# Patient Record
Sex: Male | Born: 1935 | Race: White | Hispanic: No | Smoking: Never smoker
Health system: Southern US, Community
[De-identification: ages and names within clinical notes are randomized; demographics above are authoritative.]

## PROBLEM LIST (undated history)

## (undated) DIAGNOSIS — N200 Calculus of kidney: Secondary | ICD-10-CM

## (undated) DIAGNOSIS — H9113 Presbycusis, bilateral: Secondary | ICD-10-CM

## (undated) DIAGNOSIS — I4891 Unspecified atrial fibrillation: Secondary | ICD-10-CM

## (undated) DIAGNOSIS — I509 Heart failure, unspecified: Secondary | ICD-10-CM

## (undated) DIAGNOSIS — I10 Essential (primary) hypertension: Secondary | ICD-10-CM

## (undated) DIAGNOSIS — E785 Hyperlipidemia, unspecified: Secondary | ICD-10-CM

## (undated) DIAGNOSIS — E079 Disorder of thyroid, unspecified: Secondary | ICD-10-CM

## (undated) HISTORY — DX: Disorder of thyroid, unspecified: E07.9

## (undated) HISTORY — PX: HAND ARTHROPLASTY: SHX968

## (undated) HISTORY — PX: HIP ARTHROPLASTY: SHX981

## (undated) HISTORY — DX: Unspecified atrial fibrillation: I48.91

## (undated) HISTORY — DX: Presbycusis, bilateral: H91.13

## (undated) HISTORY — DX: Hyperlipidemia, unspecified: E78.5

## (undated) HISTORY — DX: Heart failure, unspecified: I50.9

## (undated) HISTORY — PX: STENT PLACEMENT RT URETER (ARMC HX): HXRAD1255

## (undated) HISTORY — DX: Essential (primary) hypertension: I10

## (undated) HISTORY — DX: Calculus of kidney: N20.0

---

## 2005-09-14 ENCOUNTER — Other Ambulatory Visit: Payer: Self-pay

## 2005-09-14 ENCOUNTER — Emergency Department: Payer: Self-pay | Admitting: Emergency Medicine

## 2008-02-14 ENCOUNTER — Other Ambulatory Visit: Payer: Self-pay

## 2008-02-14 ENCOUNTER — Ambulatory Visit: Payer: Self-pay | Admitting: Specialist

## 2008-02-20 ENCOUNTER — Ambulatory Visit: Payer: Self-pay | Admitting: Specialist

## 2008-06-25 ENCOUNTER — Ambulatory Visit: Payer: Self-pay | Admitting: Specialist

## 2009-05-20 ENCOUNTER — Ambulatory Visit: Payer: Self-pay | Admitting: Family Medicine

## 2009-08-05 ENCOUNTER — Ambulatory Visit: Payer: Self-pay | Admitting: Specialist

## 2009-08-13 ENCOUNTER — Inpatient Hospital Stay: Payer: Self-pay | Admitting: Specialist

## 2009-09-02 ENCOUNTER — Ambulatory Visit: Payer: Self-pay | Admitting: Oncology

## 2009-09-22 ENCOUNTER — Ambulatory Visit: Payer: Self-pay | Admitting: Internal Medicine

## 2009-09-23 ENCOUNTER — Inpatient Hospital Stay: Payer: Self-pay | Admitting: Internal Medicine

## 2009-09-30 ENCOUNTER — Ambulatory Visit: Payer: Self-pay | Admitting: Oncology

## 2011-06-06 ENCOUNTER — Emergency Department: Payer: Self-pay | Admitting: Internal Medicine

## 2011-10-07 DIAGNOSIS — I10 Essential (primary) hypertension: Secondary | ICD-10-CM | POA: Diagnosis not present

## 2011-10-07 DIAGNOSIS — M542 Cervicalgia: Secondary | ICD-10-CM | POA: Diagnosis not present

## 2011-10-07 DIAGNOSIS — E785 Hyperlipidemia, unspecified: Secondary | ICD-10-CM | POA: Diagnosis not present

## 2011-10-07 DIAGNOSIS — E039 Hypothyroidism, unspecified: Secondary | ICD-10-CM | POA: Diagnosis not present

## 2011-10-26 DIAGNOSIS — I2510 Atherosclerotic heart disease of native coronary artery without angina pectoris: Secondary | ICD-10-CM | POA: Diagnosis not present

## 2011-10-26 DIAGNOSIS — R011 Cardiac murmur, unspecified: Secondary | ICD-10-CM | POA: Diagnosis not present

## 2011-10-26 DIAGNOSIS — R0609 Other forms of dyspnea: Secondary | ICD-10-CM | POA: Diagnosis not present

## 2011-10-26 DIAGNOSIS — R0989 Other specified symptoms and signs involving the circulatory and respiratory systems: Secondary | ICD-10-CM | POA: Diagnosis not present

## 2011-11-10 DIAGNOSIS — H353190 Nonexudative age-related macular degeneration, unspecified eye, stage unspecified: Secondary | ICD-10-CM | POA: Diagnosis not present

## 2012-02-11 DIAGNOSIS — I10 Essential (primary) hypertension: Secondary | ICD-10-CM | POA: Diagnosis not present

## 2012-02-11 DIAGNOSIS — E039 Hypothyroidism, unspecified: Secondary | ICD-10-CM | POA: Diagnosis not present

## 2012-02-11 DIAGNOSIS — B356 Tinea cruris: Secondary | ICD-10-CM | POA: Diagnosis not present

## 2012-02-11 DIAGNOSIS — E7800 Pure hypercholesterolemia, unspecified: Secondary | ICD-10-CM | POA: Diagnosis not present

## 2012-04-11 DIAGNOSIS — H353190 Nonexudative age-related macular degeneration, unspecified eye, stage unspecified: Secondary | ICD-10-CM | POA: Diagnosis not present

## 2012-04-27 DIAGNOSIS — R011 Cardiac murmur, unspecified: Secondary | ICD-10-CM | POA: Diagnosis not present

## 2012-04-27 DIAGNOSIS — I4891 Unspecified atrial fibrillation: Secondary | ICD-10-CM | POA: Diagnosis not present

## 2012-04-27 DIAGNOSIS — I2510 Atherosclerotic heart disease of native coronary artery without angina pectoris: Secondary | ICD-10-CM | POA: Diagnosis not present

## 2012-06-13 DIAGNOSIS — E039 Hypothyroidism, unspecified: Secondary | ICD-10-CM | POA: Diagnosis not present

## 2012-06-13 DIAGNOSIS — Z23 Encounter for immunization: Secondary | ICD-10-CM | POA: Diagnosis not present

## 2012-06-14 DIAGNOSIS — E7800 Pure hypercholesterolemia, unspecified: Secondary | ICD-10-CM | POA: Diagnosis not present

## 2012-06-14 DIAGNOSIS — E039 Hypothyroidism, unspecified: Secondary | ICD-10-CM | POA: Diagnosis not present

## 2012-09-01 ENCOUNTER — Emergency Department: Payer: Self-pay | Admitting: Emergency Medicine

## 2012-09-01 DIAGNOSIS — Z79899 Other long term (current) drug therapy: Secondary | ICD-10-CM | POA: Diagnosis not present

## 2012-09-01 DIAGNOSIS — Z8249 Family history of ischemic heart disease and other diseases of the circulatory system: Secondary | ICD-10-CM | POA: Diagnosis not present

## 2012-09-01 DIAGNOSIS — I4891 Unspecified atrial fibrillation: Secondary | ICD-10-CM | POA: Diagnosis not present

## 2012-09-01 DIAGNOSIS — G43909 Migraine, unspecified, not intractable, without status migrainosus: Secondary | ICD-10-CM | POA: Diagnosis not present

## 2012-09-01 DIAGNOSIS — R51 Headache: Secondary | ICD-10-CM | POA: Diagnosis not present

## 2012-09-01 DIAGNOSIS — Z9889 Other specified postprocedural states: Secondary | ICD-10-CM | POA: Diagnosis not present

## 2012-09-01 DIAGNOSIS — I10 Essential (primary) hypertension: Secondary | ICD-10-CM | POA: Diagnosis not present

## 2012-09-13 DIAGNOSIS — R51 Headache: Secondary | ICD-10-CM | POA: Diagnosis not present

## 2012-09-13 DIAGNOSIS — I10 Essential (primary) hypertension: Secondary | ICD-10-CM | POA: Diagnosis not present

## 2012-09-13 DIAGNOSIS — M5412 Radiculopathy, cervical region: Secondary | ICD-10-CM | POA: Diagnosis not present

## 2012-09-20 ENCOUNTER — Emergency Department: Payer: Self-pay | Admitting: Internal Medicine

## 2012-09-20 DIAGNOSIS — I4891 Unspecified atrial fibrillation: Secondary | ICD-10-CM | POA: Diagnosis not present

## 2012-09-20 DIAGNOSIS — I2510 Atherosclerotic heart disease of native coronary artery without angina pectoris: Secondary | ICD-10-CM | POA: Diagnosis not present

## 2012-09-20 DIAGNOSIS — Z95818 Presence of other cardiac implants and grafts: Secondary | ICD-10-CM | POA: Diagnosis not present

## 2012-09-20 DIAGNOSIS — IMO0002 Reserved for concepts with insufficient information to code with codable children: Secondary | ICD-10-CM | POA: Diagnosis not present

## 2012-09-20 DIAGNOSIS — Z7982 Long term (current) use of aspirin: Secondary | ICD-10-CM | POA: Diagnosis not present

## 2012-09-20 DIAGNOSIS — M25559 Pain in unspecified hip: Secondary | ICD-10-CM | POA: Diagnosis not present

## 2012-09-20 DIAGNOSIS — I10 Essential (primary) hypertension: Secondary | ICD-10-CM | POA: Diagnosis not present

## 2012-09-20 DIAGNOSIS — E7800 Pure hypercholesterolemia, unspecified: Secondary | ICD-10-CM | POA: Diagnosis not present

## 2012-09-20 DIAGNOSIS — Z79899 Other long term (current) drug therapy: Secondary | ICD-10-CM | POA: Diagnosis not present

## 2012-09-20 DIAGNOSIS — Z9889 Other specified postprocedural states: Secondary | ICD-10-CM | POA: Diagnosis not present

## 2012-09-22 DIAGNOSIS — M461 Sacroiliitis, not elsewhere classified: Secondary | ICD-10-CM | POA: Diagnosis not present

## 2012-11-21 DIAGNOSIS — I4891 Unspecified atrial fibrillation: Secondary | ICD-10-CM | POA: Diagnosis not present

## 2012-11-21 DIAGNOSIS — I2510 Atherosclerotic heart disease of native coronary artery without angina pectoris: Secondary | ICD-10-CM | POA: Diagnosis not present

## 2012-11-21 DIAGNOSIS — R0609 Other forms of dyspnea: Secondary | ICD-10-CM | POA: Diagnosis not present

## 2013-04-19 DIAGNOSIS — I10 Essential (primary) hypertension: Secondary | ICD-10-CM | POA: Diagnosis not present

## 2013-04-19 DIAGNOSIS — E7800 Pure hypercholesterolemia, unspecified: Secondary | ICD-10-CM | POA: Diagnosis not present

## 2013-04-19 DIAGNOSIS — Z23 Encounter for immunization: Secondary | ICD-10-CM | POA: Diagnosis not present

## 2013-04-19 DIAGNOSIS — E039 Hypothyroidism, unspecified: Secondary | ICD-10-CM | POA: Diagnosis not present

## 2013-05-08 DIAGNOSIS — E7800 Pure hypercholesterolemia, unspecified: Secondary | ICD-10-CM | POA: Diagnosis not present

## 2013-05-08 DIAGNOSIS — E039 Hypothyroidism, unspecified: Secondary | ICD-10-CM | POA: Diagnosis not present

## 2013-05-31 DIAGNOSIS — I4891 Unspecified atrial fibrillation: Secondary | ICD-10-CM | POA: Diagnosis not present

## 2013-05-31 DIAGNOSIS — I10 Essential (primary) hypertension: Secondary | ICD-10-CM | POA: Diagnosis not present

## 2013-06-19 DIAGNOSIS — L989 Disorder of the skin and subcutaneous tissue, unspecified: Secondary | ICD-10-CM | POA: Diagnosis not present

## 2013-06-19 DIAGNOSIS — Z0000 Encounter for general adult medical examination without abnormal findings: Secondary | ICD-10-CM | POA: Diagnosis not present

## 2013-06-19 DIAGNOSIS — Z1211 Encounter for screening for malignant neoplasm of colon: Secondary | ICD-10-CM | POA: Diagnosis not present

## 2013-06-19 DIAGNOSIS — Z125 Encounter for screening for malignant neoplasm of prostate: Secondary | ICD-10-CM | POA: Diagnosis not present

## 2013-06-19 DIAGNOSIS — Z1331 Encounter for screening for depression: Secondary | ICD-10-CM | POA: Diagnosis not present

## 2013-08-20 DIAGNOSIS — I10 Essential (primary) hypertension: Secondary | ICD-10-CM | POA: Diagnosis not present

## 2013-08-20 DIAGNOSIS — E039 Hypothyroidism, unspecified: Secondary | ICD-10-CM | POA: Diagnosis not present

## 2013-08-20 DIAGNOSIS — I4891 Unspecified atrial fibrillation: Secondary | ICD-10-CM | POA: Diagnosis not present

## 2013-12-03 DIAGNOSIS — Z961 Presence of intraocular lens: Secondary | ICD-10-CM | POA: Diagnosis not present

## 2013-12-04 DIAGNOSIS — R011 Cardiac murmur, unspecified: Secondary | ICD-10-CM | POA: Diagnosis not present

## 2013-12-04 DIAGNOSIS — I2510 Atherosclerotic heart disease of native coronary artery without angina pectoris: Secondary | ICD-10-CM | POA: Diagnosis not present

## 2013-12-04 DIAGNOSIS — I209 Angina pectoris, unspecified: Secondary | ICD-10-CM | POA: Diagnosis not present

## 2013-12-04 DIAGNOSIS — R0609 Other forms of dyspnea: Secondary | ICD-10-CM | POA: Diagnosis not present

## 2013-12-04 DIAGNOSIS — R0989 Other specified symptoms and signs involving the circulatory and respiratory systems: Secondary | ICD-10-CM | POA: Diagnosis not present

## 2013-12-19 DIAGNOSIS — R0609 Other forms of dyspnea: Secondary | ICD-10-CM | POA: Diagnosis not present

## 2013-12-19 DIAGNOSIS — R0989 Other specified symptoms and signs involving the circulatory and respiratory systems: Secondary | ICD-10-CM | POA: Diagnosis not present

## 2013-12-19 DIAGNOSIS — R011 Cardiac murmur, unspecified: Secondary | ICD-10-CM | POA: Diagnosis not present

## 2014-01-07 DIAGNOSIS — M1990 Unspecified osteoarthritis, unspecified site: Secondary | ICD-10-CM | POA: Diagnosis not present

## 2014-01-07 DIAGNOSIS — Z9181 History of falling: Secondary | ICD-10-CM | POA: Diagnosis not present

## 2014-01-07 DIAGNOSIS — E039 Hypothyroidism, unspecified: Secondary | ICD-10-CM | POA: Diagnosis not present

## 2014-01-07 DIAGNOSIS — I10 Essential (primary) hypertension: Secondary | ICD-10-CM | POA: Diagnosis not present

## 2014-01-07 DIAGNOSIS — E7800 Pure hypercholesterolemia, unspecified: Secondary | ICD-10-CM | POA: Diagnosis not present

## 2014-04-15 DIAGNOSIS — Z9181 History of falling: Secondary | ICD-10-CM | POA: Diagnosis not present

## 2014-04-22 DIAGNOSIS — M7530 Calcific tendinitis of unspecified shoulder: Secondary | ICD-10-CM | POA: Diagnosis not present

## 2014-04-22 DIAGNOSIS — Z96649 Presence of unspecified artificial hip joint: Secondary | ICD-10-CM | POA: Diagnosis not present

## 2014-05-15 DIAGNOSIS — I10 Essential (primary) hypertension: Secondary | ICD-10-CM | POA: Diagnosis not present

## 2014-05-15 DIAGNOSIS — E039 Hypothyroidism, unspecified: Secondary | ICD-10-CM | POA: Diagnosis not present

## 2014-05-15 DIAGNOSIS — Z23 Encounter for immunization: Secondary | ICD-10-CM | POA: Diagnosis not present

## 2014-05-15 DIAGNOSIS — E780 Pure hypercholesterolemia: Secondary | ICD-10-CM | POA: Diagnosis not present

## 2014-06-04 DIAGNOSIS — H3530 Unspecified macular degeneration: Secondary | ICD-10-CM | POA: Diagnosis not present

## 2014-06-04 DIAGNOSIS — H1851 Endothelial corneal dystrophy: Secondary | ICD-10-CM | POA: Diagnosis not present

## 2014-07-24 ENCOUNTER — Emergency Department: Payer: Self-pay | Admitting: Emergency Medicine

## 2014-07-24 DIAGNOSIS — R0602 Shortness of breath: Secondary | ICD-10-CM | POA: Diagnosis not present

## 2014-07-24 DIAGNOSIS — I517 Cardiomegaly: Secondary | ICD-10-CM | POA: Diagnosis not present

## 2014-07-24 DIAGNOSIS — I10 Essential (primary) hypertension: Secondary | ICD-10-CM | POA: Diagnosis not present

## 2014-07-24 DIAGNOSIS — I509 Heart failure, unspecified: Secondary | ICD-10-CM | POA: Diagnosis not present

## 2014-07-24 DIAGNOSIS — R609 Edema, unspecified: Secondary | ICD-10-CM | POA: Diagnosis not present

## 2014-07-24 LAB — CBC
HCT: 39.5 % — ABNORMAL LOW (ref 40.0–52.0)
HGB: 12.5 g/dL — ABNORMAL LOW (ref 13.0–18.0)
MCH: 29 pg (ref 26.0–34.0)
MCHC: 31.7 g/dL — AB (ref 32.0–36.0)
MCV: 91 fL (ref 80–100)
Platelet: 216 10*3/uL (ref 150–440)
RBC: 4.33 10*6/uL — AB (ref 4.40–5.90)
RDW: 15.5 % — ABNORMAL HIGH (ref 11.5–14.5)
WBC: 9 10*3/uL (ref 3.8–10.6)

## 2014-07-24 LAB — BASIC METABOLIC PANEL
Anion Gap: 9 (ref 7–16)
BUN: 16 mg/dL (ref 7–18)
CALCIUM: 9 mg/dL (ref 8.5–10.1)
CO2: 26 mmol/L (ref 21–32)
Chloride: 98 mmol/L (ref 98–107)
Creatinine: 1.09 mg/dL (ref 0.60–1.30)
EGFR (Non-African Amer.): >60
Glucose: 137 mg/dL — ABNORMAL HIGH (ref 65–99)
Osmolality: 270 (ref 275–301)
POTASSIUM: 4.6 mmol/L (ref 3.5–5.1)
SODIUM: 133 mmol/L — AB (ref 136–145)

## 2014-07-24 LAB — TROPONIN I: Troponin-I: 0.04 ng/mL

## 2014-07-24 LAB — MAGNESIUM: MAGNESIUM: 1.7 mg/dL — AB

## 2014-07-30 DIAGNOSIS — Z8679 Personal history of other diseases of the circulatory system: Secondary | ICD-10-CM | POA: Diagnosis not present

## 2014-07-30 DIAGNOSIS — E669 Obesity, unspecified: Secondary | ICD-10-CM | POA: Diagnosis not present

## 2014-07-30 DIAGNOSIS — I509 Heart failure, unspecified: Secondary | ICD-10-CM | POA: Diagnosis not present

## 2014-07-30 DIAGNOSIS — I2510 Atherosclerotic heart disease of native coronary artery without angina pectoris: Secondary | ICD-10-CM | POA: Diagnosis not present

## 2014-08-06 DIAGNOSIS — I509 Heart failure, unspecified: Secondary | ICD-10-CM | POA: Diagnosis not present

## 2014-08-27 DIAGNOSIS — E784 Other hyperlipidemia: Secondary | ICD-10-CM | POA: Diagnosis not present

## 2014-08-27 DIAGNOSIS — I2510 Atherosclerotic heart disease of native coronary artery without angina pectoris: Secondary | ICD-10-CM | POA: Diagnosis not present

## 2014-08-27 DIAGNOSIS — R0602 Shortness of breath: Secondary | ICD-10-CM | POA: Diagnosis not present

## 2014-08-27 DIAGNOSIS — I5032 Chronic diastolic (congestive) heart failure: Secondary | ICD-10-CM | POA: Diagnosis not present

## 2014-09-26 DIAGNOSIS — E780 Pure hypercholesterolemia: Secondary | ICD-10-CM | POA: Diagnosis not present

## 2014-09-26 DIAGNOSIS — Z23 Encounter for immunization: Secondary | ICD-10-CM | POA: Diagnosis not present

## 2014-09-26 DIAGNOSIS — I4891 Unspecified atrial fibrillation: Secondary | ICD-10-CM | POA: Diagnosis not present

## 2014-09-26 DIAGNOSIS — E039 Hypothyroidism, unspecified: Secondary | ICD-10-CM | POA: Diagnosis not present

## 2014-09-26 DIAGNOSIS — I10 Essential (primary) hypertension: Secondary | ICD-10-CM | POA: Diagnosis not present

## 2014-12-05 DIAGNOSIS — H3531 Nonexudative age-related macular degeneration: Secondary | ICD-10-CM | POA: Diagnosis not present

## 2015-01-28 ENCOUNTER — Encounter: Payer: Self-pay | Admitting: Family Medicine

## 2015-01-28 ENCOUNTER — Ambulatory Visit (INDEPENDENT_AMBULATORY_CARE_PROVIDER_SITE_OTHER): Payer: Medicare Other | Admitting: Family Medicine

## 2015-01-28 VITALS — BP 130/80 | HR 72 | Temp 97.9°F | Resp 16 | Ht 67.0 in | Wt 198.5 lb

## 2015-01-28 DIAGNOSIS — I10 Essential (primary) hypertension: Secondary | ICD-10-CM | POA: Insufficient documentation

## 2015-01-28 DIAGNOSIS — R011 Cardiac murmur, unspecified: Secondary | ICD-10-CM | POA: Insufficient documentation

## 2015-01-28 DIAGNOSIS — I2510 Atherosclerotic heart disease of native coronary artery without angina pectoris: Secondary | ICD-10-CM | POA: Diagnosis not present

## 2015-01-28 DIAGNOSIS — E039 Hypothyroidism, unspecified: Secondary | ICD-10-CM | POA: Insufficient documentation

## 2015-01-28 DIAGNOSIS — I4819 Other persistent atrial fibrillation: Secondary | ICD-10-CM

## 2015-01-28 DIAGNOSIS — E785 Hyperlipidemia, unspecified: Secondary | ICD-10-CM | POA: Insufficient documentation

## 2015-01-28 DIAGNOSIS — I481 Persistent atrial fibrillation: Secondary | ICD-10-CM

## 2015-01-28 DIAGNOSIS — E079 Disorder of thyroid, unspecified: Secondary | ICD-10-CM | POA: Diagnosis not present

## 2015-01-28 NOTE — Patient Instructions (Signed)
F/U 4 MO 

## 2015-01-28 NOTE — Progress Notes (Signed)
Name: Kirk Sanchez   MRN: [id number]    DOB: [birth year]   Date:01/28/2015       Progress Note  Subjective  Chief Complaint  Chief Complaint  Patient presents with  . Hypertension  . Hyperlipidemia  . Hypothyroidism    Hypertension This is a chronic problem. The current episode started more than 1 year ago. The problem is unchanged. The problem is controlled. Pertinent negatives include no blurred vision, chest pain, headaches, neck pain, orthopnea, palpitations or shortness of breath. Risk factors for coronary artery disease include diabetes mellitus, dyslipidemia, male gender and sedentary lifestyle. Past treatments include beta blockers and calcium channel blockers. The current treatment provides moderate improvement. There are no compliance problems.  Hypertensive end-organ damage includes a thyroid problem.  Hyperlipidemia This is a chronic problem. The current episode started more than 1 year ago. The problem is controlled. Recent lipid tests were reviewed and are normal. Factors aggravating his hyperlipidemia include fatty foods. Pertinent negatives include no chest pain, focal weakness, myalgias or shortness of breath. Current antihyperlipidemic treatment includes statins. The current treatment provides moderate improvement of lipids. There are no compliance problems.  Risk factors for coronary artery disease include diabetes mellitus, dyslipidemia, hypertension, male sex, a sedentary lifestyle and obesity.  Thyroid Problem Presents for follow-up visit. Patient reports no anxiety, constipation, diarrhea, palpitations, tremors or weight loss. The symptoms have been stable. His past medical history is significant for hyperlipidemia.  Heart Problem This is a chronic (Has chronic atrial fibrillation with a controlled ventricular rate) problem. The current episode started more than 1 year ago. The problem occurs constantly. The problem has been unchanged. Associated symptoms include weakness.  Pertinent negatives include no chest pain, chills, congestion, coughing, fever, headaches, myalgias, nausea, neck pain, sore throat or vomiting. Nothing aggravates the symptoms. He has tried walking for the symptoms.      Past Medical History  Diagnosis Date  . Hypertension   . Hyperlipidemia   . Thyroid disease   . Atrial fibrillation   . Kidney stone     History  Substance Use Topics  . Smoking status: Never Smoker   . Smokeless tobacco: Not on file  . Alcohol Use: No     Current outpatient prescriptions:  .  atenolol (TENORMIN) 50 MG tablet, , Disp: , Rfl:  .  furosemide (LASIX) 20 MG tablet, Take by mouth., Disp: , Rfl:  .  levothyroxine (SYNTHROID, LEVOTHROID) 25 MCG tablet, Take by mouth., Disp: , Rfl:  .  simvastatin (ZOCOR) 40 MG tablet, Take by mouth., Disp: , Rfl:   No Known Allergies  Review of Systems  Constitutional: Negative for fever, chills and weight loss.  HENT: Negative for congestion, hearing loss, sore throat and tinnitus.   Eyes: Negative for blurred vision, double vision and redness.  Respiratory: Negative for cough, hemoptysis and shortness of breath.   Cardiovascular: Negative for chest pain, palpitations, orthopnea, claudication and leg swelling.       Irregular heart rate pain or shortness of breath no syncope or dizziness  Gastrointestinal: Negative for heartburn, nausea, vomiting, diarrhea, constipation and blood in stool.  Genitourinary: Negative for dysuria, urgency, frequency and hematuria.  Musculoskeletal: Positive for joint pain. Negative for myalgias, back pain, falls and neck pain.  Skin: Negative for itching.  Neurological: Positive for weakness. Negative for dizziness, tingling, tremors, focal weakness, seizures, loss of consciousness and headaches.  Endo/Heme/Allergies: Does not bruise/bleed easily.  Psychiatric/Behavioral: Negative for depression and substance abuse. The patient is not nervous/anxious  and does not have insomnia.       Objective  Filed Vitals:   01/28/15 1110  BP: 130/80  Pulse: 72  Temp: 97.9 F (36.6 C)  TempSrc: Oral  Resp: 16  Height:  (1.702 m)  Weight: 198 lb 8 oz (90.039 kg)  SpO2: 94%     Physical Exam  Constitutional: He is oriented to person, place, and time and well-developed, well-nourished, and in no distress.  HENT:  Head: Normocephalic.  Eyes: EOM are normal. Pupils are equal, round, and reactive to light.  Neck: Normal range of motion. Neck supple. No thyromegaly present.  Cardiovascular:  No murmur heard. Irregularly irregular rate and rhythm no murmur  Pulmonary/Chest: Effort normal and breath sounds normal. No respiratory distress. He has no wheezes. He exhibits no tenderness.  Abdominal: Soft. Bowel sounds are normal.  Musculoskeletal: Normal range of motion. He exhibits no edema.  Lymphadenopathy:    He has no cervical adenopathy.  Neurological: He is alert and oriented to person, place, and time. No cranial nerve deficit. Gait normal. Coordination normal.  Skin: Skin is warm and dry. No rash noted.  Psychiatric: Mood, affect and judgment normal.      Assessment & Plan  1. Essential hypertension Controlled  2. Arteriosclerosis of coronary artery Stable  3. Disease of thyroid gland Stable  4. Cardiac murmur Stable stable  5. HLD (hyperlipidemia) Stable  6. Persistent atrial fibrillation Asymptomatic

## 2015-02-24 DIAGNOSIS — R011 Cardiac murmur, unspecified: Secondary | ICD-10-CM | POA: Diagnosis not present

## 2015-02-24 DIAGNOSIS — E784 Other hyperlipidemia: Secondary | ICD-10-CM | POA: Diagnosis not present

## 2015-02-24 DIAGNOSIS — I10 Essential (primary) hypertension: Secondary | ICD-10-CM | POA: Diagnosis not present

## 2015-02-24 DIAGNOSIS — I2510 Atherosclerotic heart disease of native coronary artery without angina pectoris: Secondary | ICD-10-CM | POA: Diagnosis not present

## 2015-06-09 ENCOUNTER — Ambulatory Visit (INDEPENDENT_AMBULATORY_CARE_PROVIDER_SITE_OTHER): Payer: Medicare Other | Admitting: Family Medicine

## 2015-06-09 ENCOUNTER — Telehealth: Payer: Self-pay | Admitting: Family Medicine

## 2015-06-09 ENCOUNTER — Encounter: Payer: Self-pay | Admitting: Family Medicine

## 2015-06-09 VITALS — BP 110/72 | HR 71 | Temp 98.0°F | Resp 16 | Ht 67.0 in | Wt 192.4 lb

## 2015-06-09 DIAGNOSIS — R238 Other skin changes: Secondary | ICD-10-CM

## 2015-06-09 DIAGNOSIS — E039 Hypothyroidism, unspecified: Secondary | ICD-10-CM | POA: Diagnosis not present

## 2015-06-09 DIAGNOSIS — E785 Hyperlipidemia, unspecified: Secondary | ICD-10-CM

## 2015-06-09 DIAGNOSIS — Z23 Encounter for immunization: Secondary | ICD-10-CM

## 2015-06-09 DIAGNOSIS — I2510 Atherosclerotic heart disease of native coronary artery without angina pectoris: Secondary | ICD-10-CM | POA: Diagnosis not present

## 2015-06-09 DIAGNOSIS — I10 Essential (primary) hypertension: Secondary | ICD-10-CM | POA: Diagnosis not present

## 2015-06-09 DIAGNOSIS — R233 Spontaneous ecchymoses: Secondary | ICD-10-CM

## 2015-06-09 DIAGNOSIS — H6092 Unspecified otitis externa, left ear: Secondary | ICD-10-CM

## 2015-06-09 MED ORDER — CIPROFLOXACIN-HYDROCORTISONE 0.2-1 % OT SUSP: 3.0000 [drp] | Freq: Two times a day (BID) | OTIC | Status: AC

## 2015-06-09 MED ORDER — ATENOLOL 50 MG PO TABS: 50.0000 mg | ORAL_TABLET | Freq: Every day | ORAL | Status: DC

## 2015-06-09 MED ORDER — LEVOTHYROXINE SODIUM 25 MCG PO TABS: 25.0000 ug | ORAL_TABLET | Freq: Every day | ORAL | Status: DC

## 2015-06-09 MED ORDER — SIMVASTATIN 40 MG PO TABS: 40.0000 mg | ORAL_TABLET | Freq: Every day | ORAL | Status: DC

## 2015-06-09 MED ORDER — FUROSEMIDE 20 MG PO TABS
20.0000 mg | ORAL_TABLET | Freq: Every day | ORAL | Status: DC
Start: 2015-06-09 — End: 2016-03-03

## 2015-06-09 NOTE — Telephone Encounter (Signed)
PER CAROL AT THE PHARM SAYS THAT THE EAR MEDICATION WILL BE 200.00 THAT WAS PRESCRIBED. THEY HAVE CORTISTORIN HAS THE SOLUTION AND SUSPENSION. WHICH ONE CAN SHE GIVE HIM.

## 2015-06-10 LAB — COMPREHENSIVE METABOLIC PANEL
ALBUMIN: 4.1 g/dL (ref 3.5–4.7)
ALT: 9 IU/L (ref 0–44)
AST: 19 IU/L (ref 0–40)
Albumin/Globulin Ratio: 1.6 (ref 1.1–2.5)
Alkaline Phosphatase: 68 IU/L (ref 39–117)
BUN / CREAT RATIO: 18 (ref 10–22)
BUN: 14 mg/dL (ref 8–27)
Bilirubin Total: 0.4 mg/dL (ref 0.0–1.2)
CHLORIDE: 100 mmol/L (ref 97–106)
CO2: 27 mmol/L (ref 18–29)
Calcium: 9.6 mg/dL (ref 8.6–10.2)
Creatinine, Ser: 0.77 mg/dL (ref 0.76–1.27)
GFR calc non Af Amer: 82 mL/min/{1.73_m2} (ref >59)
GFR, EST AFRICAN AMERICAN: 94 mL/min/{1.73_m2} (ref >59)
GLUCOSE: 97 mg/dL (ref 65–99)
Globulin, Total: 2.6 g/dL (ref 1.5–4.5)
Potassium: 5.3 mmol/L — ABNORMAL HIGH (ref 3.5–5.2)
SODIUM: 140 mmol/L (ref 136–144)
TOTAL PROTEIN: 6.7 g/dL (ref 6.0–8.5)

## 2015-06-10 LAB — LIPID PANEL
Chol/HDL Ratio: 2.7 ratio units (ref 0.0–5.0)
Cholesterol, Total: 192 mg/dL (ref 100–199)
HDL: 70 mg/dL (ref >39)
LDL CALC: 96 mg/dL (ref 0–99)
Triglycerides: 129 mg/dL (ref 0–149)
VLDL CHOLESTEROL CAL: 26 mg/dL (ref 5–40)

## 2015-06-10 LAB — TSH: TSH: 3.19 u[IU]/mL (ref 0.450–4.500)

## 2015-06-10 NOTE — Telephone Encounter (Signed)
Suspension

## 2015-06-11 NOTE — Telephone Encounter (Signed)
Spoke with Nehemiah SettleBrooke at the pharmacy and she suggested neomyacin mix,stated that it would be less expensive for pt.

## 2015-06-12 DIAGNOSIS — H6090 Unspecified otitis externa, unspecified ear: Secondary | ICD-10-CM | POA: Insufficient documentation

## 2015-06-12 NOTE — Telephone Encounter (Signed)
Okay with same directions

## 2015-06-12 NOTE — Progress Notes (Signed)
Name: Kirk Sanchez   MRN: [id number]    DOB: 1924-10-12   Date:06/12/2015       Progress Note  Subjective  Chief Complaint  Chief Complaint  Patient presents with  . Hypertension    4 month follow up  . Hypothyroidism  . Hyperlipidemia    HPI  Hypertension   Patient presents for follow-up of hypertension. It has been present for over over 5 years.  Patient states that there is compliance with medical regimen which consists of atenolol 50 mg daily and furosemide 20 mg daily . There is no end organ disease. Cardiac risk factors include hypertension hyperlipidemia and diabetes.  Exercise regimen consist of some walking.  Diet consist of sodium restriction .  Patient presents for follow-up of hypothyroidism. It has been present for over 5 years.  Current symptoms consist of none . Current medication regimen consist of levothyroxin 25 micrograms daily .   There is good compliance with regimen.    Hyperlipidemia  Patient has a history of hyperlipidemia for over 5 years.  Current medical regimen consist of simvastatin 40 mg daily at bedtime .  Compliance is good.  Diet and exercise are currently followed well .  Risk factors for cardiovascular disease include hyperlipidemia and hypertension advanced age   There have been no side effects from the medication.    Otitis externa  Complaint of pain and discomfort in the left ear the last several weeks. No drainage. No fever or chills.  Past Medical History  Diagnosis Date  . Hypertension   . Hyperlipidemia   . Thyroid disease   . Atrial fibrillation (HCC)   . Kidney stone     Social History  Substance Use Topics  . Smoking status: Never Smoker   . Smokeless tobacco: Not on file  . Alcohol Use: No     Current outpatient prescriptions:  .  atenolol (TENORMIN) 50 MG tablet, Take 1 tablet (50 mg total) by mouth daily., Disp: 30 tablet, Rfl: 5 .  ciprofloxacin-hydrocortisone (CIPRO HC) otic suspension, Place 3 drops into both ears  2 (two) times daily., Disp: 10 mL, Rfl: 0 .  furosemide (LASIX) 20 MG tablet, Take 1 tablet (20 mg total) by mouth daily., Disp: 30 tablet, Rfl: 5 .  levothyroxine (SYNTHROID, LEVOTHROID) 25 MCG tablet, Take 1 tablet (25 mcg total) by mouth daily before breakfast., Disp: 30 tablet, Rfl: 5 .  simvastatin (ZOCOR) 40 MG tablet, Take 1 tablet (40 mg total) by mouth daily at 6 PM., Disp: 30 tablet, Rfl: 5  No Known Allergies  Review of Systems  Constitutional: Negative for fever, chills and weight loss.  HENT: Positive for ear pain. Negative for congestion, hearing loss, sore throat and tinnitus.   Eyes: Negative for blurred vision, double vision and redness.  Respiratory: Negative for cough, hemoptysis and shortness of breath.   Cardiovascular: Positive for leg swelling. Negative for chest pain, palpitations, orthopnea and claudication.  Gastrointestinal: Negative for heartburn, nausea, vomiting, diarrhea, constipation and blood in stool.  Genitourinary: Negative for dysuria, urgency, frequency and hematuria.  Musculoskeletal: Positive for joint pain. Negative for myalgias, back pain, falls and neck pain.  Skin: Negative for itching.  Neurological: Negative for dizziness, tingling, tremors, focal weakness, seizures, loss of consciousness, weakness and headaches.  Endo/Heme/Allergies: Does not bruise/bleed easily.  Psychiatric/Behavioral: Negative for depression and substance abuse. The patient is not nervous/anxious and does not have insomnia.      Objective  Filed Vitals:   06/09/15 1057  BP:  110/72  Pulse: 71  Temp: 98 F (36.7 C)  TempSrc: Oral  Resp: 16  Height: 5\' 7"  (1.702 m)  Weight: 192 lb 6.4 oz (87.272 kg)  SpO2: 94%     Physical Exam  Constitutional: He is oriented to person, place, and time and well-developed, well-nourished, and in no distress.  HENT:  Head: Normocephalic.  Eyes: EOM are normal. Pupils are equal, round, and reactive to light.  Neck: Normal range  of motion. Neck supple. No thyromegaly present.  Cardiovascular: Normal rate, regular rhythm and normal heart sounds.   No murmur heard. Pulmonary/Chest: Effort normal and breath sounds normal. No respiratory distress. He has no wheezes.  Abdominal: Soft. Bowel sounds are normal.  Musculoskeletal: Normal range of motion. He exhibits edema (1+ in the lower extremities).  Lymphadenopathy:    He has no cervical adenopathy.  Neurological: He is alert and oriented to person, place, and time. No cranial nerve deficit. Gait normal. Coordination normal.  Skin: Skin is warm and dry. No rash noted.  Several areas of senile purpura on the distal extremities  Psychiatric: Affect and judgment normal.      Assessment & Plan  1. Need for influenza vaccination Given - Flu vaccine HIGH DOSE PF (Fluzone High dose)  2. Essential hypertension Well-controlled - Comprehensive Metabolic Panel (CMET)  3. Hypothyroidism, unspecified hypothyroidism type Check TSH - TSH  4. Hyperlipidemia Lipid panel and continue to monitor - Lipid panel  5. Easy bruisability Continue to monitor  6.  Otitis externa  Cortisporin Otic

## 2015-06-12 NOTE — Telephone Encounter (Signed)
Done

## 2015-08-20 DIAGNOSIS — E079 Disorder of thyroid, unspecified: Secondary | ICD-10-CM | POA: Diagnosis not present

## 2015-08-20 DIAGNOSIS — R600 Localized edema: Secondary | ICD-10-CM | POA: Diagnosis not present

## 2015-08-20 DIAGNOSIS — E784 Other hyperlipidemia: Secondary | ICD-10-CM | POA: Diagnosis not present

## 2015-08-20 DIAGNOSIS — I5032 Chronic diastolic (congestive) heart failure: Secondary | ICD-10-CM | POA: Diagnosis not present

## 2015-08-20 DIAGNOSIS — R011 Cardiac murmur, unspecified: Secondary | ICD-10-CM | POA: Diagnosis not present

## 2015-08-20 DIAGNOSIS — I2510 Atherosclerotic heart disease of native coronary artery without angina pectoris: Secondary | ICD-10-CM | POA: Diagnosis not present

## 2015-08-20 DIAGNOSIS — I10 Essential (primary) hypertension: Secondary | ICD-10-CM | POA: Diagnosis not present

## 2015-08-20 DIAGNOSIS — E669 Obesity, unspecified: Secondary | ICD-10-CM | POA: Diagnosis not present

## 2015-08-20 DIAGNOSIS — I480 Paroxysmal atrial fibrillation: Secondary | ICD-10-CM | POA: Diagnosis not present

## 2015-10-08 ENCOUNTER — Ambulatory Visit: Payer: Medicare Other | Admitting: Family Medicine

## 2015-12-04 ENCOUNTER — Other Ambulatory Visit: Payer: Self-pay | Admitting: Family Medicine

## 2016-01-07 ENCOUNTER — Other Ambulatory Visit: Payer: Self-pay | Admitting: Family Medicine

## 2016-02-07 ENCOUNTER — Other Ambulatory Visit: Payer: Self-pay | Admitting: Family Medicine

## 2016-02-17 ENCOUNTER — Other Ambulatory Visit: Payer: Self-pay | Admitting: Family Medicine

## 2016-02-17 DIAGNOSIS — J069 Acute upper respiratory infection, unspecified: Secondary | ICD-10-CM | POA: Diagnosis not present

## 2016-02-17 DIAGNOSIS — R011 Cardiac murmur, unspecified: Secondary | ICD-10-CM | POA: Diagnosis not present

## 2016-02-17 DIAGNOSIS — E669 Obesity, unspecified: Secondary | ICD-10-CM | POA: Diagnosis not present

## 2016-02-17 DIAGNOSIS — I5032 Chronic diastolic (congestive) heart failure: Secondary | ICD-10-CM | POA: Diagnosis not present

## 2016-02-17 DIAGNOSIS — E784 Other hyperlipidemia: Secondary | ICD-10-CM | POA: Diagnosis not present

## 2016-02-17 DIAGNOSIS — I10 Essential (primary) hypertension: Secondary | ICD-10-CM | POA: Diagnosis not present

## 2016-02-17 DIAGNOSIS — I480 Paroxysmal atrial fibrillation: Secondary | ICD-10-CM | POA: Diagnosis not present

## 2016-02-17 DIAGNOSIS — R600 Localized edema: Secondary | ICD-10-CM | POA: Diagnosis not present

## 2016-02-17 DIAGNOSIS — I2510 Atherosclerotic heart disease of native coronary artery without angina pectoris: Secondary | ICD-10-CM | POA: Diagnosis not present

## 2016-02-17 DIAGNOSIS — E079 Disorder of thyroid, unspecified: Secondary | ICD-10-CM | POA: Diagnosis not present

## 2016-03-03 ENCOUNTER — Encounter: Payer: Self-pay | Admitting: Family Medicine

## 2016-03-03 ENCOUNTER — Ambulatory Visit (INDEPENDENT_AMBULATORY_CARE_PROVIDER_SITE_OTHER): Payer: Medicare Other | Admitting: Family Medicine

## 2016-03-03 VITALS — BP 126/82 | HR 74 | Temp 97.8°F | Resp 16 | Wt 196.0 lb

## 2016-03-03 DIAGNOSIS — I480 Paroxysmal atrial fibrillation: Secondary | ICD-10-CM

## 2016-03-03 DIAGNOSIS — D649 Anemia, unspecified: Secondary | ICD-10-CM

## 2016-03-03 DIAGNOSIS — Z5181 Encounter for therapeutic drug level monitoring: Secondary | ICD-10-CM | POA: Insufficient documentation

## 2016-03-03 DIAGNOSIS — E079 Disorder of thyroid, unspecified: Secondary | ICD-10-CM | POA: Diagnosis not present

## 2016-03-03 DIAGNOSIS — E785 Hyperlipidemia, unspecified: Secondary | ICD-10-CM | POA: Diagnosis not present

## 2016-03-03 DIAGNOSIS — I10 Essential (primary) hypertension: Secondary | ICD-10-CM

## 2016-03-03 DIAGNOSIS — Z7189 Other specified counseling: Secondary | ICD-10-CM | POA: Insufficient documentation

## 2016-03-03 DIAGNOSIS — H9113 Presbycusis, bilateral: Secondary | ICD-10-CM | POA: Diagnosis not present

## 2016-03-03 DIAGNOSIS — I2510 Atherosclerotic heart disease of native coronary artery without angina pectoris: Secondary | ICD-10-CM

## 2016-03-03 LAB — CBC WITH DIFFERENTIAL/PLATELET
BASOS ABS: 54 {cells}/uL (ref 0–200)
Basophils Relative: 1 %
EOS ABS: 324 {cells}/uL (ref 15–500)
EOS PCT: 6 %
HEMATOCRIT: 37.5 % — AB (ref 38.5–50.0)
HEMOGLOBIN: 11.9 g/dL — AB (ref 13.2–17.1)
Lymphocytes Relative: 21 %
Lymphs Abs: 1134 cells/uL (ref 850–3900)
MCH: 29.1 pg (ref 27.0–33.0)
MCHC: 31.7 g/dL — AB (ref 32.0–36.0)
MCV: 91.7 fL (ref 80.0–100.0)
MPV: 10.8 fL (ref 7.5–12.5)
Monocytes Absolute: 648 cells/uL (ref 200–950)
Monocytes Relative: 12 %
NEUTROS PCT: 60 %
Neutro Abs: 3240 cells/uL (ref 1500–7800)
Platelets: 207 10*3/uL (ref 140–400)
RBC: 4.09 MIL/uL — ABNORMAL LOW (ref 4.20–5.80)
RDW: 14.6 % (ref 11.0–15.0)
WBC: 5.4 10*3/uL (ref 3.8–10.8)

## 2016-03-03 LAB — LIPID PANEL
Cholesterol: 159 mg/dL (ref 125–200)
HDL: 70 mg/dL (ref >=40)
LDL CALC: 69 mg/dL (ref <130)
Total CHOL/HDL Ratio: 2.3 Ratio (ref <=5.0)
Triglycerides: 101 mg/dL (ref <150)
VLDL: 20 mg/dL (ref <30)

## 2016-03-03 LAB — TSH: TSH: 2.48 m[IU]/L (ref 0.40–4.50)

## 2016-03-03 LAB — COMPREHENSIVE METABOLIC PANEL
ALBUMIN: 3.8 g/dL (ref 3.6–5.1)
ALK PHOS: 74 U/L (ref 40–115)
ALT: 10 U/L (ref 9–46)
AST: 16 U/L (ref 10–35)
BILIRUBIN TOTAL: 0.6 mg/dL (ref 0.2–1.2)
BUN: 14 mg/dL (ref 7–25)
CHLORIDE: 100 mmol/L (ref 98–110)
CO2: 30 mmol/L (ref 20–31)
CREATININE: 0.88 mg/dL (ref 0.70–1.11)
Calcium: 9.1 mg/dL (ref 8.6–10.3)
Glucose, Bld: 96 mg/dL (ref 65–99)
Potassium: 5.5 mmol/L — ABNORMAL HIGH (ref 3.5–5.3)
SODIUM: 138 mmol/L (ref 135–146)
TOTAL PROTEIN: 6.6 g/dL (ref 6.1–8.1)

## 2016-03-03 NOTE — Progress Notes (Signed)
BP 126/82   Pulse 74   Temp 97.8 F (36.6 C) (Oral)   Resp 16   Wt 196 lb (88.9 kg)   SpO2 94%   BMI 30.70 kg/m    Subjective:    Patient ID: Kirk Sanchez, male    DOB: Dec 10, 1924, 82 y.o.   MRN: [id number]  HPI: Kirk Sanchez is a 82 y.o. male  Chief Complaint  Patient presents with  . Medication Refill   Patient is new to me today; his usual provider is out of hte office for an extended time  Hypertension; well-controlled today; he does not check his BP at home; he has a heart doctor  High cholesterol; always been high  Bursitis in the right shoulder; bothers him some for years off and on; his ortho saw him and took xrays and there is somet calcium, no plans for surgery; both hips replaced  Just saw cardiologist two weeks ago; Dr. Juliann Pares; no chest pain; no leg edema; no SHOB; he has atrial fibrillation  Used the chain saw for 3 hours yesterday and every muscle in his back hurts; he is on a blood thinner (I cautioned him immediately about chain saw use on a blood thinner, risks)  Hearing loss, 30% in the right, 70% on the left; has hearing aides; inheritied  Depression screen [facility] 2/9 03/03/2016 06/09/2015  Decreased Interest 0 0  Down, Depressed, Hopeless 0 0  PHQ - 2 Score 0 0    Relevant past medical, surgical, family and social history reviewed Past Medical History:  Diagnosis Date  . Atrial fibrillation (HCC)   . Hyperlipidemia   . Hypertension   . Kidney stone   . Presbycusis of both ears 03/06/2016  . Thyroid disease    Past Surgical History:  Procedure Laterality Date  . HAND ARTHROPLASTY    . HIP ARTHROPLASTY    . STENT PLACEMENT RT URETER ([facility] HX)     No family history on file. Social History  Substance Use Topics  . Smoking status: Never Smoker  . Smokeless tobacco: Not on file  . Alcohol use No    Interim medical history since last visit reviewed. Allergies and medications reviewed  Review of Systems Per HPI unless specifically  indicated above     Objective:    BP 126/82   Pulse 74   Temp 97.8 F (36.6 C) (Oral)   Resp 16   Wt 196 lb (88.9 kg)   SpO2 94%   BMI 30.70 kg/m   Wt Readings from Last 3 Encounters:  03/03/16 196 lb (88.9 kg)  06/09/15 192 lb 6.4 oz (87.3 kg)  01/28/15 198 lb 8 oz (90 kg)    Physical Exam  Constitutional: He appears well-developed and well-nourished. No distress.  HENT:  Head: Normocephalic and atraumatic.  Right Ear: Decreased hearing is noted.  Left Ear: Decreased hearing is noted.  Mouth/Throat: Mucous membranes are normal.  Very hard of hearing  Eyes: EOM are normal. No scleral icterus.  Neck: No thyromegaly present.  Cardiovascular: Normal rate.  An irregularly irregular rhythm present.  Pulmonary/Chest: Effort normal and breath sounds normal.  Abdominal: Soft. Bowel sounds are normal. He exhibits no distension.  Musculoskeletal: He exhibits no edema.  Neurological: He is alert. He displays no tremor. Coordination normal.  Skin: Skin is warm and dry. No pallor.  Psychiatric: He has a normal mood and affect. His behavior is normal. Judgment and thought content normal. His mood appears not anxious. Cognition and memory  are not impaired. He does not exhibit a depressed mood.  Very pleasant and cooperative      Assessment & Plan:   Problem List Items Addressed This Visit      Cardiovascular and Mediastinum   BP (high blood pressure) - Primary    Continue medicines, try to limit salt      Relevant Medications   aspirin 81 MG tablet   Atrial fibrillation (HCC)    Managed by cardiologist      Relevant Medications   aspirin 81 MG tablet   Arteriosclerosis of coronary artery    Sees cardiologist; check lipids      Relevant Medications   aspirin 81 MG tablet   Other Relevant Orders   Lipid panel (Completed)     Endocrine   Disease of thyroid gland    Last TSH reviewed; check again since almost a year      Relevant Orders   TSH (Completed)      Nervous and Auditory   Presbycusis of both ears    Examiner had to speak very loudly during visit, but I believe there was good communication and understanding between patient and doctor, despite his hearing loss; extra opportunity for restatements and clarification given        Other   Medication monitoring encounter    Check sgpt on statin      Relevant Orders   Comprehensive Metabolic Panel (CMET) (Completed)   HLD (hyperlipidemia)    Check today; limit cheese      Relevant Medications   aspirin 81 MG tablet   Other Relevant Orders   Lipid panel (Completed)   Anemia    Noted on old labs from 2015; will check again      Relevant Orders   CBC with Differential/Platelet (Completed)    Other Visit Diagnoses   None.     Follow up plan: Return in about 6 months (around 09/03/2016) for fasting labs and visith with me or Dr. Thana Ates.  An after-visit summary was printed and given to the patient at check-out.  Please see the patient instructions which may contain other information and recommendations beyond what is mentioned above in the assessment and plan.  Meds ordered this encounter  Medications  . aspirin 81 MG tablet    Sig: Take 81 mg by mouth daily.    Orders Placed This Encounter  Procedures  . CBC with Differential/Platelet  . Lipid panel  . Comprehensive Metabolic Panel (CMET)  . TSH

## 2016-03-03 NOTE — Patient Instructions (Addendum)
Really try to cut down on salt Limit the cheese in your diet  Try the DASH guidelines (below)  Call with any problems We'll contact you about your labs  DASH Eating Plan DASH stands for "Dietary Approaches to Stop Hypertension." The DASH eating plan is a healthy eating plan that has been shown to reduce high blood pressure (hypertension). Additional health benefits may include reducing the risk of type 2 diabetes mellitus, heart disease, and stroke. The DASH eating plan may also help with weight loss. WHAT DO I NEED TO KNOW ABOUT THE DASH EATING PLAN? For the DASH eating plan, you will follow these general guidelines:  Choose foods with a percent daily value for sodium of less than 5% (as listed on the food label).  Use salt-free seasonings or herbs instead of table salt or sea salt.  Check with your health care provider or pharmacist before using salt substitutes.  Eat lower-sodium products, often labeled as "lower sodium" or "no salt added."  Eat fresh foods.  Eat more vegetables, fruits, and low-fat dairy products.  Choose whole grains. Look for the word "whole" as the first word in the ingredient list.  Choose fish and skinless chicken or [place] more often than red meat. Limit fish, poultry, and meat to 6 oz (170 g) each day.  Limit sweets, desserts, sugars, and sugary drinks.  Choose heart-healthy fats.  Limit cheese to 1 oz (28 g) per day.  Eat more home-cooked food and less restaurant, buffet, and fast food.  Limit fried foods.  Cook foods using methods other than frying.  Limit canned vegetables. If you do use them, rinse them well to decrease the sodium.  When eating at a restaurant, ask that your food be prepared with less salt, or no salt if possible. WHAT FOODS CAN I EAT? Seek help from a dietitian for individual calorie needs. Grains Whole grain or whole wheat bread. Brown rice. Whole grain or whole wheat pasta. Quinoa, bulgur, and whole grain cereals.  Low-sodium cereals. Corn or whole wheat flour tortillas. Whole grain cornbread. Whole grain crackers. Low-sodium crackers. Vegetables Fresh or frozen vegetables (raw, steamed, roasted, or grilled). Low-sodium or reduced-sodium tomato and vegetable juices. Low-sodium or reduced-sodium tomato sauce and paste. Low-sodium or reduced-sodium canned vegetables.  Fruits All fresh, canned (in natural juice), or frozen fruits. Meat and Other Protein Products Ground beef (85% or leaner), grass-fed beef, or beef trimmed of fat. Skinless chicken or [place]. Ground chicken or [place]. Pork trimmed of fat. All fish and seafood. Eggs. Dried beans, peas, or lentils. Unsalted nuts and seeds. Unsalted canned beans. Dairy Low-fat dairy products, such as skim or 1% milk, 2% or reduced-fat cheeses, low-fat ricotta or cottage cheese, or plain low-fat yogurt. Low-sodium or reduced-sodium cheeses. Fats and Oils Tub margarines without trans fats. Light or reduced-fat mayonnaise and salad dressings (reduced sodium). Avocado. Safflower, olive, or canola oils. Natural peanut or almond butter. Other Unsalted popcorn and pretzels. The items listed above may not be a complete list of recommended foods or beverages. Contact your dietitian for more options. WHAT FOODS ARE NOT RECOMMENDED? Grains White bread. White pasta. White rice. Refined cornbread. Bagels and croissants. Crackers that contain trans fat. Vegetables Creamed or fried vegetables. Vegetables in a cheese sauce. Regular canned vegetables. Regular canned tomato sauce and paste. Regular tomato and vegetable juices. Fruits Dried fruits. Canned fruit in light or heavy syrup. Fruit juice. Meat and Other Protein Products Fatty cuts of meat. Ribs, chicken wings, bacon, sausage, bologna, salami, chitterlings,  fatback, hot dogs, bratwurst, and packaged luncheon meats. Salted nuts and seeds. Canned beans with salt. Dairy Whole or 2% milk, cream, half-and-half, and cream  cheese. Whole-fat or sweetened yogurt. Full-fat cheeses or blue cheese. Nondairy creamers and whipped toppings. Processed cheese, cheese spreads, or cheese curds. Condiments Onion and garlic salt, seasoned salt, table salt, and sea salt. Canned and packaged gravies. Worcestershire sauce. Tartar sauce. Barbecue sauce. Teriyaki sauce. Soy sauce, including reduced sodium. Steak sauce. Fish sauce. Oyster sauce. Cocktail sauce. Horseradish. Ketchup and mustard. Meat flavorings and tenderizers. Bouillon cubes. Hot sauce. Tabasco sauce. Marinades. Taco seasonings. Relishes. Fats and Oils Butter, stick margarine, lard, shortening, ghee, and bacon fat. Coconut, palm kernel, or palm oils. Regular salad dressings. Other Pickles and olives. Salted popcorn and pretzels. The items listed above may not be a complete list of foods and beverages to avoid. Contact your dietitian for more information. WHERE CAN I FIND MORE INFORMATION? National Heart, Lung, and Blood Institute: [employer]   This information is not intended to replace advice given to you by your health care provider. Make sure you discuss any questions you have with your health care provider.   Document Released: 07/08/2011 Document Revised: 08/09/2014 Document Reviewed: 05/23/2013 Elsevier Interactive Patient Education 2016 [employer]  Cholesterol Cholesterol is a fat. Your body needs a small amount of cholesterol. Cholesterol may build up in your blood vessels. This increases your chance of having a heart attack or stroke. You cannot feel your cholesterol levels. The only way to know your cholesterol level is high is with a blood test. Keep your test results. Work with your doctor to keep your cholesterol at a good level. WHAT DO THE TEST RESULTS MEAN?  Total cholesterol is how much cholesterol is in your blood.  LDL is bad cholesterol. This is the type that can build up. You want LDL to be  low.  HDL is good cholesterol. It cleans your blood vessels and carries LDL away. You want HDL to be high.  Triglycerides are fat that the body can burn for energy or store. WHAT ARE GOOD LEVELS OF CHOLESTEROL?  Total cholesterol below 200.  LDL below 100 for people at risk. Below 70 for those at very high risk.  HDL above 50 is good. Above 60 is best.  Triglycerides below 150. HOW CAN I LOWER MY CHOLESTEROL?  Diet. Follow your diet programs as told by your doctor.  Choose fish, white meat chicken, roasted [place], or baked [place]. Try not to eat red meat, fried foods, or processed meats such as sausage and lunch meats.  Eat lots of fresh fruits and vegetables.  Choose whole grains, beans, pasta, potatoes, and cereals.  Use only small amounts of olive, corn, or canola oils.  Try not to eat butter, mayonnaise, shortening, or palm kernel oils.  Try not to eat foods with trans fats.  Drink skim or nonfat milk. Eat low-fat or nonfat yogurt and cheeses. Try not to drink whole milk or cream. Try not to eat ice cream, egg yolks, and full-fat cheeses.  Healthy desserts include angel food cake, ginger snaps, animal crackers, hard candy, popsicles, and low-fat or nonfat frozen yogurt. Try not to eat pastries, cakes, pies, and cookies.  Exercise. Follow your exercise programs as told by your doctor.  Be more active. You can try gardening, walking, or taking the stairs. Ask your doctor about how you can be more active.  Medicine. Take medicine as told by your doctor.   This information is  not intended to replace advice given to you by your health care provider. Make sure you discuss any questions you have with your health care provider.   Document Released: 10/15/2008 Document Revised: 08/09/2014 Document Reviewed: 05/02/2013 Elsevier Interactive Patient Education [employer]

## 2016-03-03 NOTE — Assessment & Plan Note (Signed)
Check today; limit cheese

## 2016-03-03 NOTE — Assessment & Plan Note (Signed)
Check sgpt on statin 

## 2016-03-03 NOTE — Assessment & Plan Note (Signed)
Continue medicines, try to limit salt

## 2016-03-03 NOTE — Assessment & Plan Note (Signed)
Sees cardiologist; check lipids

## 2016-03-03 NOTE — Assessment & Plan Note (Addendum)
Last TSH reviewed; check again since almost a year

## 2016-03-03 NOTE — Assessment & Plan Note (Signed)
Noted on old labs from 2015; will check again

## 2016-03-04 ENCOUNTER — Other Ambulatory Visit: Payer: Self-pay

## 2016-03-04 ENCOUNTER — Other Ambulatory Visit
Admission: RE | Admit: 2016-03-04 | Discharge: 2016-03-04 | Disposition: A | Discharge status: Home / Self Care | Payer: Medicare Other | Source: Ambulatory Visit | Attending: Family Medicine | Admitting: Family Medicine

## 2016-03-04 DIAGNOSIS — E875 Hyperkalemia: Secondary | ICD-10-CM | POA: Diagnosis not present

## 2016-03-04 LAB — POTASSIUM: Potassium: 4.8 mmol/L (ref 3.5–5.1)

## 2016-03-06 ENCOUNTER — Encounter: Payer: Self-pay | Admitting: Family Medicine

## 2016-03-06 DIAGNOSIS — I4891 Unspecified atrial fibrillation: Secondary | ICD-10-CM | POA: Insufficient documentation

## 2016-03-06 DIAGNOSIS — H9113 Presbycusis, bilateral: Secondary | ICD-10-CM | POA: Insufficient documentation

## 2016-03-06 HISTORY — DX: Presbycusis, bilateral: H91.13

## 2016-03-06 NOTE — Assessment & Plan Note (Signed)
Examiner had to speak very loudly during visit, but I believe there was good communication and understanding between patient and doctor, despite his hearing loss; extra opportunity for restatements and clarification given

## 2016-03-06 NOTE — Assessment & Plan Note (Signed)
Managed by cardiologist 

## 2016-04-08 ENCOUNTER — Emergency Department: Payer: Medicare Other

## 2016-04-08 ENCOUNTER — Emergency Department
Admission: EM | Admit: 2016-04-08 | Discharge: 2016-04-08 | Disposition: A | Discharge status: Other Acute Inpatient Hospital | Payer: Medicare Other | Attending: Student in an Organized Health Care Education/Training Program | Admitting: Student in an Organized Health Care Education/Training Program

## 2016-04-08 DIAGNOSIS — T18120A Food in esophagus causing compression of trachea, initial encounter: Secondary | ICD-10-CM | POA: Insufficient documentation

## 2016-04-08 DIAGNOSIS — R1314 Dysphagia, pharyngoesophageal phase: Secondary | ICD-10-CM | POA: Diagnosis not present

## 2016-04-08 DIAGNOSIS — R938 Abnormal findings on diagnostic imaging of other specified body structures: Secondary | ICD-10-CM | POA: Diagnosis not present

## 2016-04-08 DIAGNOSIS — R1310 Dysphagia, unspecified: Secondary | ICD-10-CM | POA: Diagnosis not present

## 2016-04-08 DIAGNOSIS — R9389 Abnormal findings on diagnostic imaging of other specified body structures: Secondary | ICD-10-CM

## 2016-04-08 DIAGNOSIS — Z7982 Long term (current) use of aspirin: Secondary | ICD-10-CM | POA: Diagnosis not present

## 2016-04-08 DIAGNOSIS — X58XXXA Exposure to other specified factors, initial encounter: Secondary | ICD-10-CM | POA: Insufficient documentation

## 2016-04-08 DIAGNOSIS — I10 Essential (primary) hypertension: Secondary | ICD-10-CM | POA: Insufficient documentation

## 2016-04-08 DIAGNOSIS — T18128A Food in esophagus causing other injury, initial encounter: Secondary | ICD-10-CM

## 2016-04-08 DIAGNOSIS — Z79899 Other long term (current) drug therapy: Secondary | ICD-10-CM | POA: Insufficient documentation

## 2016-04-08 DIAGNOSIS — R0989 Other specified symptoms and signs involving the circulatory and respiratory systems: Secondary | ICD-10-CM | POA: Diagnosis not present

## 2016-04-08 DIAGNOSIS — I482 Chronic atrial fibrillation: Secondary | ICD-10-CM | POA: Diagnosis not present

## 2016-04-08 DIAGNOSIS — Y939 Activity, unspecified: Secondary | ICD-10-CM | POA: Insufficient documentation

## 2016-04-08 DIAGNOSIS — M47819 Spondylosis without myelopathy or radiculopathy, site unspecified: Secondary | ICD-10-CM | POA: Diagnosis not present

## 2016-04-08 DIAGNOSIS — Z6828 Body mass index (BMI) 28.0-28.9, adult: Secondary | ICD-10-CM | POA: Diagnosis not present

## 2016-04-08 DIAGNOSIS — T18108A Unspecified foreign body in esophagus causing other injury, initial encounter: Secondary | ICD-10-CM | POA: Diagnosis not present

## 2016-04-08 DIAGNOSIS — Z955 Presence of coronary angioplasty implant and graft: Secondary | ICD-10-CM | POA: Diagnosis not present

## 2016-04-08 DIAGNOSIS — K222 Esophageal obstruction: Secondary | ICD-10-CM | POA: Diagnosis not present

## 2016-04-08 DIAGNOSIS — I2510 Atherosclerotic heart disease of native coronary artery without angina pectoris: Secondary | ICD-10-CM | POA: Diagnosis not present

## 2016-04-08 DIAGNOSIS — T189XXA Foreign body of alimentary tract, part unspecified, initial encounter: Secondary | ICD-10-CM | POA: Diagnosis not present

## 2016-04-08 DIAGNOSIS — Y999 Unspecified external cause status: Secondary | ICD-10-CM | POA: Insufficient documentation

## 2016-04-08 DIAGNOSIS — S2095XA Superficial foreign body of unspecified parts of thorax, initial encounter: Secondary | ICD-10-CM | POA: Diagnosis not present

## 2016-04-08 DIAGNOSIS — R918 Other nonspecific abnormal finding of lung field: Secondary | ICD-10-CM | POA: Diagnosis not present

## 2016-04-08 DIAGNOSIS — Y929 Unspecified place or not applicable: Secondary | ICD-10-CM | POA: Diagnosis not present

## 2016-04-08 DIAGNOSIS — Z96643 Presence of artificial hip joint, bilateral: Secondary | ICD-10-CM | POA: Diagnosis not present

## 2016-04-08 DIAGNOSIS — E039 Hypothyroidism, unspecified: Secondary | ICD-10-CM | POA: Diagnosis not present

## 2016-04-08 LAB — COMPREHENSIVE METABOLIC PANEL
ALK PHOS: 61 U/L (ref 38–126)
ALT: 17 U/L (ref 17–63)
ANION GAP: 8 (ref 5–15)
AST: 23 U/L (ref 15–41)
Albumin: 4.4 g/dL (ref 3.5–5.0)
BUN: 15 mg/dL (ref 6–20)
CALCIUM: 9.5 mg/dL (ref 8.9–10.3)
CO2: 30 mmol/L (ref 22–32)
Chloride: 99 mmol/L — ABNORMAL LOW (ref 101–111)
Creatinine, Ser: 0.71 mg/dL (ref 0.61–1.24)
GFR calc non Af Amer: >60 mL/min (ref >60)
Glucose, Bld: 102 mg/dL — ABNORMAL HIGH (ref 65–99)
POTASSIUM: 4.5 mmol/L (ref 3.5–5.1)
SODIUM: 137 mmol/L (ref 135–145)
Total Bilirubin: 1.1 mg/dL (ref 0.3–1.2)
Total Protein: 7.5 g/dL (ref 6.5–8.1)

## 2016-04-08 LAB — CBC
HCT: 39.1 % — ABNORMAL LOW (ref 40.0–52.0)
HEMOGLOBIN: 13 g/dL (ref 13.0–18.0)
MCH: 30 pg (ref 26.0–34.0)
MCHC: 33.3 g/dL (ref 32.0–36.0)
MCV: 90.1 fL (ref 80.0–100.0)
PLATELETS: 188 10*3/uL (ref 150–440)
RBC: 4.34 MIL/uL — ABNORMAL LOW (ref 4.40–5.90)
RDW: 14.8 % — AB (ref 11.5–14.5)
WBC: 7.2 10*3/uL (ref 3.8–10.6)

## 2016-04-08 LAB — TROPONIN I: TROPONIN I: 0.04 ng/mL — AB (ref <0.03)

## 2016-04-08 MED ORDER — GLUCAGON HCL (RDNA) 1 MG IJ SOLR
1.0000 mg | Freq: Once | INTRAMUSCULAR | Status: AC
  Administered 2016-04-08: 1 mg via INTRAVENOUS
  Filled 2016-04-08 (×2): qty 1

## 2016-04-08 MED ORDER — ASPIRIN 300 MG RE SUPP
300.0000 mg | Freq: Once | RECTAL | Status: AC
  Administered 2016-04-08: 300 mg via RECTAL
  Filled 2016-04-08: qty 1

## 2016-04-08 MED ORDER — LABETALOL HCL 5 MG/ML IV SOLN
5.0000 mg | INTRAVENOUS | Status: DC | PRN
  Filled 2016-04-08: qty 4

## 2016-04-08 MED ORDER — ONDANSETRON HCL 4 MG/2ML IJ SOLN
4.0000 mg | Freq: Once | INTRAMUSCULAR | Status: AC
  Administered 2016-04-08: 4 mg via INTRAVENOUS
  Filled 2016-04-08: qty 2

## 2016-04-08 NOTE — ED Provider Notes (Signed)
[facility] Emergency Department Provider Note    First MD Initiated Contact with Patient 04/08/16 1601     (approximate)  I have reviewed the triage vital signs and the nursing notes.   HISTORY  Chief Complaint Dysphagia and Foreign Body    HPI Kirk Sanchez is a 82 y.o. male  with history of dysphagia and Schatzki ring presents with roughly 24 hours of dysphagia and inability to tolerate solid foods after patient ate pork rinds last night. Patient saw the food and felt immediate chest pressure and pain with swallowing. Since then he's been able to slowly passed small amounts of liquid but has had discomfort every time he swallows. No emesis. No shortness of breath. No chest pressure. No abdominal pain. He is a nonsmoker.   Past Medical History:  Diagnosis Date  . Atrial fibrillation (HCC)   . Hyperlipidemia   . Hypertension   . Kidney stone   . Presbycusis of both ears 03/06/2016  . Thyroid disease     Patient Active Problem List   Diagnosis Date Noted  . Atrial fibrillation (HCC) 03/06/2016  . Presbycusis of both ears 03/06/2016  . Medication monitoring encounter 03/03/2016  . Anemia 03/03/2016  . Otitis externa 06/12/2015  . Arteriosclerosis of coronary artery 01/28/2015  . Cardiac murmur 01/28/2015  . HLD (hyperlipidemia) 01/28/2015  . BP (high blood pressure) 01/28/2015  . Disease of thyroid gland 01/28/2015    Past Surgical History:  Procedure Laterality Date  . HAND ARTHROPLASTY    . HIP ARTHROPLASTY    . STENT PLACEMENT RT URETER ([facility] HX)      Prior to Admission medications   Medication Sig Start Date End Date Taking? Authorizing Provider  aspirin 81 MG tablet Take 81 mg by mouth daily.    Historical Provider, MD  atenolol (TENORMIN) 50 MG tablet TAKE 1 TABLET BY MOUTH ONCE DAILY. 01/07/16   Dennison MascotLemont Morrisey, MD  levothyroxine (SYNTHROID, LEVOTHROID) 25 MCG tablet TAKE 1 TABLET BY MOUTH ONCE DAILY ON AN EMPTY STOMACH. WAIT 30  MINUTES BEFORE TAKING OTHER MEDS. 12/04/15   Dennison MascotLemont Morrisey, MD  simvastatin (ZOCOR) 40 MG tablet TAKE 1 TABLET BY MOUTH ONCE DAILY AT [facility]6PM 12/04/15   Dennison MascotLemont Morrisey, MD    Allergies Review of patient's allergies indicates no known allergies.  FMH: no h/o cancer  Social History Social History  Substance Use Topics  . Smoking status: Never Smoker  . Smokeless tobacco: Not on file  . Alcohol use No    Review of Systems Patient denies headaches, rhinorrhea, blurry vision, numbness, shortness of breath, chest pain, edema, cough, abdominal pain, nausea, vomiting, diarrhea, dysuria, fevers, rashes or hallucinations unless otherwise stated above in HPI. ____________________________________________   PHYSICAL EXAM:  VITAL SIGNS: Vitals:   04/08/16 1236  BP: 104/85  Pulse: 81  Resp: 17  Temp: 97.4 F (36.3 C)    Constitutional: Alert and oriented. Well appearing and in no acute distress. Eyes: Conjunctivae are normal. PERRL. EOMI. Head: Atraumatic. Nose: No congestion/rhinnorhea. Mouth/Throat: Mucous membranes are moist.  Oropharynx non-erythematous. Neck: No stridor. Painless ROM. No cervical spine tenderness to palpation Hematological/Lymphatic/Immunilogical: No cervical lymphadenopathy. Cardiovascular: Normal rate, regular rhythm. Grossly normal heart sounds.  Good peripheral circulation. Respiratory: Normal respiratory effort.  No retractions. Lungs CTAB. Gastrointestinal: Soft and nontender. No distention. No abdominal bruits. No CVA tenderness. Genitourinary:  Musculoskeletal: No lower extremity tenderness nor edema.  No joint effusions. Neurologic:  Normal speech and language. No gross focal neurologic deficits are  appreciated. No gait instability. Skin:  Skin is warm, dry and intact. No rash noted. Psychiatric: Mood and affect are normal. Speech and behavior are normal.  ____________________________________________   LABS (all labs ordered are listed, but only  abnormal results are displayed)  Results for orders placed or performed during the hospital encounter of 04/08/16 (from the past 24 hour(s))  CBC     Status: Abnormal   Collection Time: 04/08/16 12:44 PM  Result Value Ref Range   WBC 7.2 3.8 - 10.6 K/uL   RBC 4.34 (L) 4.40 - 5.90 MIL/uL   Hemoglobin 13.0 13.0 - 18.0 g/dL   HCT 16.1 (L) 09.6 - 04.5 %   MCV 90.1 80.0 - 100.0 fL   MCH 30.0 26.0 - 34.0 pg   MCHC 33.3 32.0 - 36.0 g/dL   RDW 40.9 (H) 81.1 - 91.4 %   Platelets 188 150 - 440 K/uL  Comprehensive metabolic panel     Status: Abnormal   Collection Time: 04/08/16 12:44 PM  Result Value Ref Range   Sodium 137 135 - 145 mmol/L   Potassium 4.5 3.5 - 5.1 mmol/L   Chloride 99 (L) 101 - 111 mmol/L   CO2 30 22 - 32 mmol/L   Glucose, Bld 102 (H) 65 - 99 mg/dL   BUN 15 6 - 20 mg/dL   Creatinine, Ser 7.82 0.61 - 1.24 mg/dL   Calcium 9.5 8.9 - 95.6 mg/dL   Total Protein 7.5 6.5 - 8.1 g/dL   Albumin 4.4 3.5 - 5.0 g/dL   AST 23 15 - 41 U/L   ALT 17 17 - 63 U/L   Alkaline Phosphatase 61 38 - 126 U/L   Total Bilirubin 1.1 0.3 - 1.2 mg/dL   GFR calc non Af Amer >60 >60 mL/min   GFR calc Af Amer >60 >60 mL/min   Anion gap 8 5 - 15  Troponin I     Status: Abnormal   Collection Time: 04/08/16 12:44 PM  Result Value Ref Range   Troponin I 0.04 (HH) <0.03 ng/mL   ____________________________________________  EKG My review and personal interpretation at Time: 17:25   Indication: chest pain  Rate: 80  Rhythm: a fib Axis: left Other: intraventricular conduction delay ____________________________________________  RADIOLOGY  CT imaging: IMPRESSION: Mildly dilated contrast filled esophagus, with small rounded filling defect measuring fat attenuation at the gastroesophageal junction. This is suspicious for impacted piece of food, with mass considered less likely given patient's history. Upper endoscopy recommended for further  evaluation. ____________________________________________   PROCEDURES  Procedure(s) performed: none    Critical Care performed: no ____________________________________________   INITIAL IMPRESSION / ASSESSMENT AND PLAN / ED COURSE  Pertinent labs & imaging results that were available during my care of the patient were reviewed by me and considered in my medical decision making (see chart for details).  DDX: Fb< esophagitis, mass, acs  ALBY SCHWABE is a 82 y.o. who presents to the ED with 24 hours of dysphasia. Patient without any acute respiratory distress but does have history which will put him at increased risk for esophageal foreign body. He is otherwise hemodynamic stable. We'll try initial conservative management with glucagon and Zofran. Currently we do not have GI coverage.  Does not appear to be suffering from a complete obstruction as he is tolerating his secretions and in no distress. We will proceed with CT imaging to evaluate for any mass.  Clinical Course  Comment By Time  CT imaging is concerning for food  bolus impaction. I have consult to come and transfer center for urgent endoscopy. Willy Eddy, MD 09/07 1741  Mild elevation of troponin noted. Patient does have EKG changes from previous dated 2015 but multiple cardiology visits with known CAD and congestive heart failure. Not consistent with a new left bundle branch block.  Denies any chest pain or pressure. Will give rectal aspirin and control blood pressure. Currently awaiting word from transfer center. Willy Eddy, MD 09/07 1755  Spoke with the ER physician Dr. Debarah Crape at [facility] who agrees to accept patient for GI consultation. She is not aware of the troponin elevation and the patient's presentation.  Have discussed with the patient and available family all diagnostics and treatments performed thus far and all questions were answered to the best of my ability. The patient demonstrates understanding and  agreement with plan.  Willy Eddy, MD 09/07 1810     ____________________________________________   FINAL CLINICAL IMPRESSION(S) / ED DIAGNOSES  Final diagnoses:  Food impaction of esophagus, initial encounter  Dysphagia  Abnormal CT of the chest      NEW MEDICATIONS STARTED DURING THIS VISIT:  New Prescriptions   No medications on file     Note:  This document was prepared using Dragon voice recognition software and may include unintentional dictation errors.    Willy Eddy, MD 04/08/16 2030

## 2016-04-08 NOTE — ED Triage Notes (Signed)
Pt arrives with reports of eating breakfast on Tuesday am which included eggs with cheese a piece of pork  Pt believes that the pork is stuck and has not gone down to his stomach due to he has not been able to eat or drink ever since   Pt reports emesis with all po intake even his daily medication

## 2016-04-08 NOTE — ED Notes (Signed)
Vomited up po contrast.  Patient remains AAOx3.  Skin warm and dry. No SOB/ DOE.  Continue to monitor.

## 2016-04-08 NOTE — ED Notes (Signed)
Called [facility] transfer center  Spoke to Liz Claiborneshley  [phone]

## 2016-04-08 NOTE — ED Notes (Signed)
AAOx3.  Skin warm and dry.  No sob/DOE.  Report called to Shanda BumpsJessica, RN-- Change Nurse [facility].Marland Kitchen..Marland Kitchen

## 2016-04-08 NOTE — ED Notes (Signed)
Called [facility] for consult and transfer spoke to [place]groveRhonda at [id number]

## 2016-04-29 ENCOUNTER — Other Ambulatory Visit: Payer: Self-pay | Admitting: Family Medicine

## 2016-05-03 DIAGNOSIS — R1314 Dysphagia, pharyngoesophageal phase: Secondary | ICD-10-CM | POA: Diagnosis not present

## 2016-05-03 DIAGNOSIS — E7800 Pure hypercholesterolemia, unspecified: Secondary | ICD-10-CM | POA: Diagnosis not present

## 2016-05-03 DIAGNOSIS — K222 Esophageal obstruction: Secondary | ICD-10-CM | POA: Diagnosis not present

## 2016-05-03 DIAGNOSIS — R1310 Dysphagia, unspecified: Secondary | ICD-10-CM | POA: Diagnosis not present

## 2016-05-03 DIAGNOSIS — E079 Disorder of thyroid, unspecified: Secondary | ICD-10-CM | POA: Diagnosis not present

## 2016-05-03 DIAGNOSIS — Z96643 Presence of artificial hip joint, bilateral: Secondary | ICD-10-CM | POA: Diagnosis not present

## 2016-05-03 DIAGNOSIS — K219 Gastro-esophageal reflux disease without esophagitis: Secondary | ICD-10-CM | POA: Diagnosis not present

## 2016-05-03 DIAGNOSIS — Z79899 Other long term (current) drug therapy: Secondary | ICD-10-CM | POA: Diagnosis not present

## 2016-05-03 DIAGNOSIS — I10 Essential (primary) hypertension: Secondary | ICD-10-CM | POA: Diagnosis not present

## 2016-05-03 DIAGNOSIS — I2510 Atherosclerotic heart disease of native coronary artery without angina pectoris: Secondary | ICD-10-CM | POA: Diagnosis not present

## 2016-05-03 DIAGNOSIS — Z955 Presence of coronary angioplasty implant and graft: Secondary | ICD-10-CM | POA: Diagnosis not present

## 2016-05-04 NOTE — Telephone Encounter (Signed)
Patient has been completely out for about a week. Please send simvastatin, levothyroxine and atenolol to [facility].

## 2016-05-05 ENCOUNTER — Telehealth: Payer: Self-pay

## 2016-05-05 NOTE — Telephone Encounter (Signed)
[facility] called states atenolol is on back order is there anything else you can prescribe him?

## 2016-05-05 NOTE — Telephone Encounter (Signed)
I'd like to defer this change to his cardiologist; please ask pharmacy to contact his cardiologist; thank you

## 2016-05-05 NOTE — Telephone Encounter (Signed)
Pharmacy notified.

## 2016-08-24 DIAGNOSIS — E784 Other hyperlipidemia: Secondary | ICD-10-CM | POA: Diagnosis not present

## 2016-08-24 DIAGNOSIS — E669 Obesity, unspecified: Secondary | ICD-10-CM | POA: Diagnosis not present

## 2016-08-24 DIAGNOSIS — I2510 Atherosclerotic heart disease of native coronary artery without angina pectoris: Secondary | ICD-10-CM | POA: Diagnosis not present

## 2016-08-24 DIAGNOSIS — E079 Disorder of thyroid, unspecified: Secondary | ICD-10-CM | POA: Diagnosis not present

## 2016-08-24 DIAGNOSIS — R011 Cardiac murmur, unspecified: Secondary | ICD-10-CM | POA: Diagnosis not present

## 2016-08-24 DIAGNOSIS — I10 Essential (primary) hypertension: Secondary | ICD-10-CM | POA: Diagnosis not present

## 2016-08-24 DIAGNOSIS — Z955 Presence of coronary angioplasty implant and graft: Secondary | ICD-10-CM | POA: Diagnosis not present

## 2016-08-24 DIAGNOSIS — I5032 Chronic diastolic (congestive) heart failure: Secondary | ICD-10-CM | POA: Diagnosis not present

## 2016-08-24 DIAGNOSIS — J069 Acute upper respiratory infection, unspecified: Secondary | ICD-10-CM | POA: Diagnosis not present

## 2016-08-24 DIAGNOSIS — R600 Localized edema: Secondary | ICD-10-CM | POA: Diagnosis not present

## 2016-08-24 DIAGNOSIS — I480 Paroxysmal atrial fibrillation: Secondary | ICD-10-CM | POA: Diagnosis not present

## 2016-09-03 ENCOUNTER — Ambulatory Visit: Payer: Medicare Other | Admitting: Family Medicine

## 2016-09-08 ENCOUNTER — Encounter: Payer: Self-pay | Admitting: Family Medicine

## 2016-09-08 ENCOUNTER — Ambulatory Visit (INDEPENDENT_AMBULATORY_CARE_PROVIDER_SITE_OTHER): Payer: Medicare Other | Admitting: Family Medicine

## 2016-09-08 VITALS — BP 130/74 | HR 76 | Temp 98.2°F | Resp 14 | Wt 195.4 lb

## 2016-09-08 DIAGNOSIS — Z5181 Encounter for therapeutic drug level monitoring: Secondary | ICD-10-CM | POA: Diagnosis not present

## 2016-09-08 DIAGNOSIS — E039 Hypothyroidism, unspecified: Secondary | ICD-10-CM

## 2016-09-08 DIAGNOSIS — E782 Mixed hyperlipidemia: Secondary | ICD-10-CM | POA: Diagnosis not present

## 2016-09-08 DIAGNOSIS — D649 Anemia, unspecified: Secondary | ICD-10-CM | POA: Diagnosis not present

## 2016-09-08 DIAGNOSIS — Z23 Encounter for immunization: Secondary | ICD-10-CM

## 2016-09-08 MED ORDER — LEVOTHYROXINE SODIUM 25 MCG PO TABS: ORAL_TABLET | ORAL | 5 refills | Status: DC

## 2016-09-08 MED ORDER — SIMVASTATIN 40 MG PO TABS: ORAL_TABLET | ORAL | 5 refills | Status: DC

## 2016-09-08 MED ORDER — RANITIDINE HCL 150 MG PO TABS: 150.0000 mg | ORAL_TABLET | Freq: Two times a day (BID) | ORAL | 5 refills | Status: DC

## 2016-09-08 NOTE — Assessment & Plan Note (Signed)
Monitor, likely anemia of chronic disease

## 2016-09-08 NOTE — Assessment & Plan Note (Signed)
Monitor sgpt on statin 

## 2016-09-08 NOTE — Assessment & Plan Note (Signed)
Check tsh today 

## 2016-09-08 NOTE — Progress Notes (Signed)
BP 130/74   Pulse 76   Temp 98.2 F (36.8 C) (Oral)   Resp 14   Wt 195 lb 6 oz (88.6 kg)   SpO2 95%   BMI 29.71 kg/m    Subjective:    Patient ID: Kirk Sanchez Bohanon, male    DOB: 04-Nov-1924, 83 y.o.   MRN: [id number]  HPI: Kirk Sanchez Vento is a 83 y.o. male  Chief Complaint  Patient presents with  . Medication Refill   No problems, feeling good He has hypothyroidism; needs a refill of his medicine; weight overall stable GERD; he has not been taking the PPI; he would like to have something just in case it's needed in the future Hyperlipidemia; taking statin; no complaints on that He has bursitis in the right shoulder; has to use the left hand to help get his right arm above his head; he has seen Dr. Hyacinth MeekerMiller (ortho) Coronary artery disease; sees cardiologist; on aspirin and beta-blocker; denies chest pain  Depression screen [facility] 2/9 09/08/2016 03/03/2016 06/09/2015  Decreased Interest 0 0 0  Down, Depressed, Hopeless 0 0 0  PHQ - 2 Score 0 0 0   Relevant past medical, surgical, family and social history reviewed Past Medical History:  Diagnosis Date  . Atrial fibrillation (HCC)   . Hyperlipidemia   . Hypertension   . Kidney stone   . Presbycusis of both ears 03/06/2016  . Thyroid disease    Past Surgical History:  Procedure Laterality Date  . HAND ARTHROPLASTY    . HIP ARTHROPLASTY    . STENT PLACEMENT RT URETER ([facility] HX)     Family History  Problem Relation Age of Onset  . Cancer Mother     stomach  . Asthma Father   . Stroke Neg Hx   . Heart disease Neg Hx     Social History  Substance Use Topics  . Smoking status: Never Smoker  . Smokeless tobacco: Never Used  . Alcohol use No   Interim medical history since last visit reviewed. Allergies and medications reviewed  Review of Systems Per HPI unless specifically indicated above     Objective:    BP 130/74   Pulse 76   Temp 98.2 F (36.8 C) (Oral)   Resp 14   Wt 195 lb 6 oz (88.6 kg)   SpO2 95%   BMI  29.71 kg/m   Wt Readings from Last 3 Encounters:  09/08/16 195 lb 6 oz (88.6 kg)  04/08/16 190 lb (86.2 kg)  03/03/16 196 lb (88.9 kg)    Physical Exam  Constitutional: He appears well-developed and well-nourished. No distress.  HENT:  Head: Normocephalic and atraumatic.  Right Ear: Decreased hearing is noted.  Left Ear: Decreased hearing is noted.  Mouth/Throat: Mucous membranes are normal.  Very hard of hearing  Eyes: EOM are normal. No scleral icterus.  Neck: No thyromegaly present.  Cardiovascular: Normal rate.  An irregularly irregular rhythm present.  Pulmonary/Chest: Effort normal and breath sounds normal.  Abdominal: Soft. Bowel sounds are normal. He exhibits no distension.  Musculoskeletal: He exhibits no edema.  Neurological: He is alert. He displays no tremor. Coordination normal.  Skin: Skin is warm and dry. No pallor.  Psychiatric: He has a normal mood and affect. His behavior is normal. Judgment and thought content normal. His mood appears not anxious. Cognition and memory are not impaired. He does not exhibit a depressed mood.  Very pleasant and cooperative    Results for orders placed or performed  during the hospital encounter of 04/08/16  CBC  Result Value Ref Range   WBC 7.2 3.8 - 10.6 K/uL   RBC 4.34 (L) 4.40 - 5.90 MIL/uL   Hemoglobin 13.0 13.0 - 18.0 g/dL   HCT 16.1 (L) 09.6 - 04.5 %   MCV 90.1 80.0 - 100.0 fL   MCH 30.0 26.0 - 34.0 pg   MCHC 33.3 32.0 - 36.0 g/dL   RDW 40.9 (Sanchez) 81.1 - 91.4 %   Platelets 188 150 - 440 K/uL  Comprehensive metabolic panel  Result Value Ref Range   Sodium 137 135 - 145 mmol/L   Potassium 4.5 3.5 - 5.1 mmol/L   Chloride 99 (L) 101 - 111 mmol/L   CO2 30 22 - 32 mmol/L   Glucose, Bld 102 (Sanchez) 65 - 99 mg/dL   BUN 15 6 - 20 mg/dL   Creatinine, Ser 7.82 0.61 - 1.24 mg/dL   Calcium 9.5 8.9 - 95.6 mg/dL   Total Protein 7.5 6.5 - 8.1 g/dL   Albumin 4.4 3.5 - 5.0 g/dL   AST 23 15 - 41 U/L   ALT 17 17 - 63 U/L   Alkaline  Phosphatase 61 38 - 126 U/L   Total Bilirubin 1.1 0.3 - 1.2 mg/dL   GFR calc non Af Amer >60 >60 mL/min   GFR calc Af Amer >60 >60 mL/min   Anion gap 8 5 - 15  Troponin I  Result Value Ref Range   Troponin I 0.04 (HH) <0.03 ng/mL      Assessment & Plan:   Problem List Items Addressed This Visit      Endocrine   Hypothyroidism    Check tsh today      Relevant Medications   metoprolol succinate (TOPROL-XL) 50 MG 24 hr tablet   levothyroxine (SYNTHROID, LEVOTHROID) 25 MCG tablet   Other Relevant Orders   TSH     Other   Medication monitoring encounter    Monitor sgpt on statin      Relevant Orders   COMPLETE METABOLIC PANEL WITH GFR   HLD (hyperlipidemia) - Primary    Check lipids; continue statin      Relevant Medications   metoprolol succinate (TOPROL-XL) 50 MG 24 hr tablet   simvastatin (ZOCOR) 40 MG tablet   Other Relevant Orders   Lipid panel   Anemia    Monitor, likely anemia of chronic disease      Relevant Orders   CBC with Differential/Platelet    Other Visit Diagnoses    Needs flu shot       Relevant Orders   Flu vaccine HIGH DOSE PF (Fluzone High dose) (Completed)       Follow up plan: Return in about 6 months (around 03/08/2017) for follow-up and fasting labs.  An after-visit summary was printed and given to the patient at check-out.  Please see the patient instructions which may contain other information and recommendations beyond what is mentioned above in the assessment and plan.  Meds ordered this encounter  Medications  . Calcium Carb-Cholecalciferol (CALCIUM-VITAMIN D) 500-200 MG-UNIT tablet    Sig: Take by mouth.  . DISCONTD: esomeprazole (NEXIUM) 20 MG capsule    Sig: Take 20 mg by mouth.  . metoprolol succinate (TOPROL-XL) 50 MG 24 hr tablet    Sig: Take by mouth.  . ranitidine (ZANTAC) 150 MG tablet    Sig: Take 1 tablet (150 mg total) by mouth 2 (two) times daily. If needed for heartburn  Dispense:  60 tablet    Refill:  5  .  simvastatin (ZOCOR) 40 MG tablet    Sig: One by mouth every evening    Dispense:  30 tablet    Refill:  5  . levothyroxine (SYNTHROID, LEVOTHROID) 25 MCG tablet    Sig: TAKE 1 TABLET BY MOUTH ONCE DAILY ON AN EMPTY STOMACH. WAIT 30 MINUTES BEFORE TAKING OTHER MEDS.    Dispense:  30 tablet    Refill:  5    Orders Placed This Encounter  Procedures  . Flu vaccine HIGH DOSE PF (Fluzone High dose)  . TSH  . COMPLETE METABOLIC PANEL WITH GFR  . CBC with Differential/Platelet  . Lipid panel

## 2016-09-08 NOTE — Patient Instructions (Addendum)
We'll get labs today and contact you about the results You received the flu shot today; it should protect you against the flu virus over the coming months; it will take about two weeks for antibodies to develop; do try to stay away from hospitals, nursing homes, and daycares during peak flu season; taking extra vitamin C daily during flu season may help you avoid getting sick

## 2016-09-08 NOTE — Assessment & Plan Note (Signed)
Check lipids; continue statin 

## 2016-09-09 LAB — COMPLETE METABOLIC PANEL WITH GFR
ALT: 12 U/L (ref 9–46)
AST: 20 U/L (ref 10–35)
Albumin: 4.1 g/dL (ref 3.6–5.1)
Alkaline Phosphatase: 59 U/L (ref 40–115)
BUN: 14 mg/dL (ref 7–25)
CALCIUM: 9.2 mg/dL (ref 8.6–10.3)
CHLORIDE: 100 mmol/L (ref 98–110)
CO2: 30 mmol/L (ref 20–31)
CREATININE: 1.05 mg/dL (ref 0.70–1.11)
GFR, EST AFRICAN AMERICAN: 72 mL/min (ref >=60)
GFR, EST NON AFRICAN AMERICAN: 63 mL/min (ref >=60)
Glucose, Bld: 86 mg/dL (ref 65–99)
POTASSIUM: 5 mmol/L (ref 3.5–5.3)
Sodium: 138 mmol/L (ref 135–146)
Total Bilirubin: 0.6 mg/dL (ref 0.2–1.2)
Total Protein: 6.5 g/dL (ref 6.1–8.1)

## 2016-09-09 LAB — CBC WITH DIFFERENTIAL/PLATELET
BASOS PCT: 0 %
Basophils Absolute: 0 cells/uL (ref 0–200)
Eosinophils Absolute: 165 cells/uL (ref 15–500)
Eosinophils Relative: 3 %
HEMATOCRIT: 39.8 % (ref 38.5–50.0)
Hemoglobin: 12.5 g/dL — ABNORMAL LOW (ref 13.2–17.1)
Lymphocytes Relative: 28 %
Lymphs Abs: 1540 cells/uL (ref 850–3900)
MCH: 28.5 pg (ref 27.0–33.0)
MCHC: 31.4 g/dL — ABNORMAL LOW (ref 32.0–36.0)
MCV: 90.7 fL (ref 80.0–100.0)
MONO ABS: 605 {cells}/uL (ref 200–950)
MPV: 10.9 fL (ref 7.5–12.5)
Monocytes Relative: 11 %
NEUTROS ABS: 3190 {cells}/uL (ref 1500–7800)
Neutrophils Relative %: 58 %
PLATELETS: 200 10*3/uL (ref 140–400)
RBC: 4.39 MIL/uL (ref 4.20–5.80)
RDW: 14.6 % (ref 11.0–15.0)
WBC: 5.5 10*3/uL (ref 3.8–10.8)

## 2016-09-09 LAB — LIPID PANEL
CHOL/HDL RATIO: 2.4 ratio (ref <5.0)
Cholesterol: 165 mg/dL (ref <200)
HDL: 69 mg/dL (ref >40)
LDL CALC: 76 mg/dL (ref <100)
TRIGLYCERIDES: 99 mg/dL (ref <150)
VLDL: 20 mg/dL (ref <30)

## 2016-09-09 LAB — TSH: TSH: 3.11 m[IU]/L (ref 0.40–4.50)

## 2016-10-01 ENCOUNTER — Other Ambulatory Visit: Payer: Self-pay

## 2016-10-01 MED ORDER — SIMVASTATIN 40 MG PO TABS: ORAL_TABLET | ORAL | 0 refills | Status: DC

## 2016-10-01 NOTE — Telephone Encounter (Signed)
90 day supply

## 2017-01-24 DIAGNOSIS — H353121 Nonexudative age-related macular degeneration, left eye, early dry stage: Secondary | ICD-10-CM | POA: Diagnosis not present

## 2017-01-24 DIAGNOSIS — H353112 Nonexudative age-related macular degeneration, right eye, intermediate dry stage: Secondary | ICD-10-CM | POA: Diagnosis not present

## 2017-02-17 DIAGNOSIS — R011 Cardiac murmur, unspecified: Secondary | ICD-10-CM | POA: Diagnosis not present

## 2017-02-17 DIAGNOSIS — I2510 Atherosclerotic heart disease of native coronary artery without angina pectoris: Secondary | ICD-10-CM | POA: Diagnosis not present

## 2017-02-17 DIAGNOSIS — E079 Disorder of thyroid, unspecified: Secondary | ICD-10-CM | POA: Diagnosis not present

## 2017-02-17 DIAGNOSIS — Z955 Presence of coronary angioplasty implant and graft: Secondary | ICD-10-CM | POA: Diagnosis not present

## 2017-02-17 DIAGNOSIS — I10 Essential (primary) hypertension: Secondary | ICD-10-CM | POA: Diagnosis not present

## 2017-02-17 DIAGNOSIS — I5032 Chronic diastolic (congestive) heart failure: Secondary | ICD-10-CM | POA: Diagnosis not present

## 2017-02-17 DIAGNOSIS — R600 Localized edema: Secondary | ICD-10-CM | POA: Diagnosis not present

## 2017-02-17 DIAGNOSIS — I480 Paroxysmal atrial fibrillation: Secondary | ICD-10-CM | POA: Diagnosis not present

## 2017-02-17 DIAGNOSIS — E669 Obesity, unspecified: Secondary | ICD-10-CM | POA: Diagnosis not present

## 2017-02-17 DIAGNOSIS — J069 Acute upper respiratory infection, unspecified: Secondary | ICD-10-CM | POA: Diagnosis not present

## 2017-02-17 DIAGNOSIS — E784 Other hyperlipidemia: Secondary | ICD-10-CM | POA: Diagnosis not present

## 2017-03-03 ENCOUNTER — Other Ambulatory Visit: Payer: Self-pay | Admitting: Family Medicine

## 2017-03-03 MED ORDER — SIMVASTATIN 40 MG PO TABS: ORAL_TABLET | ORAL | 1 refills | Status: DC

## 2017-03-03 NOTE — Progress Notes (Signed)
90 day supply requested to [facility]

## 2017-03-08 ENCOUNTER — Ambulatory Visit (INDEPENDENT_AMBULATORY_CARE_PROVIDER_SITE_OTHER): Payer: Medicare Other | Admitting: Family Medicine

## 2017-03-08 ENCOUNTER — Encounter: Payer: Self-pay | Admitting: Family Medicine

## 2017-03-08 DIAGNOSIS — Z5181 Encounter for therapeutic drug level monitoring: Secondary | ICD-10-CM

## 2017-03-08 DIAGNOSIS — D649 Anemia, unspecified: Secondary | ICD-10-CM | POA: Diagnosis not present

## 2017-03-08 DIAGNOSIS — E782 Mixed hyperlipidemia: Secondary | ICD-10-CM

## 2017-03-08 DIAGNOSIS — M778 Other enthesopathies, not elsewhere classified: Secondary | ICD-10-CM

## 2017-03-08 DIAGNOSIS — I480 Paroxysmal atrial fibrillation: Secondary | ICD-10-CM

## 2017-03-08 DIAGNOSIS — I2510 Atherosclerotic heart disease of native coronary artery without angina pectoris: Secondary | ICD-10-CM | POA: Diagnosis not present

## 2017-03-08 DIAGNOSIS — M7581 Other shoulder lesions, right shoulder: Secondary | ICD-10-CM | POA: Diagnosis not present

## 2017-03-08 DIAGNOSIS — E039 Hypothyroidism, unspecified: Secondary | ICD-10-CM

## 2017-03-08 LAB — LIPID PANEL
CHOL/HDL RATIO: 2.7 ratio (ref <5.0)
CHOLESTEROL: 163 mg/dL (ref <200)
HDL: 60 mg/dL (ref >40)
LDL Cholesterol: 82 mg/dL (ref <100)
Triglycerides: 105 mg/dL (ref <150)
VLDL: 21 mg/dL (ref <30)

## 2017-03-08 LAB — COMPLETE METABOLIC PANEL WITH GFR
ALK PHOS: 68 U/L (ref 40–115)
ALT: 10 U/L (ref 9–46)
AST: 16 U/L (ref 10–35)
Albumin: 3.9 g/dL (ref 3.6–5.1)
BUN: 14 mg/dL (ref 7–25)
CALCIUM: 9.2 mg/dL (ref 8.6–10.3)
CHLORIDE: 101 mmol/L (ref 98–110)
CO2: 27 mmol/L (ref 20–32)
Creat: 0.83 mg/dL (ref 0.70–1.11)
GFR, Est Non African American: 78 mL/min (ref >=60)
Glucose, Bld: 94 mg/dL (ref 65–99)
Potassium: 5.2 mmol/L (ref 3.5–5.3)
Sodium: 137 mmol/L (ref 135–146)
Total Bilirubin: 0.4 mg/dL (ref 0.2–1.2)
Total Protein: 6.2 g/dL (ref 6.1–8.1)

## 2017-03-08 LAB — CBC WITH DIFFERENTIAL/PLATELET
BASOS ABS: 0 {cells}/uL (ref 0–200)
Basophils Relative: 0 %
Eosinophils Absolute: 162 cells/uL (ref 15–500)
Eosinophils Relative: 3 %
HEMATOCRIT: 38.3 % — AB (ref 38.5–50.0)
Hemoglobin: 12.2 g/dL — ABNORMAL LOW (ref 13.2–17.1)
LYMPHS PCT: 24 %
Lymphs Abs: 1296 cells/uL (ref 850–3900)
MCH: 29.3 pg (ref 27.0–33.0)
MCHC: 31.9 g/dL — ABNORMAL LOW (ref 32.0–36.0)
MCV: 92.1 fL (ref 80.0–100.0)
MONO ABS: 702 {cells}/uL (ref 200–950)
MPV: 9.8 fL (ref 7.5–12.5)
Monocytes Relative: 13 %
Neutro Abs: 3240 cells/uL (ref 1500–7800)
Neutrophils Relative %: 60 %
Platelets: 197 10*3/uL (ref 140–400)
RBC: 4.16 MIL/uL — ABNORMAL LOW (ref 4.20–5.80)
RDW: 14.5 % (ref 11.0–15.0)
WBC: 5.4 10*3/uL (ref 3.8–10.8)

## 2017-03-08 NOTE — Assessment & Plan Note (Signed)
On aspirin; managed by heart doctor

## 2017-03-08 NOTE — Assessment & Plan Note (Signed)
Offered referral to ortho or PT: he'll work on his own he says

## 2017-03-08 NOTE — Assessment & Plan Note (Signed)
Taking aspirin; seeing heart doctor

## 2017-03-08 NOTE — Progress Notes (Signed)
BP 118/72   Pulse 71   Temp 97.7 F (36.5 C) (Oral)   Resp 16   Wt 193 lb 14.4 oz (88 kg)   SpO2 97%   BMI 29.48 kg/m    Subjective:    Patient ID: Kirk Sanchez, male    DOB: [birth year], 83 y.o.   MRN: [id number]  HPI: Kirk Sanchez is a 83 y.o. male  Chief Complaint  Patient presents with  . Follow-up    6 month     HPI Patient is here for six month f/u  He has eye surgery next week, corneal transplant at [facility]; going to do the left; already did the right  He has hypertension; not checking BP at home; eats too much salt  Atrial fibrillation; sees heart doctor; coronary artery disease, stent placed years ago  Hypothyroidism; weight stable  He has a build up of calcium in his right shoulder; he is right-handed; cannot raise the right arm   GERD; uses heartburn medicine sometimes  High cholesterol; came fasting; eats some fatty meats but not often; 2-3 eggs a week Lab Results  Component Value Date   CHOL 165 09/08/2016   CHOL 159 03/03/2016   CHOL 192 06/09/2015   Lab Results  Component Value Date   HDL 69 09/08/2016   HDL 70 03/03/2016   HDL 70 06/09/2015   Lab Results  Component Value Date   LDLCALC 76 09/08/2016   LDLCALC 69 03/03/2016   LDLCALC 96 06/09/2015   Lab Results  Component Value Date   TRIG 99 09/08/2016   TRIG 101 03/03/2016   TRIG 129 06/09/2015   Lab Results  Component Value Date   CHOLHDL 2.4 09/08/2016   CHOLHDL 2.3 03/03/2016   CHOLHDL 2.7 06/09/2015   No results found for: LDLDIRECT   Depression screen [facility] 2/9 03/08/2017 09/08/2016 03/03/2016 06/09/2015  Decreased Interest 0 0 0 0  Down, Depressed, Hopeless 0 0 0 0  PHQ - 2 Score 0 0 0 0    Relevant past medical, surgical, family and social history reviewed Past Medical History:  Diagnosis Date  . Atrial fibrillation ([place])   . Hyperlipidemia   . Hypertension   . Kidney stone   . Presbycusis of both ears 03/06/2016  . Thyroid disease    Past Surgical History:    Procedure Laterality Date  . HAND ARTHROPLASTY    . HIP ARTHROPLASTY    . STENT PLACEMENT RT URETER ([facility] HX)    MD note: corneal transplant  Family History  Problem Relation Age of Onset  . Cancer Mother        stomach  . Asthma Father   . Stroke Neg Hx   . Heart disease Neg Hx    Social History   Social History  . Marital status: Widowed    Spouse name: N/A  . Number of children: N/A  . Years of education: N/A   Occupational History  . Not on file.   Social History Main Topics  . Smoking status: Never Smoker  . Smokeless tobacco: Never Used  . Alcohol use No  . Drug use: No  . Sexual activity: No   Other Topics Concern  . Not on file   Social History Narrative  . No narrative on file    Interim medical history since last visit reviewed. Allergies and medications reviewed  Review of Systems  Cardiovascular: Negative for chest pain.  Gastrointestinal: Negative for blood in stool.   Per HPI  unless specifically indicated above     Objective:    BP 118/72   Pulse 71   Temp 97.7 F (36.5 C) (Oral)   Resp 16   Wt 193 lb 14.4 oz (88 kg)   SpO2 97%   BMI 29.48 kg/m   Wt Readings from Last 3 Encounters:  03/08/17 193 lb 14.4 oz (88 kg)  09/08/16 195 lb 6 oz (88.6 kg)  04/08/16 190 lb (86.2 kg)    Physical Exam  Constitutional: He appears well-developed and well-nourished. No distress.  HENT:  Head: Normocephalic and atraumatic.  Right Ear: Decreased hearing is noted.  Left Ear: Decreased hearing is noted.  Mouth/Throat: Mucous membranes are normal.  Very hard of hearing  Eyes: EOM are normal. No scleral icterus.  Neck: No thyromegaly present.  Cardiovascular: Normal rate.  An irregularly irregular rhythm present.  Pulmonary/Chest: Effort normal and breath sounds normal.  Abdominal: Soft. Bowel sounds are normal. He exhibits no distension.  Musculoskeletal: He exhibits no edema.       Right shoulder: He exhibits decreased range of motion. He  exhibits no tenderness and no bony tenderness.  Neurological: He is alert. He displays no tremor. Coordination normal.  Skin: Skin is warm and dry. No pallor.  Psychiatric: He has a normal mood and affect. His behavior is normal. Judgment and thought content normal. His mood appears not anxious. Cognition and memory are not impaired. He does not exhibit a depressed mood.  Very pleasant and cooperative    Results for orders placed or performed in visit on 09/08/16  TSH  Result Value Ref Range   TSH 3.11 0.40 - 4.50 mIU/L  COMPLETE METABOLIC PANEL WITH GFR  Result Value Ref Range   Sodium 138 135 - 146 mmol/L   Potassium 5.0 3.5 - 5.3 mmol/L   Chloride 100 98 - 110 mmol/L   CO2 30 20 - 31 mmol/L   Glucose, Bld 86 65 - 99 mg/dL   BUN 14 7 - 25 mg/dL   Creat 1.05 0.70 - 1.11 mg/dL   Total Bilirubin 0.6 0.2 - 1.2 mg/dL   Alkaline Phosphatase 59 40 - 115 U/L   AST 20 10 - 35 U/L   ALT 12 9 - 46 U/L   Total Protein 6.5 6.1 - 8.1 g/dL   Albumin 4.1 3.6 - 5.1 g/dL   Calcium 9.2 8.6 - 10.3 mg/dL   GFR, Est African American 72 >=60 mL/min   GFR, Est Non African American 63 >=60 mL/min  CBC with Differential/Platelet  Result Value Ref Range   WBC 5.5 3.8 - 10.8 K/uL   RBC 4.39 4.20 - 5.80 MIL/uL   Hemoglobin 12.5 (L) 13.2 - 17.1 g/dL   HCT 39.8 38.5 - 50.0 %   MCV 90.7 80.0 - 100.0 fL   MCH 28.5 27.0 - 33.0 pg   MCHC 31.4 (L) 32.0 - 36.0 g/dL   RDW 14.6 11.0 - 15.0 %   Platelets 200 140 - 400 K/uL   MPV 10.9 7.5 - 12.5 fL   Neutro Abs 3,190 1,500 - 7,800 cells/uL   Lymphs Abs 1,540 850 - 3,900 cells/uL   Monocytes Absolute 605 200 - 950 cells/uL   Eosinophils Absolute 165 15 - 500 cells/uL   Basophils Absolute 0 0 - 200 cells/uL   Neutrophils Relative % 58 %   Lymphocytes Relative 28 %   Monocytes Relative 11 %   Eosinophils Relative 3 %   Basophils Relative 0 %   Smear  Review Criteria for review not met   Lipid panel  Result Value Ref Range   Cholesterol 165 <200 mg/dL    Triglycerides 99 <150 mg/dL   HDL 69 >40 mg/dL   Total CHOL/HDL Ratio 2.4 <5.0 Ratio   VLDL 20 <30 mg/dL   LDL Cholesterol 76 <100 mg/dL      Assessment & Plan:   Problem List Items Addressed This Visit      Cardiovascular and Mediastinum   Atrial fibrillation ([place])    On aspirin; managed by heart doctor      Arteriosclerosis of coronary artery    Taking aspirin; seeing heart doctor      Relevant Orders   Lipid panel     Endocrine   Hypothyroidism    Last TSH normal; weight stable        Musculoskeletal and Integument   Right shoulder tendonitis    Offered referral to ortho or PT: he'll work on his own he says        Other   Medication monitoring encounter    Check liver and kidney labs      Relevant Orders   COMPLETE METABOLIC PANEL WITH GFR   HLD (hyperlipidemia)    Check fasting lipids today; continue statin; limit fatty meats      Anemia    Likely anemia or chronic disease      Relevant Orders   CBC with Differential/Platelet       Follow up plan: Return in about 6 months (around 09/08/2017).  An after-visit summary was printed and given to the patient at [place].  Please see the patient instructions which may contain other information and recommendations beyond what is mentioned above in the assessment and plan.  No orders of the defined types were placed in this encounter.   Orders Placed This Encounter  Procedures  . CBC with Differential/Platelet  . COMPLETE METABOLIC PANEL WITH GFR  . Lipid panel

## 2017-03-08 NOTE — Assessment & Plan Note (Signed)
Check liver and kidney labs

## 2017-03-08 NOTE — Assessment & Plan Note (Signed)
Check fasting lipids today; continue statin; limit fatty meats

## 2017-03-08 NOTE — Assessment & Plan Note (Signed)
Likely anemia or chronic disease

## 2017-03-08 NOTE — Assessment & Plan Note (Signed)
Last TSH normal; weight stable

## 2017-03-08 NOTE — Patient Instructions (Addendum)
Return for a Medicare Wellness visit with our staff  Shoulder Range of Motion Exercises Shoulder range of motion (ROM) exercises are designed to keep the shoulder moving freely. They are often recommended for people who have shoulder pain. Phase 1 exercises When you are able, do this exercise 5-6 days per week, or as told by your health care provider. Work toward doing 2 sets of 10 swings. Pendulum Exercise How To Do This Exercise Lying Down 1. Lie face-down on a bed with your abdomen close to the side of the bed. 2. Let your arm hang over the side of the bed. 3. Relax your shoulder, arm, and hand. 4. Slowly and gently swing your arm forward and back. Do not use your neck muscles to swing your arm. They should be relaxed. If you are struggling to swing your arm, have someone gently swing it for you. When you do this exercise for the first time, swing your arm at a 15 degree angle for 15 seconds, or swing your arm 10 times. As pain lessens over time, increase the angle of the swing to 30-45 degrees. 5. Repeat steps 1-4 with the other arm.  How To Do This Exercise While Standing 1. Stand next to a sturdy chair or table and hold on to it with your hand. 1. Bend forward at the waist. 2. Bend your knees slightly. 3. Relax your other arm and let it hang limp. 4. Relax the shoulder blade of the arm that is hanging and let it drop. 5. While keeping your shoulder relaxed, use body motion to swing your arm in small circles. The first time you do this exercise, swing your arm for about 30 seconds or 10 times. When you do it next time, swing your arm for a little longer. 6. Stand up tall and relax. 7. Repeat steps 1-7, this time changing the direction of the circles. 2. Repeat steps 1-8 with the other arm.  Phase 2 exercises Do these exercises 3-4 times per day on 5-6 days per week or as told by your health care provider. Work toward holding the stretch for 20 seconds. Stretching Exercise 1 1. Lift  your arm straight out in front of you. 2. Bend your arm 90 degrees at the elbow (right angle) so your forearm goes across your body and looks like the letter "L." 3. Use your other arm to gently pull the elbow forward and across your body. 4. Repeat steps 1-3 with the other arm. Stretching Exercise 2 You will need a towel or rope for this exercise. 1. Bend one arm behind your back with the palm facing outward. 2. Hold a towel with your other hand. 3. Reach the arm that holds the towel above your head, and bend that arm at the elbow. Your wrist should be behind your neck. 4. Use your free hand to grab the free end of the towel. 5. With the higher hand, gently pull the towel up behind you. 6. With the lower hand, pull the towel down behind you. 7. Repeat steps 1-6 with the other arm.  Phase 3 exercises Do each of these exercises at four different times of day (sessions) every day or as told by your health care provider. To begin with, repeat each exercise 5 times (repetitions). Work toward doing 3 sets of 12 repetitions or as told by your health care provider. Strengthening Exercise 1 You will need a light weight for this activity. As you grow stronger, you may use a heavier weight.  1. Standing with a weight in your hand, lift your arm straight out to the side until it is at the same height as your shoulder. 2. Bend your arm at 90 degrees so that your fingers are pointing to the ceiling. 3. Slowly raise your hand until your arm is straight up in the air. 4. Repeat steps 1-3 with the other arm.  Strengthening Exercise 2 You will need a light weight for this activity. As you grow stronger, you may use a heavier weight. 1. Standing with a weight in your hand, gradually move your straight arm in an arc, starting at your side, then out in front of you, then straight up over your head. 2. Gradually move your other arm in an arc, starting at your side, then out in front of you, then straight up over  your head. 3. Repeat steps 1-2 with the other arm.  Strengthening Exercise 3 You will need an elastic band for this activity. As you grow stronger, gradually increase the size of the bands or increase the number of bands that you use at one time. 1. While standing, hold an elastic band in one hand and raise that arm up in the air. 2. With your other hand, pull down the band until that hand is by your side. 3. Repeat steps 1-2 with the other arm.  This information is not intended to replace advice given to you by your health care provider. Make sure you discuss any questions you have with your health care provider. Document Released: 04/17/2003 Document Revised: 03/14/2016 Document Reviewed: 07/15/2014 Elsevier Interactive Patient Education  [employer].

## 2017-03-14 DIAGNOSIS — H1851 Endothelial corneal dystrophy: Secondary | ICD-10-CM | POA: Diagnosis not present

## 2017-03-14 DIAGNOSIS — H3530 Unspecified macular degeneration: Secondary | ICD-10-CM | POA: Diagnosis not present

## 2017-03-14 DIAGNOSIS — Z961 Presence of intraocular lens: Secondary | ICD-10-CM | POA: Diagnosis not present

## 2017-03-14 DIAGNOSIS — H26492 Other secondary cataract, left eye: Secondary | ICD-10-CM | POA: Diagnosis not present

## 2017-03-22 DIAGNOSIS — H1851 Endothelial corneal dystrophy: Secondary | ICD-10-CM | POA: Diagnosis not present

## 2017-03-22 DIAGNOSIS — Z8679 Personal history of other diseases of the circulatory system: Secondary | ICD-10-CM | POA: Diagnosis not present

## 2017-03-22 DIAGNOSIS — I10 Essential (primary) hypertension: Secondary | ICD-10-CM | POA: Diagnosis not present

## 2017-03-22 DIAGNOSIS — Z9861 Coronary angioplasty status: Secondary | ICD-10-CM | POA: Diagnosis not present

## 2017-03-22 DIAGNOSIS — I2510 Atherosclerotic heart disease of native coronary artery without angina pectoris: Secondary | ICD-10-CM | POA: Diagnosis not present

## 2017-03-22 DIAGNOSIS — Z96643 Presence of artificial hip joint, bilateral: Secondary | ICD-10-CM | POA: Diagnosis not present

## 2017-03-22 DIAGNOSIS — E039 Hypothyroidism, unspecified: Secondary | ICD-10-CM | POA: Diagnosis not present

## 2017-05-15 ENCOUNTER — Emergency Department: Payer: Medicare Other

## 2017-05-15 ENCOUNTER — Emergency Department
Admission: EM | Admit: 2017-05-15 | Discharge: 2017-05-15 | Disposition: A | Discharge status: Home / Self Care | Payer: Medicare Other | Attending: Emergency Medicine | Admitting: Emergency Medicine

## 2017-05-15 ENCOUNTER — Encounter: Payer: Self-pay | Admitting: Emergency Medicine

## 2017-05-15 DIAGNOSIS — E039 Hypothyroidism, unspecified: Secondary | ICD-10-CM | POA: Insufficient documentation

## 2017-05-15 DIAGNOSIS — R51 Headache: Secondary | ICD-10-CM | POA: Insufficient documentation

## 2017-05-15 DIAGNOSIS — W19XXXA Unspecified fall, initial encounter: Secondary | ICD-10-CM

## 2017-05-15 DIAGNOSIS — W1789XA Other fall from one level to another, initial encounter: Secondary | ICD-10-CM | POA: Insufficient documentation

## 2017-05-15 DIAGNOSIS — Z960 Presence of urogenital implants: Secondary | ICD-10-CM | POA: Diagnosis not present

## 2017-05-15 DIAGNOSIS — Z79899 Other long term (current) drug therapy: Secondary | ICD-10-CM | POA: Diagnosis not present

## 2017-05-15 DIAGNOSIS — I2510 Atherosclerotic heart disease of native coronary artery without angina pectoris: Secondary | ICD-10-CM | POA: Insufficient documentation

## 2017-05-15 DIAGNOSIS — Z7984 Long term (current) use of oral hypoglycemic drugs: Secondary | ICD-10-CM | POA: Diagnosis not present

## 2017-05-15 DIAGNOSIS — S0990XA Unspecified injury of head, initial encounter: Secondary | ICD-10-CM | POA: Diagnosis not present

## 2017-05-15 DIAGNOSIS — Z96649 Presence of unspecified artificial hip joint: Secondary | ICD-10-CM | POA: Insufficient documentation

## 2017-05-15 DIAGNOSIS — M542 Cervicalgia: Secondary | ICD-10-CM | POA: Diagnosis not present

## 2017-05-15 DIAGNOSIS — I10 Essential (primary) hypertension: Secondary | ICD-10-CM | POA: Insufficient documentation

## 2017-05-15 DIAGNOSIS — Z0000 Encounter for general adult medical examination without abnormal findings: Secondary | ICD-10-CM | POA: Diagnosis not present

## 2017-05-15 DIAGNOSIS — Z043 Encounter for examination and observation following other accident: Secondary | ICD-10-CM | POA: Diagnosis not present

## 2017-05-15 DIAGNOSIS — S199XXA Unspecified injury of neck, initial encounter: Secondary | ICD-10-CM | POA: Diagnosis not present

## 2017-05-15 NOTE — ED Triage Notes (Signed)
FIRST NURSE NOTE-fell hit head, no LOC. On coumadin.

## 2017-05-15 NOTE — ED Triage Notes (Signed)
States fell off step stool and hit back of head 5 days ago. Did not see pcp. States feels worse today. Took 2 asa and 2 tylenol around 1130a

## 2017-05-15 NOTE — ED Provider Notes (Signed)
[facility] Emergency Department Provider Note   ____________________________________________   First MD Initiated Contact with Patient 05/15/17 1740     (approximate)  I have reviewed the triage vital signs and the nursing notes.   HISTORY  Chief Complaint Fall (x 5 days ago)    HPI Kirk Sanchez is a 83 y.o. male Who reports he stepped up on a brick step to get into his freezer hit his head on the insulation was hanging down from the roof and when he stepped back down he lost his balance fell and hit his head. He does not remember passing out. He did not have a headache when the injury happened but the next few days after that developed increasing headache and pain in his neck. After the CT scan his headache and neck pain disappeared.   Past Medical History:  Diagnosis Date  . Atrial fibrillation ([facility])   . Hyperlipidemia   . Hypertension   . Kidney stone   . Presbycusis of both ears 03/06/2016  . Thyroid disease     Patient Active Problem List   Diagnosis Date Noted  . Right shoulder tendonitis 03/08/2017  . Atrial fibrillation ([facility]) 03/06/2016  . Presbycusis of both ears 03/06/2016  . Medication monitoring encounter 03/03/2016  . Anemia 03/03/2016  . Arteriosclerosis of coronary artery 01/28/2015  . Cardiac murmur 01/28/2015  . HLD (hyperlipidemia) 01/28/2015  . BP (high blood pressure) 01/28/2015  . Hypothyroidism 01/28/2015    Past Surgical History:  Procedure Laterality Date  . HAND ARTHROPLASTY    . HIP ARTHROPLASTY    . STENT PLACEMENT RT URETER ([facility] HX)      Prior to Admission medications   Medication Sig Start Date End Date Taking? Authorizing Provider  aspirin 81 MG tablet Take 81 mg by mouth daily.   Yes [provider]  Calcium Carb-Cholecalciferol (CALCIUM-VITAMIN D) 500-200 MG-UNIT tablet Take 1 tablet by mouth 2 (two) times daily.    Yes [provider]  levothyroxine (SYNTHROID, LEVOTHROID) 25 MCG  tablet TAKE 1 TABLET BY MOUTH ONCE DAILY ON AN EMPTY STOMACH. WAIT 30 MINUTES BEFORE TAKING OTHER MEDS. 09/08/16  Yes Lada, Janit Bern, MD  metoprolol succinate (TOPROL-XL) 50 MG 24 hr tablet Take 50 mg by mouth daily. 05/09/17  Yes [provider]  ranitidine (ZANTAC) 150 MG tablet Take 1 tablet (150 mg total) by mouth 2 (two) times daily. If needed for heartburn 09/08/16  Yes Lada, Janit Bern, MD  simvastatin (ZOCOR) 40 MG tablet One by mouth every evening for cholesterol 03/03/17  Yes Lada, Janit Bern, MD    Allergies Patient has no known allergies.  Family History  Problem Relation Age of Onset  . Cancer Mother        stomach  . Asthma Father   . Stroke Neg Hx   . Heart disease Neg Hx     Social History Social History  Substance Use Topics  . Smoking status: Never Smoker  . Smokeless tobacco: Never Used  . Alcohol use No    Review of Systems  Constitutional: No fever/chills Eyes: No visual changes. ENT: No sore throat. Cardiovascular: Denies chest pain. Respiratory: Denies shortness of breath. Gastrointestinal: No abdominal pain.  No nausea, no vomiting.  No diarrhea.  No constipation. Genitourinary: Negative for dysuria. Musculoskeletal: Negative for back pain. Skin: Negative for rash. Neurological: Negative for headaches, focal weakness   ____________________________________________   PHYSICAL EXAM:  VITAL SIGNS: ED Triage Vitals  Enc Vitals Group  BP 05/15/17 1556 (!) 153/99     Pulse Rate 05/15/17 1556 (!) 119     Resp 05/15/17 1556 18     Temp 05/15/17 1556 97.7 F (36.5 C)     Temp src --      SpO2 05/15/17 1556 94 %     Weight 05/15/17 1557 185 lb (83.9 kg)     Height 05/15/17 1557  (1.727 m)     Head Circumference --      Peak Flow --      Pain Score 05/15/17 1556 8     Pain Loc --      Pain Edu? --      Excl. in GC? --    Constitutional: Alert and oriented. Well appearing and in no acute distress. Eyes: Conjunctivae are normal.  PERRL. EOMI. Head: Atraumatic. Nose: No congestion/rhinnorhea. Mouth/Throat: Mucous membranes are moist.  Oropharynx non-erythematous. Neck: No stridor.   Cardiovascular: Normal rate, regular rhythm. Grossly normal heart sounds.  Good peripheral circulation. Respiratory: Normal respiratory effort.  No retractions. Lungs CTAB. Gastrointestinal: Soft and nontender. No distention. No abdominal bruits. No CVA tenderness. Musculoskeletal: No lower extremity tenderness nor edema.  No joint effusions. Neurologic:  Normal speech and language. No gross focal neurologic deficits are appreciated. No gait instability. Skin:  Skin is warm, dry and intact. No rash noted. Psychiatric: Mood and affect are normal. Speech and behavior are normal.  ____________________________________________   LABS (all labs ordered are listed, but only abnormal results are displayed)  Labs Reviewed - No data to display ____________________________________________  EKG   ____________________________________________  RADIOLOGY  Ct Head Wo Contrast  Result Date: 05/15/2017 CLINICAL DATA:  Status post fall from a stool 5 days ago with a blow to the back of the head. Worsening headache. Initial encounter. EXAM: CT HEAD WITHOUT CONTRAST TECHNIQUE: Contiguous axial images were obtained from the base of the skull through the vertex without intravenous contrast. COMPARISON:  Head CT scan 09/01/2012. FINDINGS: Brain: There is cortical atrophy and extensive chronic microvascular ischemic change. No evidence of acute abnormality including infarct, hemorrhage, mass lesion, mass effect, midline shift or abnormal extra-axial fluid collection. No hydrocephalus or pneumocephalus. Vascular: Atherosclerosis noted. Skull: Intact Sinuses/Orbits: No acute abnormality.  Status post lens extraction. Other: None. IMPRESSION: No acute abnormality. Atrophy and extensive chronic microvascular ischemic change. Atherosclerosis. Electronically  Signed   By: Drusilla Kanner M.D.   On: 05/15/2017 16:24   Ct Cervical Spine Wo Contrast  Result Date: 05/15/2017 CLINICAL DATA:  Neck pain since a fall today.  Initial encounter. EXAM: CT CERVICAL SPINE WITHOUT CONTRAST TECHNIQUE: Multidetector CT imaging of the cervical spine was performed without intravenous contrast. Multiplanar CT image reconstructions were also generated. COMPARISON:  None. FINDINGS: Alignment: Mild facet mediated anterolisthesis C7 on T1 is noted. Otherwise maintained. Skull base and vertebrae: No acute fracture. No primary bone lesion or focal pathologic process. Soft tissues and spinal canal: No prevertebral fluid or swelling. No visible canal hematoma. Disc levels: Multilevel loss of disc space height is identified. No marked central canal stenosis is seen. Scattered facet arthropathy noted. Upper chest: Lung apices clear. Other: None. IMPRESSION: No acute abnormality. Cervical spondylosis. Electronically Signed   By: Drusilla Kanner M.D.   On: 05/15/2017 17:16   CT of the head and neck are essentially showing no acute disease ____________________________________________   PROCEDURES  Procedure(s) performed:   Procedures  Critical Care performed:  ____________________________________________   INITIAL IMPRESSION / ASSESSMENT AND PLAN / ED COURSE  patient's pain is gone CTs are negative I will let him go home.     ____________________________________________   FINAL CLINICAL IMPRESSION(S) / ED DIAGNOSES  Final diagnoses:  Fall, initial encounter      NEW MEDICATIONS STARTED DURING THIS VISIT:  New Prescriptions   No medications on file     Note:  This document was prepared using Dragon voice recognition software and may include unintentional dictation errors.    Arnaldo Natal, MD 05/15/17 1900

## 2017-05-15 NOTE — ED Notes (Signed)
Patient transported to CT 

## 2017-05-15 NOTE — Discharge Instructions (Signed)
please be careful not to fall again. Please return for any further problems worse nothing headache and neckache etc.

## 2017-05-15 NOTE — ED Notes (Signed)
Pt is on a blood thinner. 

## 2017-06-22 ENCOUNTER — Other Ambulatory Visit: Payer: Self-pay | Admitting: Family Medicine

## 2017-06-22 DIAGNOSIS — H26492 Other secondary cataract, left eye: Secondary | ICD-10-CM | POA: Diagnosis not present

## 2017-06-24 NOTE — Telephone Encounter (Signed)
Lab Results  Component Value Date   TSH 3.11 09/08/2016

## 2017-08-30 IMAGING — CT CT CHEST W/O CM
2 of 3 series · 15 of 36 positions shown, 18 images · non-contrast
Comparison: None.

CLINICAL DATA: Piece of pork swallowed 2 days ago feels stuck in
chest. Dysphagia. Persistent vomiting.

EXAM:
CT CHEST WITHOUT CONTRAST
TECHNIQUE: Multidetector CT imaging of the chest was performed following the
standard protocol without IV contrast.

[Series 2: thorax · axial · 0.71mm/px · z∈[-666,-420]mm · 12 of 145 slices shown, 15 images]
[im 11/145  mediastinal]
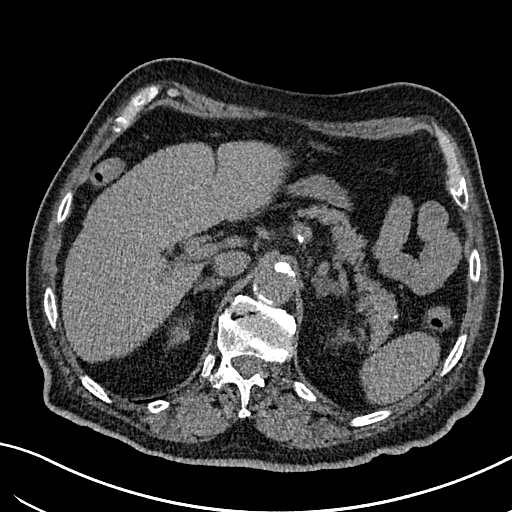
[im 11/145  lung]
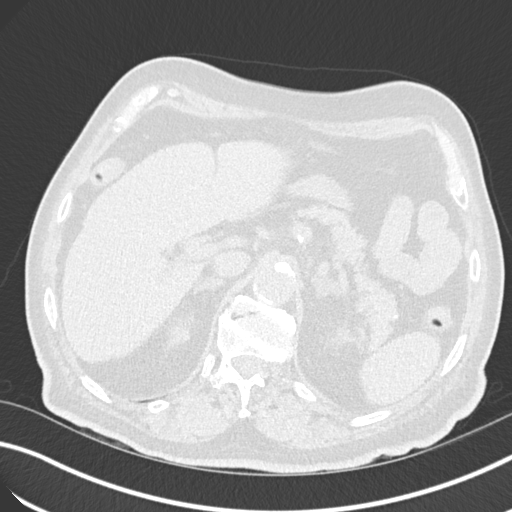
[im 22/145  lung]
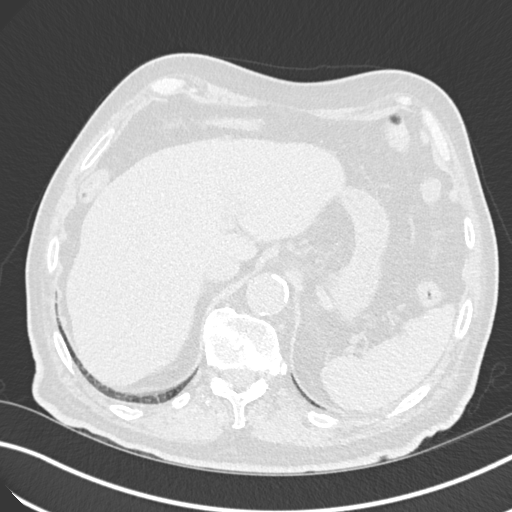
[im 33/145  lung]
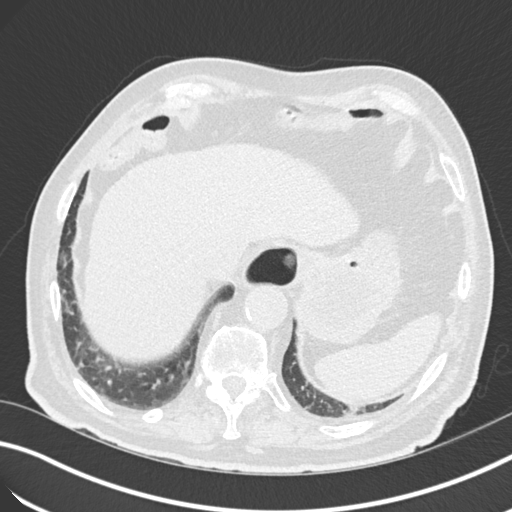
[im 43/145  lung]
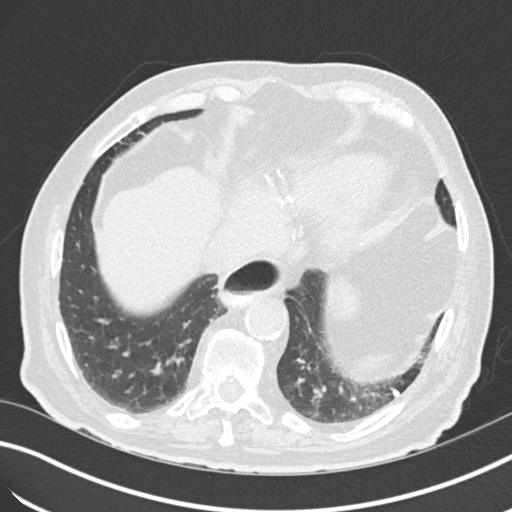
[im 54/145  mediastinal]
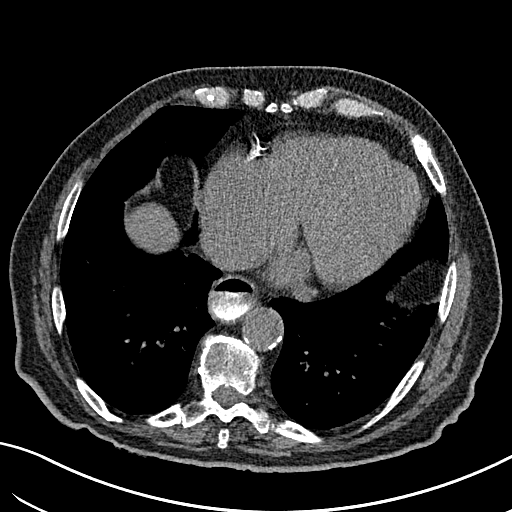
[im 54/145  lung]
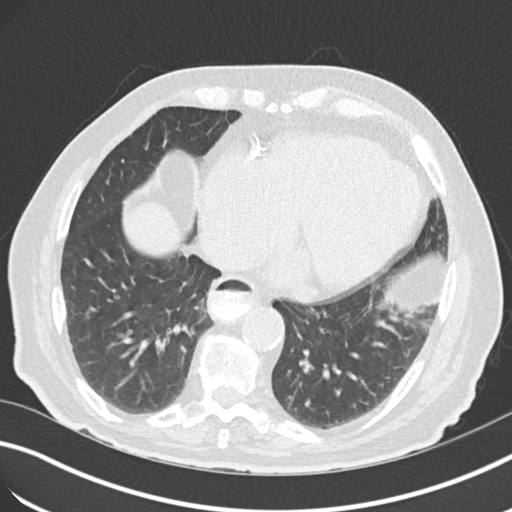
[im 65/145  lung]
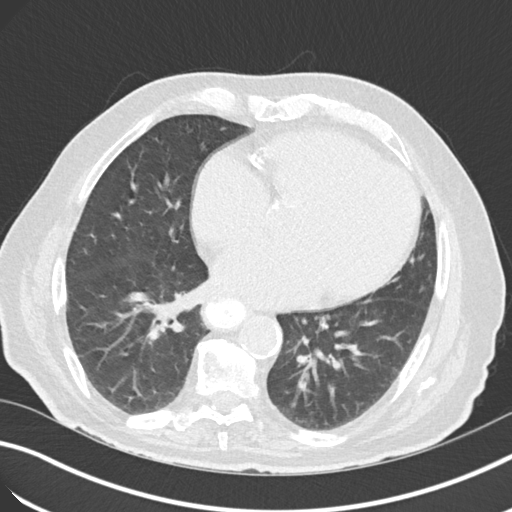
[im 81/145  lung]
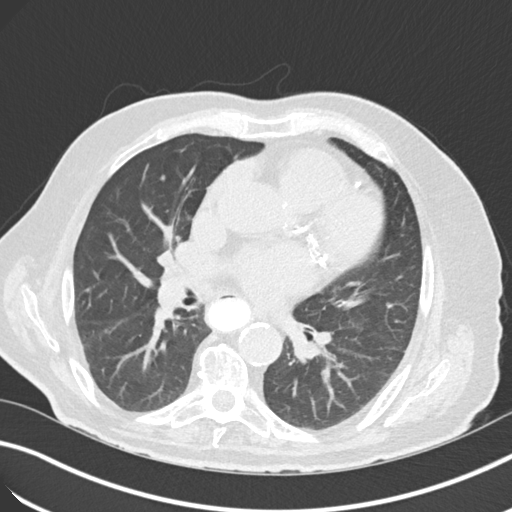
[im 91/145  lung]
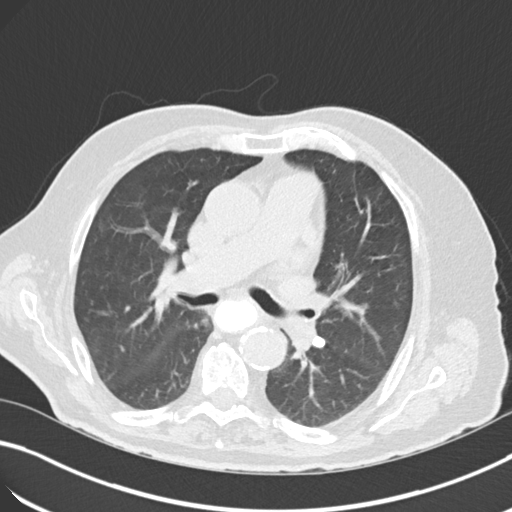
[im 102/145  mediastinal]
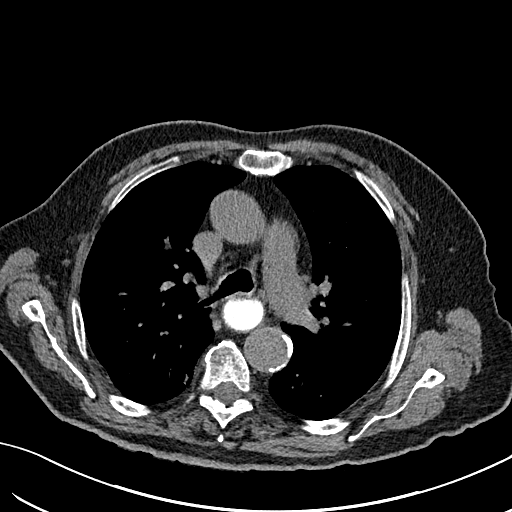
[im 102/145  lung]
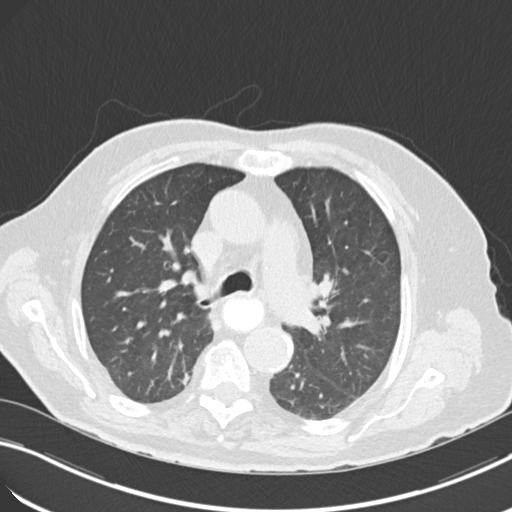
[im 113/145  lung]
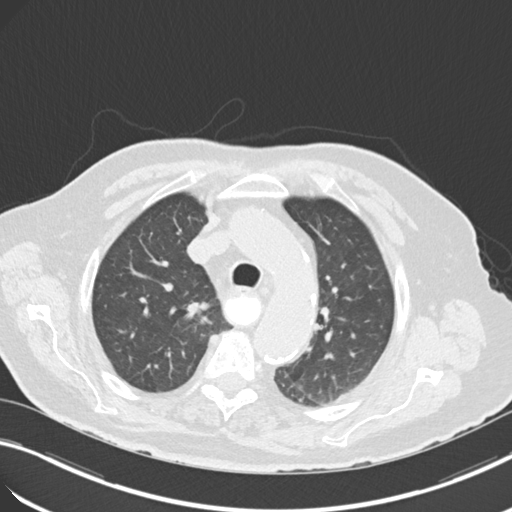
[im 123/145  lung]
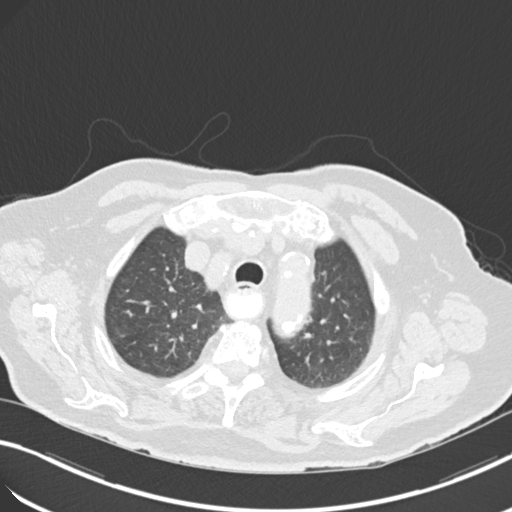
[im 134/145  lung]
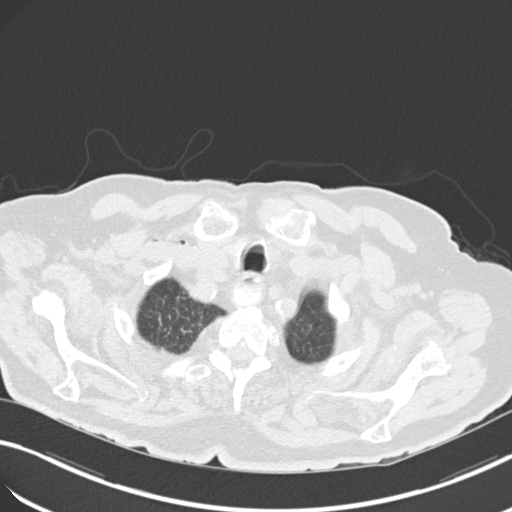

[Series 5: coronal · coronal · 0.59mm/px · 3 of 151 slices shown]
[im 31/151  lung]
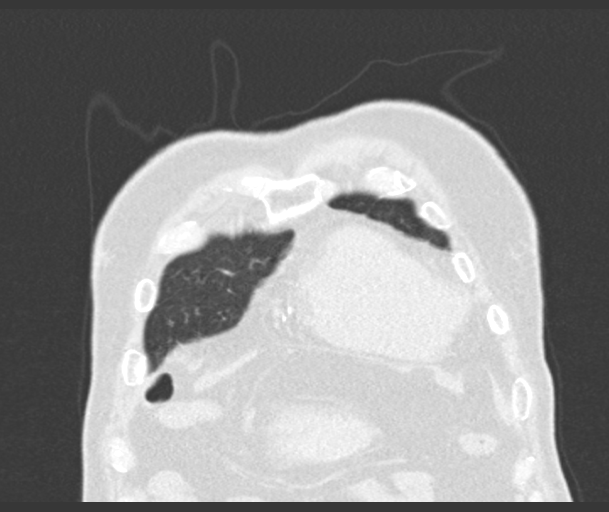
[im 61/151  lung]
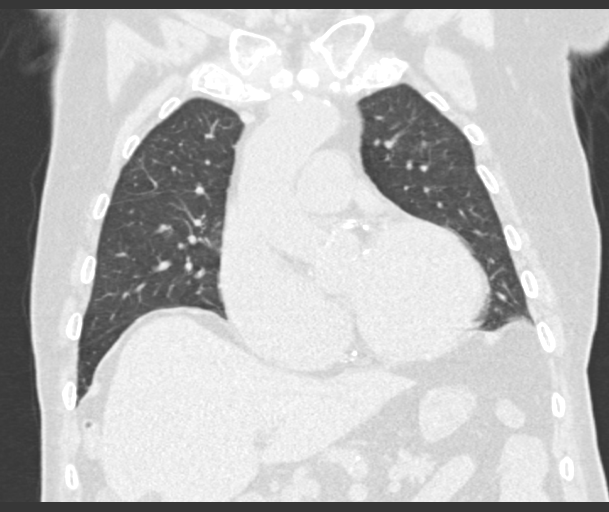
[im 91/151  lung]
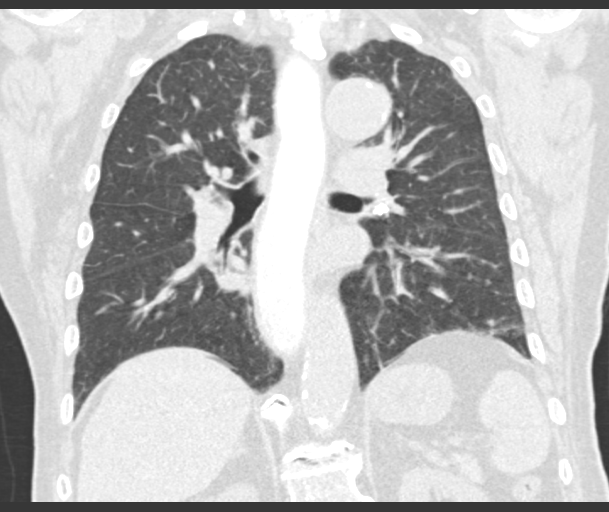

[15 of 36 positions shown; findings below may reference images not displayed]

FINDINGS: Cardiovascular: Mild cardiomegaly. Aortic atherosclerosis.
Three-vessel coronary artery calcification also noted.

Mediastinum/Nodes: No pathologically enlarged lymph nodes identified
on this unenhanced exam. Oral contrast is seen filling a mildly
dilated esophagus. A small approximately 2 cm rounded filling defect
measuring fat attenuation is seen at the gastroesophageal junction,
which is suspicious for an impacted piece of food, with mass
considered less likely given patient history.

Lungs/Pleura: No pulmonary mass, infiltrate, or effusion. Mild left
lower lobe scarring noted.

Upper Abdomen: No acute findings.

Musculoskeletal: No chest wall mass or suspicious bone lesions
identified.
IMPRESSION: Mildly dilated contrast filled esophagus, with small rounded filling
defect measuring fat attenuation at the gastroesophageal junction.
This is suspicious for impacted piece of food, with mass considered
less likely given patient's history. Upper endoscopy recommended for
further evaluation.

Mild cardiomegaly and aortic and coronary artery atherosclerosis.

## 2017-09-08 ENCOUNTER — Other Ambulatory Visit: Payer: Self-pay

## 2017-09-08 ENCOUNTER — Ambulatory Visit (INDEPENDENT_AMBULATORY_CARE_PROVIDER_SITE_OTHER): Payer: Medicare Other | Admitting: Family Medicine

## 2017-09-08 ENCOUNTER — Encounter: Payer: Self-pay | Admitting: Family Medicine

## 2017-09-08 VITALS — BP 122/74 | HR 90 | Temp 98.2°F | Wt 191.8 lb

## 2017-09-08 DIAGNOSIS — Z23 Encounter for immunization: Secondary | ICD-10-CM | POA: Diagnosis not present

## 2017-09-08 DIAGNOSIS — H9113 Presbycusis, bilateral: Secondary | ICD-10-CM | POA: Diagnosis not present

## 2017-09-08 DIAGNOSIS — E782 Mixed hyperlipidemia: Secondary | ICD-10-CM | POA: Diagnosis not present

## 2017-09-08 DIAGNOSIS — Z5181 Encounter for therapeutic drug level monitoring: Secondary | ICD-10-CM | POA: Diagnosis not present

## 2017-09-08 DIAGNOSIS — I2510 Atherosclerotic heart disease of native coronary artery without angina pectoris: Secondary | ICD-10-CM | POA: Diagnosis not present

## 2017-09-08 DIAGNOSIS — I480 Paroxysmal atrial fibrillation: Secondary | ICD-10-CM | POA: Diagnosis not present

## 2017-09-08 DIAGNOSIS — D649 Anemia, unspecified: Secondary | ICD-10-CM

## 2017-09-08 DIAGNOSIS — I10 Essential (primary) hypertension: Secondary | ICD-10-CM | POA: Diagnosis not present

## 2017-09-08 DIAGNOSIS — E039 Hypothyroidism, unspecified: Secondary | ICD-10-CM

## 2017-09-08 LAB — CBC WITH DIFFERENTIAL/PLATELET
BASOS PCT: 0.8 %
Basophils Absolute: 38 cells/uL (ref 0–200)
EOS ABS: 38 {cells}/uL (ref 15–500)
Eosinophils Relative: 0.8 %
HEMATOCRIT: 34.9 % — AB (ref 38.5–50.0)
Hemoglobin: 11.5 g/dL — ABNORMAL LOW (ref 13.2–17.1)
LYMPHS ABS: 590 {cells}/uL — AB (ref 850–3900)
MCH: 28.8 pg (ref 27.0–33.0)
MCHC: 33 g/dL (ref 32.0–36.0)
MCV: 87.3 fL (ref 80.0–100.0)
MPV: 11 fL (ref 7.5–12.5)
Monocytes Relative: 15.9 %
NEUTROS PCT: 70.2 %
Neutro Abs: 3370 cells/uL (ref 1500–7800)
PLATELETS: 152 10*3/uL (ref 140–400)
RBC: 4 10*6/uL — AB (ref 4.20–5.80)
RDW: 13.7 % (ref 11.0–15.0)
Total Lymphocyte: 12.3 %
WBC: 4.8 10*3/uL (ref 3.8–10.8)
WBCMIX: 763 {cells}/uL (ref 200–950)

## 2017-09-08 LAB — COMPLETE METABOLIC PANEL WITH GFR
AG RATIO: 1.5 (calc) (ref 1.0–2.5)
ALT: 12 U/L (ref 9–46)
AST: 20 U/L (ref 10–35)
Albumin: 3.8 g/dL (ref 3.6–5.1)
Alkaline phosphatase (APISO): 70 U/L (ref 40–115)
BILIRUBIN TOTAL: 0.7 mg/dL (ref 0.2–1.2)
BUN: 15 mg/dL (ref 7–25)
CHLORIDE: 96 mmol/L — AB (ref 98–110)
CO2: 26 mmol/L (ref 20–32)
Calcium: 9 mg/dL (ref 8.6–10.3)
Creat: 0.73 mg/dL (ref 0.70–1.11)
GFR, Est African American: 95 mL/min/{1.73_m2} (ref >=60)
GFR, Est Non African American: 82 mL/min/{1.73_m2} (ref >=60)
GLOBULIN: 2.5 g/dL (ref 1.9–3.7)
Glucose, Bld: 89 mg/dL (ref 65–99)
POTASSIUM: 4.6 mmol/L (ref 3.5–5.3)
SODIUM: 133 mmol/L — AB (ref 135–146)
TOTAL PROTEIN: 6.3 g/dL (ref 6.1–8.1)

## 2017-09-08 LAB — TSH: TSH: 3.35 m[IU]/L (ref 0.40–4.50)

## 2017-09-08 LAB — LIPID PANEL
Cholesterol: 129 mg/dL (ref <200)
HDL: 66 mg/dL (ref >40)
LDL Cholesterol (Calc): 48 mg/dL (calc)
Non-HDL Cholesterol (Calc): 63 mg/dL (calc) (ref <130)
Total CHOL/HDL Ratio: 2 (calc) (ref <5.0)
Triglycerides: 74 mg/dL (ref <150)

## 2017-09-08 NOTE — Progress Notes (Signed)
BP 122/74 (BP Location: Right Arm, Patient Position: Sitting, Cuff Size: Large)   Pulse 90   Temp 98.2 F (36.8 C) (Oral)   Wt 191 lb 12.8 oz (87 kg)   SpO2 97%   BMI 29.16 kg/m    Subjective:    Patient ID: Kirk Sanchez, male    DOB: 1925/07/15, 84 y.o.   MRN: [id number]  HPI: Kirk Sanchez is a 84 y.o. male  Chief Complaint  Patient presents with  . Follow-up    HPI Patient is here for follow-up He turned 84 years old last Fall No medical excitement HTN; controlled today Came fasting for labs Atrial fibrillation; feels it, always irregular; he says the heart doctor has checked him out; see DR. Callwood, seeing him twice a year  Hard of hearing; wears a hearing aide; works for him Hypothyroidism; energy level is... "Oh, Lord, compared to what"; about the same, "I get along fine"; his sons help with chores; moving bowels okay Lab Results  Component Value Date   TSH 3.11 09/08/2016  High cholesterol; last total chol 163, and he says it has been up to 300; eating better Anemia, likely chronic disease; no blood in stool He's always been "heavy"; joined [employer] in [employer] and they wouldn't let him go because he weighed too much; he got down to 175 pounds   Depression screen [facility] 2/9 09/08/2017 03/08/2017 09/08/2016 03/03/2016 06/09/2015  Decreased Interest 0 0 0 0 0  Down, Depressed, Hopeless 0 0 0 0 0  PHQ - 2 Score 0 0 0 0 0    Relevant past medical, surgical, family and social history reviewed Past Medical History:  Diagnosis Date  . Atrial fibrillation ([place])   . Hyperlipidemia   . Hypertension   . Kidney stone   . Presbycusis of both ears 03/06/2016  . Thyroid disease    Past Surgical History:  Procedure Laterality Date  . HAND ARTHROPLASTY    . HIP ARTHROPLASTY    . STENT PLACEMENT RT URETER ([facility] HX)     Family History  Problem Relation Age of Onset  . Cancer Mother        stomach  . Asthma Father   . Stroke Neg Hx   . Heart disease Neg Hx     Social History   Tobacco Use  . Smoking status: Never Smoker  . Smokeless tobacco: Never Used  Substance Use Topics  . Alcohol use: No    Alcohol/week: 0.0 oz  . Drug use: No    Interim medical history since last visit reviewed. Allergies and medications reviewed  Review of Systems  Constitutional: Negative for unexpected weight change.  Respiratory: Negative for shortness of breath.   Cardiovascular: Negative for chest pain and leg swelling.   Per HPI unless specifically indicated above     Objective:    BP 122/74 (BP Location: Right Arm, Patient Position: Sitting, Cuff Size: Large)   Pulse 90   Temp 98.2 F (36.8 C) (Oral)   Wt 191 lb 12.8 oz (87 kg)   SpO2 97%   BMI 29.16 kg/m   Wt Readings from Last 3 Encounters:  09/08/17 191 lb 12.8 oz (87 kg)  05/15/17 185 lb (83.9 kg)  03/08/17 193 lb 14.4 oz (88 kg)    Physical Exam  Constitutional: He appears well-developed and well-nourished. No distress.  HENT:  Head: Normocephalic and atraumatic.  Right Ear: Decreased hearing is noted.  Left Ear: Decreased hearing is noted.  Mouth/Throat: Mucous membranes are normal.  Very hard of hearing  Eyes: EOM are normal. No scleral icterus.  Neck: No thyromegaly present.  Cardiovascular: Normal rate. An irregularly irregular rhythm present.  Pulmonary/Chest: Effort normal and breath sounds normal.  Abdominal: Soft. Bowel sounds are normal. He exhibits no distension.  Musculoskeletal: He exhibits no edema.       Right shoulder: He exhibits decreased range of motion. He exhibits no tenderness and no bony tenderness.  Neurological: He is alert. He displays no tremor. Coordination normal.  Skin: Skin is warm and dry. No pallor.  Psychiatric: He has a normal mood and affect. His behavior is normal. Judgment and thought content normal. His mood appears not anxious. Cognition and memory are not impaired. He does not exhibit a depressed mood.  Very pleasant and cooperative    Results for orders placed or performed in visit on 03/08/17  CBC with Differential/Platelet  Result Value Ref Range   WBC 5.4 3.8 - 10.8 K/uL   RBC 4.16 (L) 4.20 - 5.80 MIL/uL   Hemoglobin 12.2 (L) 13.2 - 17.1 g/dL   HCT 38.3 (L) 38.5 - 50.0 %   MCV 92.1 80.0 - 100.0 fL   MCH 29.3 27.0 - 33.0 pg   MCHC 31.9 (L) 32.0 - 36.0 g/dL   RDW 14.5 11.0 - 15.0 %   Platelets 197 140 - 400 K/uL   MPV 9.8 7.5 - 12.5 fL   Neutro Abs 3,240 1,500 - 7,800 cells/uL   Lymphs Abs 1,296 850 - 3,900 cells/uL   Monocytes Absolute 702 200 - 950 cells/uL   Eosinophils Absolute 162 15 - 500 cells/uL   Basophils Absolute 0 0 - 200 cells/uL   Neutrophils Relative % 60 %   Lymphocytes Relative 24 %   Monocytes Relative 13 %   Eosinophils Relative 3 %   Basophils Relative 0 %   Smear Review Criteria for review not met   COMPLETE METABOLIC PANEL WITH GFR  Result Value Ref Range   Sodium 137 135 - 146 mmol/L   Potassium 5.2 3.5 - 5.3 mmol/L   Chloride 101 98 - 110 mmol/L   CO2 27 20 - 32 mmol/L   Glucose, Bld 94 65 - 99 mg/dL   BUN 14 7 - 25 mg/dL   Creat 0.83 0.70 - 1.11 mg/dL   Total Bilirubin 0.4 0.2 - 1.2 mg/dL   Alkaline Phosphatase 68 40 - 115 U/L   AST 16 10 - 35 U/L   ALT 10 9 - 46 U/L   Total Protein 6.2 6.1 - 8.1 g/dL   Albumin 3.9 3.6 - 5.1 g/dL   Calcium 9.2 8.6 - 10.3 mg/dL   GFR, Est African American >89 >=60 mL/min   GFR, Est Non African American 78 >=60 mL/min  Lipid panel  Result Value Ref Range   Cholesterol 163 <200 mg/dL   Triglycerides 105 <150 mg/dL   HDL 60 >40 mg/dL   Total CHOL/HDL Ratio 2.7 <5.0 Ratio   VLDL 21 <30 mg/dL   LDL Cholesterol 82 <100 mg/dL      Assessment & Plan:   Problem List Items Addressed This Visit      Cardiovascular and Mediastinum   BP (high blood pressure)    Controlled today      Atrial fibrillation (HCC)    On blood thinner; sees cardiologist; no RVR      Arteriosclerosis of coronary artery    Sees cardiologist; no chest pain  Endocrine   Hypothyroidism - Primary    Check thyroid test, adjust medicine if needed      Relevant Orders   TSH     Nervous and Auditory   Presbycusis of both ears    Wearing aide        Other   Medication monitoring encounter    Check liver and kidneys, CBC on blood thinner      Relevant Orders   CBC with Differential/Platelet   COMPLETE METABOLIC PANEL WITH GFR   HLD (hyperlipidemia)    Limit fatty meats      Relevant Orders   Lipid panel   Anemia    Back to 2015; likely anemia or chronic disease; recheck      Relevant Orders   CBC with Differential/Platelet    Other Visit Diagnoses    Flu vaccine need       Relevant Orders   Flu vaccine HIGH DOSE PF (Fluzone High dose)       Follow up plan: Return in about 6 months (around 03/08/2018) for follow-up visit with Dr. Sanda Klein; Medicare wellness when due .  An after-visit summary was printed and given to the patient at [facility].  Please see the patient instructions which may contain other information and recommendations beyond what is mentioned above in the assessment and plan.  No orders of the defined types were placed in this encounter.   Orders Placed This Encounter  Procedures  . Flu vaccine HIGH DOSE PF (Fluzone High dose)  . CBC with Differential/Platelet  . COMPLETE METABOLIC PANEL WITH GFR  . Lipid panel  . TSH

## 2017-09-08 NOTE — Assessment & Plan Note (Signed)
Back to 2015; likely anemia or chronic disease; recheck

## 2017-09-08 NOTE — Assessment & Plan Note (Signed)
Check liver and kidneys, CBC on blood thinner

## 2017-09-08 NOTE — Patient Instructions (Addendum)
Try to limit saturated fats in your diet (bologna, hot dogs, barbeque, cheeseburgers, hamburgers, steak, bacon, sausage, cheese, etc.) and get more fresh fruits, vegetables, and whole grains Try to follow the DASH guidelines (DASH stands for Dietary Approaches to Stop Hypertension). Try to limit the sodium in your diet to no more than 1,500mg  of sodium per day. Certainly try to not exceed 2,000 mg per day at the very most. Do not add salt when cooking or at the table.  Check the sodium amount on labels when shopping, and choose items lower in sodium when given a choice. Avoid or limit foods that already contain a lot of sodium. Eat a diet rich in fruits and vegetables and whole grains, and try to lose weight if overweight or obese You received the flu shot today; it should protect you against the flu virus over the coming months; it will take about two weeks for antibodies to develop; do try to stay away from hospitals, nursing homes, and daycares during peak flu season; taking extra vitamin C daily during flu season may help you avoid getting sick

## 2017-09-08 NOTE — Assessment & Plan Note (Signed)
Controlled today 

## 2017-09-08 NOTE — Assessment & Plan Note (Signed)
Wearing aide

## 2017-09-08 NOTE — Assessment & Plan Note (Signed)
Limit fatty meats 

## 2017-09-08 NOTE — Assessment & Plan Note (Signed)
Sees cardiologist; no chest pain

## 2017-09-08 NOTE — Assessment & Plan Note (Signed)
On blood thinner; sees cardiologist; no RVR

## 2017-09-08 NOTE — Assessment & Plan Note (Signed)
Check thyroid test, adjust medicine if needed

## 2017-09-09 ENCOUNTER — Telehealth: Payer: Self-pay

## 2017-09-09 NOTE — Telephone Encounter (Signed)
Called pt informed him of lab results. Pt gave verbal understanding.  

## 2017-09-09 NOTE — Telephone Encounter (Signed)
-----   Message from Kerman PasseyMelinda P Lada, MD sent at 09/09/2017  9:38 AM EST ----- Please let pt know that his labs appear pretty stable; his sodium and chloride are a little low; make sure he is not drinking too many liquids (we don't want him drinking more than about 64-72 ounces a day); his cholesterol is wonderful; thank you

## 2017-09-10 ENCOUNTER — Other Ambulatory Visit: Payer: Self-pay

## 2017-09-10 ENCOUNTER — Encounter: Payer: Self-pay | Admitting: Emergency Medicine

## 2017-09-10 ENCOUNTER — Emergency Department: Payer: Medicare Other

## 2017-09-10 ENCOUNTER — Inpatient Hospital Stay
Admission: EM | Admit: 2017-09-10 | Discharge: 2017-09-16 | DRG: 643 | Disposition: A | Discharge status: Home Health | Payer: Medicare Other | Attending: Internal Medicine | Admitting: Internal Medicine

## 2017-09-10 DIAGNOSIS — E222 Syndrome of inappropriate secretion of antidiuretic hormone: Principal | ICD-10-CM | POA: Diagnosis present

## 2017-09-10 DIAGNOSIS — Z79890 Hormone replacement therapy (postmenopausal): Secondary | ICD-10-CM

## 2017-09-10 DIAGNOSIS — Z87442 Personal history of urinary calculi: Secondary | ICD-10-CM

## 2017-09-10 DIAGNOSIS — E871 Hypo-osmolality and hyponatremia: Secondary | ICD-10-CM | POA: Diagnosis not present

## 2017-09-10 DIAGNOSIS — Z96698 Presence of other orthopedic joint implants: Secondary | ICD-10-CM | POA: Diagnosis present

## 2017-09-10 DIAGNOSIS — I4891 Unspecified atrial fibrillation: Secondary | ICD-10-CM | POA: Diagnosis present

## 2017-09-10 DIAGNOSIS — I10 Essential (primary) hypertension: Secondary | ICD-10-CM | POA: Diagnosis not present

## 2017-09-10 DIAGNOSIS — I5021 Acute systolic (congestive) heart failure: Secondary | ICD-10-CM | POA: Diagnosis present

## 2017-09-10 DIAGNOSIS — Z7982 Long term (current) use of aspirin: Secondary | ICD-10-CM

## 2017-09-10 DIAGNOSIS — R531 Weakness: Secondary | ICD-10-CM

## 2017-09-10 DIAGNOSIS — I2510 Atherosclerotic heart disease of native coronary artery without angina pectoris: Secondary | ICD-10-CM | POA: Diagnosis present

## 2017-09-10 DIAGNOSIS — Z800 Family history of malignant neoplasm of digestive organs: Secondary | ICD-10-CM

## 2017-09-10 DIAGNOSIS — R079 Chest pain, unspecified: Secondary | ICD-10-CM | POA: Diagnosis not present

## 2017-09-10 DIAGNOSIS — I509 Heart failure, unspecified: Secondary | ICD-10-CM

## 2017-09-10 DIAGNOSIS — Z515 Encounter for palliative care: Secondary | ICD-10-CM

## 2017-09-10 DIAGNOSIS — I110 Hypertensive heart disease with heart failure: Secondary | ICD-10-CM | POA: Diagnosis present

## 2017-09-10 DIAGNOSIS — R7989 Other specified abnormal findings of blood chemistry: Secondary | ICD-10-CM | POA: Diagnosis not present

## 2017-09-10 DIAGNOSIS — J9811 Atelectasis: Secondary | ICD-10-CM | POA: Diagnosis not present

## 2017-09-10 DIAGNOSIS — Z825 Family history of asthma and other chronic lower respiratory diseases: Secondary | ICD-10-CM

## 2017-09-10 DIAGNOSIS — R0602 Shortness of breath: Secondary | ICD-10-CM

## 2017-09-10 DIAGNOSIS — D649 Anemia, unspecified: Secondary | ICD-10-CM | POA: Diagnosis present

## 2017-09-10 DIAGNOSIS — E039 Hypothyroidism, unspecified: Secondary | ICD-10-CM | POA: Diagnosis present

## 2017-09-10 DIAGNOSIS — Z66 Do not resuscitate: Secondary | ICD-10-CM

## 2017-09-10 DIAGNOSIS — E785 Hyperlipidemia, unspecified: Secondary | ICD-10-CM | POA: Diagnosis not present

## 2017-09-10 DIAGNOSIS — R778 Other specified abnormalities of plasma proteins: Secondary | ICD-10-CM

## 2017-09-10 DIAGNOSIS — Z96649 Presence of unspecified artificial hip joint: Secondary | ICD-10-CM | POA: Diagnosis present

## 2017-09-10 DIAGNOSIS — Z79899 Other long term (current) drug therapy: Secondary | ICD-10-CM

## 2017-09-10 DIAGNOSIS — H9113 Presbycusis, bilateral: Secondary | ICD-10-CM | POA: Diagnosis present

## 2017-09-10 LAB — CBC
HCT: 37.6 % — ABNORMAL LOW (ref 40.0–52.0)
Hemoglobin: 12.5 g/dL — ABNORMAL LOW (ref 13.0–18.0)
MCH: 28.9 pg (ref 26.0–34.0)
MCHC: 33.2 g/dL (ref 32.0–36.0)
MCV: 87.2 fL (ref 80.0–100.0)
PLATELETS: 169 10*3/uL (ref 150–440)
RBC: 4.31 MIL/uL — ABNORMAL LOW (ref 4.40–5.90)
RDW: 15.5 % — ABNORMAL HIGH (ref 11.5–14.5)
WBC: 8.1 10*3/uL (ref 3.8–10.6)

## 2017-09-10 LAB — BASIC METABOLIC PANEL
Anion gap: 13 (ref 5–15)
BUN: 14 mg/dL (ref 6–20)
CALCIUM: 8.6 mg/dL — AB (ref 8.9–10.3)
CO2: 24 mmol/L (ref 22–32)
CREATININE: 0.62 mg/dL (ref 0.61–1.24)
Chloride: 87 mmol/L — ABNORMAL LOW (ref 101–111)
Glucose, Bld: 129 mg/dL — ABNORMAL HIGH (ref 65–99)
Potassium: 4.3 mmol/L (ref 3.5–5.1)
Sodium: 124 mmol/L — ABNORMAL LOW (ref 135–145)

## 2017-09-10 LAB — URINALYSIS, COMPLETE (UACMP) WITH MICROSCOPIC
Bacteria, UA: NONE SEEN
Bilirubin Urine: NEGATIVE
Glucose, UA: NEGATIVE mg/dL
Ketones, ur: 5 mg/dL — AB
Leukocytes, UA: NEGATIVE
Nitrite: NEGATIVE
PH: 5 (ref 5.0–8.0)
Protein, ur: 100 mg/dL — AB
SPECIFIC GRAVITY, URINE: 1.019 (ref 1.005–1.030)

## 2017-09-10 LAB — TROPONIN I: TROPONIN I: 0.05 ng/mL — AB (ref <0.03)

## 2017-09-10 MED ORDER — DILTIAZEM HCL 25 MG/5ML IV SOLN
20.0000 mg | Freq: Once | INTRAVENOUS | Status: AC
  Administered 2017-09-10: 20 mg via INTRAVENOUS
  Filled 2017-09-10: qty 5

## 2017-09-10 MED ORDER — ONDANSETRON HCL 4 MG/2ML IJ SOLN
4.0000 mg | Freq: Once | INTRAMUSCULAR | Status: AC
  Administered 2017-09-10: 4 mg via INTRAVENOUS
  Filled 2017-09-10: qty 2

## 2017-09-10 MED ORDER — SODIUM CHLORIDE 0.9 % IV SOLN
Freq: Once | INTRAVENOUS | Status: AC
  Administered 2017-09-10: via INTRAVENOUS

## 2017-09-10 NOTE — H&P (Signed)
[facility] - [place] [facility]   PATIENT NAME: Kirk Sanchez    MR#:  [id number]  DATE OF BIRTH:  1924-11-30  DATE OF ADMISSION:  09/10/2017  PRIMARY CARE PHYSICIAN: Kerman Passey, MD   REQUESTING/REFERRING PHYSICIAN: Derrill Kay, MD  CHIEF COMPLAINT:   Chief Complaint  Patient presents with  . Chest Pain    HISTORY OF PRESENT ILLNESS:  Kirk Sanchez  is a 84 y.o. male who presents with chest pain, malaise, generalized weakness.  Patient states he has been feeling bad for the past couple of days, today he started developing some palpitations and significant chest pain.  He does have a history of atrial fibrillation, and presented to the ED today in A. fib with RVR.  His rate was moderately controlled initially with some IV diltiazem.  He had a positive troponin, and he was found to be mildly hyponatremic.  Hospitalist were called for admission  PAST MEDICAL HISTORY:   Past Medical History:  Diagnosis Date  . Atrial fibrillation (HCC)   . Hyperlipidemia   . Hypertension   . Kidney stone   . Presbycusis of both ears 03/06/2016  . Thyroid disease     PAST SURGICAL HISTORY:   Past Surgical History:  Procedure Laterality Date  . HAND ARTHROPLASTY    . HIP ARTHROPLASTY    . STENT PLACEMENT RT URETER ([facility] HX)      SOCIAL HISTORY:   Social History   Tobacco Use  . Smoking status: Never Smoker  . Smokeless tobacco: Never Used  Substance Use Topics  . Alcohol use: No    Alcohol/week: 0.0 oz    FAMILY HISTORY:   Family History  Problem Relation Age of Onset  . Cancer Mother        stomach  . Asthma Father   . Stroke Neg Hx   . Heart disease Neg Hx     DRUG ALLERGIES:  No Known Allergies  MEDICATIONS AT HOME:   Prior to Admission medications   Medication Sig Start Date End Date Taking? Authorizing Provider  aspirin 81 MG tablet Take 81 mg by mouth daily.    [provider]  Calcium Carb-Cholecalciferol (CALCIUM-VITAMIN D)  500-200 MG-UNIT tablet Take 1 tablet by mouth 2 (two) times daily.     [provider]  levothyroxine (SYNTHROID, LEVOTHROID) 25 MCG tablet TAKE 1 TABLET BY MOUTH ONCE DAILY ON AN EMPTY STOMACH. WAIT 30 MINUTES BEFORE TAKING OTHER MEDS. 06/24/17   Kerman Passey, MD  magnesium oxide (MAG-OX) 400 MG tablet Take 400 mg by mouth daily.     [provider]  metoprolol succinate (TOPROL-XL) 50 MG 24 hr tablet Take 50 mg by mouth daily. 05/09/17   [provider]  ofloxacin (OCUFLOX) 0.3 % ophthalmic solution Use 1 drop three times a day to the surgical eye for one week. Start after surgery 03/16/17   [provider]  Omega-3 Fatty Acids (FISH OIL PO) Take by mouth daily.     [provider]  prednisoLONE acetate (PRED FORTE) 1 % ophthalmic suspension  06/22/17   [provider]  ranitidine (ZANTAC) 150 MG tablet Take 1 tablet (150 mg total) by mouth 2 (two) times daily. If needed for heartburn 09/08/16   Kerman Passey, MD  simvastatin (ZOCOR) 40 MG tablet One by mouth every evening for cholesterol 03/03/17   Lada, Janit Bern, MD    REVIEW OF SYSTEMS:  Review of Systems  Constitutional: Positive for malaise/fatigue. Negative  for chills, fever and weight loss.  HENT: Negative for ear pain, hearing loss and tinnitus.   Eyes: Negative for blurred vision, double vision, pain and redness.  Respiratory: Negative for cough, hemoptysis and shortness of breath.   Cardiovascular: Positive for chest pain and palpitations. Negative for orthopnea and leg swelling.  Gastrointestinal: Negative for abdominal pain, constipation, diarrhea, nausea and vomiting.  Genitourinary: Negative for dysuria, frequency and hematuria.  Musculoskeletal: Negative for back pain, joint pain and neck pain.  Skin:       No acne, rash, or lesions  Neurological: Positive for weakness. Negative for dizziness, tremors and focal weakness.  Endo/Heme/Allergies: Negative for polydipsia. Does  not bruise/bleed easily.  Psychiatric/Behavioral: Negative for depression. The patient is not nervous/anxious and does not have insomnia.      VITAL SIGNS:   Vitals:   09/10/17 2045 09/10/17 2100 09/10/17 2115 09/10/17 2130  BP:  (!) 156/96  (!) 142/77  Pulse: (!) 52  92 94  Resp: (!) 28 (!) 29 (!) 23 (!) 29  Temp:      TempSrc:      SpO2: 95%  93% 95%  Weight:      Height:       Wt Readings from Last 3 Encounters:  09/10/17 86.6 kg (191 lb)  09/08/17 87 kg (191 lb 12.8 oz)  05/15/17 83.9 kg (185 lb)    PHYSICAL EXAMINATION:  Physical Exam  Vitals reviewed. Constitutional: He is oriented to person, place, and time. He appears well-developed and well-nourished. No distress.  HENT:  Head: Normocephalic and atraumatic.  Mouth/Throat: Oropharynx is clear and moist.  Eyes: Conjunctivae and EOM are normal. Pupils are equal, round, and reactive to light. No scleral icterus.  Neck: Normal range of motion. Neck supple. No JVD present. No thyromegaly present.  Cardiovascular: Intact distal pulses. Exam reveals no gallop and no friction rub.  No murmur heard. Tachycardic, irregular rhythm  Respiratory: Effort normal. No respiratory distress. He has no wheezes. He has no rales.  Mild diffuse coarse sounds  GI: Soft. Bowel sounds are normal. He exhibits no distension. There is no tenderness.  Musculoskeletal: Normal range of motion. He exhibits no edema.  No arthritis, no gout  Lymphadenopathy:    He has no cervical adenopathy.  Neurological: He is alert and oriented to person, place, and time. No cranial nerve deficit.  No dysarthria, no aphasia  Skin: Skin is warm and dry. No rash noted. No erythema.  Psychiatric: He has a normal mood and affect. His behavior is normal. Judgment and thought content normal.    LABORATORY PANEL:   CBC Recent Labs  Lab 09/10/17 2035  WBC 8.1  HGB 12.5*  HCT 37.6*  PLT 169    ------------------------------------------------------------------------------------------------------------------  Chemistries  Recent Labs  Lab 09/08/17 1054 09/10/17 2035  NA 133* 124*  K 4.6 4.3  CL 96* 87*  CO2 26 24  GLUCOSE 89 129*  BUN 15 14  CREATININE 0.73 0.62  CALCIUM 9.0 8.6*  AST 20  --   ALT 12  --   BILITOT 0.7  --    ------------------------------------------------------------------------------------------------------------------  Cardiac Enzymes Recent Labs  Lab 09/10/17 2035  TROPONINI 0.05*   ------------------------------------------------------------------------------------------------------------------  RADIOLOGY:  Dg Chest 2 View  Result Date: 09/10/2017 CLINICAL DATA:  Chest pain EXAM: CHEST  2 VIEW COMPARISON:  04/08/2016, 07/24/2014 FINDINGS: Mildly low lung volumes. Streaky bibasilar atelectasis. Bronchitic changes at the bases. No focal consolidation or pleural effusion cardiomegaly with aortic atherosclerosis. No pneumothorax. Small  hiatal hernia. IMPRESSION: Bronchitic changes at the bases with streaky atelectasis. Cardiomegaly. Electronically Signed   By: Jasmine PangKim  Fujinaga M.D.   On: 09/10/2017 21:55    EKG:   Orders placed or performed during the hospital encounter of 09/10/17  . EKG 12-Lead  . EKG 12-Lead  . ED EKG within 10 minutes  . ED EKG within 10 minutes    IMPRESSION AND PLAN:  Principal Problem:   Atrial fibrillation with RVR (HCC) -rate is somewhat controlled with IV diltiazem dose.  We will continue his home dose of Lopressor, and may give another dose of IV diltiazem to bring his rate down to under 110 ideally, or even under 100 if we can get it there based on blood pressure tolerance.  We will trend his cardiac enzymes tonight, though I suspect that they are mildly elevated due to strain from the RVR.  Will also get an echocardiogram and cardiology consult Active Problems:   Hyponatremia -IV saline initially to bring his  sodium up.  He was found to be at 124 tonight, which is only mildly down from what seems to be his baseline of the low 130s   Arteriosclerosis of coronary artery -continue home meds, other workup as above   BP (high blood pressure) -continue home medications   HLD (hyperlipidemia) -home dose statin   Hypothyroidism -home dose thyroid replacement  All the records are reviewed and case discussed with ED provider. Management plans discussed with the patient and/or family.  DVT PROPHYLAXIS: SubQ lovenox  GI PROPHYLAXIS: H2 blocker  ADMISSION STATUS: Observation  CODE STATUS: Full Code Status History    This patient does not have a recorded code status. Please follow your organizational policy for patients in this situation.      TOTAL TIME TAKING CARE OF THIS PATIENT: 40 minutes.   Janellie Tennison FIELDING 09/10/2017, 11:10 PM  [facility]  Office  [phone]  CC: Primary care physician; Kerman PasseyLada, Melinda P, MD  Note:  This document was prepared using Dragon voice recognition software and may include unintentional dictation errors.

## 2017-09-10 NOTE — ED Triage Notes (Signed)
Pt reports that he started having chest pain yesterday and has had increasing pain today mid chest. Pt denies other cardiac symptoms at this time. Pt is reporting that his chest pain is causing difficulty breathing.

## 2017-09-10 NOTE — ED Provider Notes (Signed)
[facility] Emergency Department Provider Note  ____________________________________________   I have reviewed the triage vital signs and the nursing notes.   HISTORY  Chief Complaint Chest Pain   History limited by: Not Limited   HPI Kirk Sanchez is a 84 y.o. male who presents to the emergency department today with concern for chest pain.  The patient states he has not been feeling well for the past week.  He has been feeling off.  He states he has had some congestion.  He noticed a little bit of shortness of breath and discomfort yesterday however this morning when he woke up it was acutely worse.  He denies similar symptoms in the past.    Per medical record review patient has a history of atrial fibrillation.  Past Medical History:  Diagnosis Date  . Atrial fibrillation (HCC)   . Hyperlipidemia   . Hypertension   . Kidney stone   . Presbycusis of both ears 03/06/2016  . Thyroid disease     Patient Active Problem List   Diagnosis Date Noted  . Right shoulder tendonitis 03/08/2017  . Atrial fibrillation (HCC) 03/06/2016  . Presbycusis of both ears 03/06/2016  . Medication monitoring encounter 03/03/2016  . Anemia 03/03/2016  . Arteriosclerosis of coronary artery 01/28/2015  . Cardiac murmur 01/28/2015  . HLD (hyperlipidemia) 01/28/2015  . BP (high blood pressure) 01/28/2015  . Hypothyroidism 01/28/2015    Past Surgical History:  Procedure Laterality Date  . HAND ARTHROPLASTY    . HIP ARTHROPLASTY    . STENT PLACEMENT RT URETER ([facility] HX)      Prior to Admission medications   Medication Sig Start Date End Date Taking? Authorizing Provider  aspirin 81 MG tablet Take 81 mg by mouth daily.    [provider]  Calcium Carb-Cholecalciferol (CALCIUM-VITAMIN D) 500-200 MG-UNIT tablet Take 1 tablet by mouth 2 (two) times daily.     [provider]  levothyroxine (SYNTHROID, LEVOTHROID) 25 MCG tablet TAKE 1 TABLET BY MOUTH ONCE  DAILY ON AN EMPTY STOMACH. WAIT 30 MINUTES BEFORE TAKING OTHER MEDS. 06/24/17   Kerman Passey, MD  magnesium oxide (MAG-OX) 400 MG tablet Take 400 mg by mouth daily.     [provider]  metoprolol succinate (TOPROL-XL) 50 MG 24 hr tablet Take 50 mg by mouth daily. 05/09/17   [provider]  ofloxacin (OCUFLOX) 0.3 % ophthalmic solution Use 1 drop three times a day to the surgical eye for one week. Start after surgery 03/16/17   [provider]  Omega-3 Fatty Acids (FISH OIL PO) Take by mouth daily.     [provider]  prednisoLONE acetate (PRED FORTE) 1 % ophthalmic suspension  06/22/17   [provider]  ranitidine (ZANTAC) 150 MG tablet Take 1 tablet (150 mg total) by mouth 2 (two) times daily. If needed for heartburn 09/08/16   Kerman Passey, MD  simvastatin (ZOCOR) 40 MG tablet One by mouth every evening for cholesterol 03/03/17   Lada, Janit Bern, MD    Allergies Patient has no known allergies.  Family History  Problem Relation Age of Onset  . Cancer Mother        stomach  . Asthma Father   . Stroke Neg Hx   . Heart disease Neg Hx     Social History Social History   Tobacco Use  . Smoking status: Never Smoker  . Smokeless tobacco: Never Used  Substance Use Topics  . Alcohol use: No  Alcohol/week: 0.0 oz  . Drug use: No    Review of Systems Constitutional: No fever/chills Eyes: No visual changes. ENT: No sore throat. Cardiovascular: Positive for chest pain. Respiratory: Positive for shortness of breath. Gastrointestinal: No abdominal pain.  No nausea, no vomiting.  No diarrhea.   Genitourinary: Negative for dysuria. Musculoskeletal: Negative for back pain. Skin: Negative for rash. Neurological: Negative for headaches, focal weakness or numbness.  ____________________________________________   PHYSICAL EXAM:  VITAL SIGNS: ED Triage Vitals  Enc Vitals Group     BP 09/10/17 1959 (!) 148/98     Pulse Rate 09/10/17  1959 68     Resp 09/10/17 1959 18     Temp 09/10/17 1959 (!) 97.5 F (36.4 C)     Temp Source 09/10/17 1959 Oral     SpO2 09/10/17 1959 92 %     Weight 09/10/17 2001 191 lb (86.6 kg)     Height 09/10/17 2001 5\' 8"  (1.727 m)   Constitutional: Alert and oriented. Well appearing and in no distress. Eyes: Conjunctivae are normal.  ENT   Head: Normocephalic and atraumatic.   Nose: No congestion/rhinnorhea.   Mouth/Throat: Mucous membranes are moist.   Neck: No stridor. Hematological/Lymphatic/Immunilogical: No cervical lymphadenopathy. Cardiovascular: Irregularly irregular rhythm.  No murmurs, rubs, or gallops.  Respiratory: Normal respiratory effort without tachypnea nor retractions. Breath sounds are clear and equal bilaterally. No wheezes/rales/rhonchi. Gastrointestinal: Soft and non tender. No rebound. No guarding.  Genitourinary: Deferred Musculoskeletal: Normal range of motion in all extremities. No lower extremity edema. Neurologic:  Normal speech and language. No gross focal neurologic deficits are appreciated.  Skin:  Skin is warm, dry and intact. No rash noted. Psychiatric: Mood and affect are normal. Speech and behavior are normal. Patient exhibits appropriate insight and judgment.  ____________________________________________    LABS (pertinent positives/negatives)  CBC wbc 8.1, hgb 12.5, plt 169 Trop 0.05 BMP na 124, k 4.3, glu 129 UA not consistent with infection ____________________________________________   EKG  I, Phineas SemenGraydon Janna Oak, attending physician, personally viewed and interpreted this EKG  EKG Time: 1959 Rate: 114 Rhythm: atrial fibrillation with RVR Axis: left axis deviation Intervals: qtc 490 QRS: intraventricular block ST changes: no st elevation Impression: abnormal ekg  ____________________________________________    RADIOLOGY  CXR Cardiomegaly, no acute  disease  ____________________________________________   PROCEDURES  Procedures  CRITICAL CARE Performed by: Phineas SemenGOODMAN, Melysa Schroyer   Total critical care time: 35 minutes  Critical care time was exclusive of separately billable procedures and treating other patients.  Critical care was necessary to treat or prevent imminent or life-threatening deterioration.  Critical care was time spent personally by me on the following activities: development of treatment plan with patient and/or surrogate as well as nursing, discussions with consultants, evaluation of patient's response to treatment, examination of patient, obtaining history from patient or surrogate, ordering and performing treatments and interventions, ordering and review of laboratory studies, ordering and review of radiographic studies, pulse oximetry and re-evaluation of patient's condition.  ____________________________________________   INITIAL IMPRESSION / ASSESSMENT AND PLAN / ED COURSE  Pertinent labs & imaging results that were available during my care of the patient were reviewed by me and considered in my medical decision making (see chart for details).  Patient presents to the emergency department today because of concerns for chest pain.  EKG did show A. fib with RVR.  Differential would certainly be cardiac ischemia, electrolyte abnormality, infection, dehydration all creating fast and irregular heart rhythm amongst other etiologies.  Patient's workup showed  a mildly elevated troponin of 0.05.  At this point I do wonder if it was simply demand ischemia from the atrial fibrillation.  Additionally patient's sodium was found to be low at 124.  This could explain the patient's weakness.  Per chart review does have a history of A. fib.  Patient was given a diltiazem which did help bring his heart rate down.  Given constellation of findings will plan on admission to the hospitalist service. Discussed findings and plan with patient  and family.   ____________________________________________   FINAL CLINICAL IMPRESSION(S) / ED DIAGNOSES  Final diagnoses:  Atrial fibrillation with RVR (HCC)  Chest pain, unspecified type  Hyponatremia  Elevated troponin     Note: This dictation was prepared with Dragon dictation. Any transcriptional errors that result from this process are unintentional     Phineas Semen, MD 09/11/17 1650

## 2017-09-10 NOTE — ED Notes (Signed)
Pt lives alone and family was visiting today and felt that he just wasn't acting himself. Daughter in law here with him. Pt is very hard of hearing

## 2017-09-10 NOTE — ED Notes (Signed)
Date and time results received: 09/10/17 2117 (use smartphrase ".now" to insert current time)  Test: Trop Critical Value: 0.05  Name of Provider Notified: Derrill KayGoodman  Orders Received? Or Actions Taken?: aware orders pending

## 2017-09-11 ENCOUNTER — Observation Stay
Admit: 2017-09-11 | Discharge: 2017-09-11 | Disposition: A | Discharge status: Home / Self Care | Payer: Medicare Other | Attending: Internal Medicine | Admitting: Internal Medicine

## 2017-09-11 DIAGNOSIS — I2510 Atherosclerotic heart disease of native coronary artery without angina pectoris: Secondary | ICD-10-CM | POA: Diagnosis not present

## 2017-09-11 DIAGNOSIS — I482 Chronic atrial fibrillation: Secondary | ICD-10-CM | POA: Diagnosis not present

## 2017-09-11 DIAGNOSIS — E871 Hypo-osmolality and hyponatremia: Secondary | ICD-10-CM | POA: Diagnosis not present

## 2017-09-11 DIAGNOSIS — I4891 Unspecified atrial fibrillation: Secondary | ICD-10-CM | POA: Diagnosis not present

## 2017-09-11 DIAGNOSIS — E785 Hyperlipidemia, unspecified: Secondary | ICD-10-CM | POA: Diagnosis not present

## 2017-09-11 LAB — TROPONIN I
TROPONIN I: 0.04 ng/mL — AB (ref <0.03)
Troponin I: 0.07 ng/mL (ref <0.03)
Troponin I: 0.08 ng/mL (ref <0.03)

## 2017-09-11 LAB — BASIC METABOLIC PANEL
ANION GAP: 12 (ref 5–15)
BUN: 12 mg/dL (ref 6–20)
CHLORIDE: 87 mmol/L — AB (ref 101–111)
CO2: 26 mmol/L (ref 22–32)
Calcium: 8.4 mg/dL — ABNORMAL LOW (ref 8.9–10.3)
Creatinine, Ser: 0.61 mg/dL (ref 0.61–1.24)
GFR calc non Af Amer: >60 mL/min (ref >60)
Glucose, Bld: 152 mg/dL — ABNORMAL HIGH (ref 65–99)
Potassium: 4.2 mmol/L (ref 3.5–5.1)
Sodium: 125 mmol/L — ABNORMAL LOW (ref 135–145)

## 2017-09-11 LAB — CBC
HCT: 35.3 % — ABNORMAL LOW (ref 40.0–52.0)
Hemoglobin: 11.7 g/dL — ABNORMAL LOW (ref 13.0–18.0)
MCH: 29 pg (ref 26.0–34.0)
MCHC: 33.1 g/dL (ref 32.0–36.0)
MCV: 87.5 fL (ref 80.0–100.0)
Platelets: 152 10*3/uL (ref 150–440)
RBC: 4.03 MIL/uL — AB (ref 4.40–5.90)
RDW: 15.7 % — ABNORMAL HIGH (ref 11.5–14.5)
WBC: 8 10*3/uL (ref 3.8–10.6)

## 2017-09-11 LAB — ECHOCARDIOGRAM COMPLETE
FS: 16 % — AB (ref 28–44)
Height: 68 in
IV/PV OW: 1
LA ID, A-P, ES: 48 mm
LA diam index: 2.33 cm/m2
LA vol A4C: 76.6 ml
LA vol: 83.4 mL
LAVOLIN: 40.4 mL/m2
LEFT ATRIUM END SYS DIAM: 48 mm
PW: 18.5 mm — AB (ref 0.6–1.1)
RV LATERAL S' VELOCITY: 11.2 cm/s
WEIGHTICAEL: 3064 [oz_av]

## 2017-09-11 MED ORDER — FAMOTIDINE 20 MG PO TABS
20.0000 mg | ORAL_TABLET | Freq: Two times a day (BID) | ORAL | Status: DC
  Administered 2017-09-11 – 2017-09-16 (×12): 20 mg via ORAL
  Filled 2017-09-11 (×12): qty 1

## 2017-09-11 MED ORDER — SIMVASTATIN 20 MG PO TABS
40.0000 mg | ORAL_TABLET | Freq: Every day | ORAL | Status: DC
  Administered 2017-09-11 – 2017-09-15 (×5): 40 mg via ORAL
  Filled 2017-09-11 (×5): qty 2

## 2017-09-11 MED ORDER — ACETAMINOPHEN 650 MG RE SUPP
650.0000 mg | Freq: Four times a day (QID) | RECTAL | Status: DC | PRN
Start: 2017-09-11 — End: 2017-09-16

## 2017-09-11 MED ORDER — DILTIAZEM HCL 25 MG/5ML IV SOLN
20.0000 mg | Freq: Once | INTRAVENOUS | Status: AC
  Administered 2017-09-11: 20 mg via INTRAVENOUS

## 2017-09-11 MED ORDER — ONDANSETRON HCL 4 MG PO TABS: 4.0000 mg | ORAL_TABLET | Freq: Four times a day (QID) | ORAL | Status: DC | PRN

## 2017-09-11 MED ORDER — LEVOTHYROXINE SODIUM 25 MCG PO TABS
25.0000 ug | ORAL_TABLET | Freq: Every day | ORAL | Status: DC
  Administered 2017-09-11 – 2017-09-16 (×6): 25 ug via ORAL
  Filled 2017-09-11 (×6): qty 1

## 2017-09-11 MED ORDER — ACETAMINOPHEN 325 MG PO TABS: 650.0000 mg | ORAL_TABLET | Freq: Four times a day (QID) | ORAL | Status: DC | PRN

## 2017-09-11 MED ORDER — ONDANSETRON HCL 4 MG/2ML IJ SOLN: 4.0000 mg | Freq: Four times a day (QID) | INTRAMUSCULAR | Status: DC | PRN

## 2017-09-11 MED ORDER — AZITHROMYCIN 250 MG PO TABS
500.0000 mg | ORAL_TABLET | Freq: Every day | ORAL | Status: AC
  Administered 2017-09-11 – 2017-09-14 (×5): 500 mg via ORAL
  Filled 2017-09-11 (×5): qty 2

## 2017-09-11 MED ORDER — ASPIRIN 81 MG PO CHEW
81.0000 mg | CHEWABLE_TABLET | Freq: Every day | ORAL | Status: DC
  Administered 2017-09-11 – 2017-09-16 (×6): 81 mg via ORAL
  Filled 2017-09-11 (×6): qty 1

## 2017-09-11 MED ORDER — FUROSEMIDE 10 MG/ML IJ SOLN
20.0000 mg | Freq: Once | INTRAMUSCULAR | Status: AC
  Administered 2017-09-11: 20 mg via INTRAVENOUS

## 2017-09-11 MED ORDER — METOPROLOL SUCCINATE ER 50 MG PO TB24
50.0000 mg | ORAL_TABLET | Freq: Every day | ORAL | Status: DC
  Administered 2017-09-11 – 2017-09-16 (×6): 50 mg via ORAL
  Filled 2017-09-11 (×6): qty 1

## 2017-09-11 MED ORDER — FUROSEMIDE 10 MG/ML IJ SOLN
INTRAMUSCULAR | Status: AC
  Filled 2017-09-11: qty 2

## 2017-09-11 MED ORDER — DILTIAZEM HCL 25 MG/5ML IV SOLN
INTRAVENOUS | Status: AC
  Administered 2017-09-11: 20 mg via INTRAVENOUS
  Filled 2017-09-11: qty 5

## 2017-09-11 MED ORDER — SODIUM CHLORIDE 0.9 % IV SOLN
INTRAVENOUS | Status: DC
  Administered 2017-09-11: 01:00:00 via INTRAVENOUS

## 2017-09-11 MED ORDER — ENOXAPARIN SODIUM 40 MG/0.4ML ~~LOC~~ SOLN
40.0000 mg | SUBCUTANEOUS | Status: DC
  Administered 2017-09-11 – 2017-09-15 (×5): 40 mg via SUBCUTANEOUS
  Filled 2017-09-11 (×5): qty 0.4

## 2017-09-11 MED ORDER — DILTIAZEM HCL 25 MG/5ML IV SOLN
10.0000 mg | Freq: Once | INTRAVENOUS | Status: AC
  Administered 2017-09-11: 10 mg via INTRAVENOUS
  Filled 2017-09-11: qty 5

## 2017-09-11 MED ORDER — FUROSEMIDE 10 MG/ML IJ SOLN
20.0000 mg | Freq: Once | INTRAMUSCULAR | Status: AC
  Administered 2017-09-11: 20 mg via INTRAVENOUS
  Filled 2017-09-11: qty 2

## 2017-09-11 NOTE — Progress Notes (Signed)
[facility] -  at [facility]   PATIENT NAME: Newel Oien    MR#:  [id number]  DATE OF BIRTH:  1925-07-08  SUBJECTIVE:  CHIEF COMPLAINT: Patient denies any chest pain or palpitations today.  Very hard of hearing  REVIEW OF SYSTEMS:  CONSTITUTIONAL: No fever, fatigue or weakness.  EYES: No blurred or double vision.  EARS, NOSE, AND THROAT: No tinnitus or ear pain.  RESPIRATORY: No cough, shortness of breath, wheezing or hemoptysis.  CARDIOVASCULAR: No chest pain, orthopnea, edema.  GASTROINTESTINAL: No nausea, vomiting, diarrhea or abdominal pain.  GENITOURINARY: No dysuria, hematuria.  ENDOCRINE: No polyuria, nocturia,  HEMATOLOGY: No anemia, easy bruising or bleeding SKIN: No rash or lesion. MUSCULOSKELETAL: No joint pain or arthritis.   NEUROLOGIC: No tingling, numbness, weakness.  PSYCHIATRY: No anxiety or depression.   DRUG ALLERGIES:  No Known Allergies  VITALS:  Blood pressure (!) 136/96, pulse (!) 102, temperature (!) 97.5 F (36.4 C), temperature source Oral, resp. rate (!) 22, height 5\' 8"  (1.727 m), weight 86.9 kg (191 lb 8 oz), SpO2 99 %.  PHYSICAL EXAMINATION:  GENERAL:  84 y.o.-year-old patient lying in the bed with no acute distress.  EYES: Pupils equal, round, reactive to light and accommodation. No scleral icterus. Extraocular muscles intact.  HEENT: Head atraumatic, normocephalic. Oropharynx and nasopharynx clear.  NECK:  Supple, no jugular venous distention. No thyroid enlargement, no tenderness.  LUNGS: Normal breath sounds bilaterally, no wheezing, rales,rhonchi or crepitation. No use of accessory muscles of respiration.  CARDIOVASCULAR: Irregularly regular. No murmurs, rubs, or gallops.  ABDOMEN: Soft, nontender, nondistended. Bowel sounds present. No organomegaly or mass.  EXTREMITIES: No pedal edema, cyanosis, or clubbing.  NEUROLOGIC: Awake and alert oriented x2-3 with intermittent episodes of confusion PSYCHIATRIC:  The patient is alert and oriented x 3.  SKIN: No obvious rash, lesion, or ulcer.    LABORATORY PANEL:   CBC Recent Labs  Lab 09/11/17 0229  WBC 8.0  HGB 11.7*  HCT 35.3*  PLT 152   ------------------------------------------------------------------------------------------------------------------  Chemistries  Recent Labs  Lab 09/08/17 1054  09/11/17 0229  NA 133*   < > 125*  K 4.6   < > 4.2  CL 96*   < > 87*  CO2 26   < > 26  GLUCOSE 89   < > 152*  BUN 15   < > 12  CREATININE 0.73   < > 0.61  CALCIUM 9.0   < > 8.4*  AST 20  --   --   ALT 12  --   --   BILITOT 0.7  --   --    < > = values in this interval not displayed.   ------------------------------------------------------------------------------------------------------------------  Cardiac Enzymes Recent Labs  Lab 09/11/17 1436  TROPONINI 0.08*   ------------------------------------------------------------------------------------------------------------------  RADIOLOGY:  Dg Chest 2 View  Result Date: 09/10/2017 CLINICAL DATA:  Chest pain EXAM: CHEST  2 VIEW COMPARISON:  04/08/2016, 07/24/2014 FINDINGS: Mildly low lung volumes. Streaky bibasilar atelectasis. Bronchitic changes at the bases. No focal consolidation or pleural effusion cardiomegaly with aortic atherosclerosis. No pneumothorax. Small hiatal hernia. IMPRESSION: Bronchitic changes at the bases with streaky atelectasis. Cardiomegaly. Electronically Signed   By: Jasmine Pang M.D.   On: 09/10/2017 21:55    EKG:   Orders placed or performed during the hospital encounter of 09/10/17  . EKG 12-Lead  . EKG 12-Lead  . ED EKG within 10 minutes  . ED EKG within 10 minutes    ASSESSMENT AND  PLAN:    # Atrial fibrillation with RVR (HCC) - rate is better with IV diltiazem dose. continue his home dose of Lopressor, and may give another dose of IV diltiazem to bring his rate down to under 110 ideally, or even under 100 if we can get it there based on  blood pressure tolerance.    echocardiogram -has revealed 40-45% of ejection fraction [facility] cardiology consult Troponins 0.05-0.04-0.07-0.08, no significant trend    # Hyponatremia -IV saline initially to bring his sodium up.   He was found to be at 124-125 Patient's baseline seems to be at 130s   # Arteriosclerosis of coronary artery -continue home meds, other workup as above    # BP (high blood pressure) -continue home medications    # HLD (hyperlipidemia) -statin    # Hypothyroidism -home dose thyroid replacement      All the records are reviewed and case discussed with Care Management/Social Workerr. Management plans discussed with the patient, family and they are in agreement.  CODE STATUS: fc   TOTAL TIME TAKING CARE OF THIS PATIENT: 36 minutes.   POSSIBLE D/C IN 1- 2  DAYS, DEPENDING ON CLINICAL CONDITION.  Note: This dictation was prepared with Dragon dictation along with smaller phrase technology. Any transcriptional errors that result from this process are unintentional.   Ramonita LabAruna Devlyn Parish M.D on 09/11/2017 at 3:33 PM  Between 7am to 6pm - Pager - [phone] After 6pm go to [email or web address] - password [email or web address] [facility]  [facility]  Office  [phone]  CC: Primary care physician; Kerman PasseyLada, Melinda P, MD

## 2017-09-11 NOTE — Progress Notes (Signed)
Patient c/o SOB and upon assessment was requiring 5L on [place] to keep SpO2 above 90%. HR sustaining between 110s-130s. Notified MD Sheryle Hailiamond and orders placed to discontinue IVF, give 20mg  lasix IV x1, and give 10mg  Cardizem IV x1. Nursing staff will continue to monitor for any changes in patient status. Lamonte RicherKara A Honesty Menta, RN

## 2017-09-11 NOTE — ED Notes (Signed)
Patient transported to 237.

## 2017-09-11 NOTE — Progress Notes (Signed)
Patient arrived to 2A Room 237. Patient denies pain and all questions answered. Patient oriented to unit and use of call bell/room phone. Skin assessment completed with Deatra Jamesakera RN and scattered ecchymosis noted. A&Ox4, VSS, and Afib on verified tele-box #[id number]. Son took patient's wallet, keys, and pocket knife home. Patient very hard of hearing. Nursing staff will continue to monitor for any changes in patient status. Lamonte RicherKara A Tiki Tucciarone, RN

## 2017-09-11 NOTE — Care Management Obs Status (Signed)
MEDICARE OBSERVATION STATUS NOTIFICATION   Patient Details  Name: Kirk Sanchez MRN: [id number] Date of Birth: 05/04/1925   Medicare Observation Status Notification Given:  Yes    Winslow Verrill A, RN 09/11/2017, 3:50 PM

## 2017-09-11 NOTE — Consult Note (Signed)
Cardiology Consultation Note    Patient ID: Kirk Sanchez, MRN: [id number], DOB/AGE: [birth year] 84 y.o. Admit date: 09/10/2017   Date of Consult: 09/11/2017 Primary Physician: Kerman Passey, MD Primary Cardiologist: Dr. Juliann Pares  Chief Complaint: sob Reason for Consultation: afib/chest pain Requesting MD: Dr. Amado Coe  HPI: Kirk Sanchez is a 84 y.o. male with history of afib, hyperlipidemia, hypertension, who was admitted after complaing of generalized fatigue, sob, chest pain and  palpitations. He was noted to have afib with rvr and interventricular conduction delay in the er and was also noted to be hyponatremic. His initial troponin was 0.04 and has increased to 0.08. CXR showed cardiomegaly with atelectasis. He feels somewhat better after rate control was improved with cardizem.   Past Medical History:  Diagnosis Date  . Atrial fibrillation (HCC)   . Hyperlipidemia   . Hypertension   . Kidney stone   . Presbycusis of both ears 03/06/2016  . Thyroid disease       Surgical History:  Past Surgical History:  Procedure Laterality Date  . HAND ARTHROPLASTY    . HIP ARTHROPLASTY    . STENT PLACEMENT RT URETER ([facility] HX)       Home Meds: Prior to Admission medications   Medication Sig Start Date End Date Taking? Authorizing Provider  aspirin 81 MG tablet Take 81 mg by mouth daily.    [provider]  Calcium Carb-Cholecalciferol (CALCIUM-VITAMIN D) 500-200 MG-UNIT tablet Take 1 tablet by mouth 2 (two) times daily.     [provider]  levothyroxine (SYNTHROID, LEVOTHROID) 25 MCG tablet TAKE 1 TABLET BY MOUTH ONCE DAILY ON AN EMPTY STOMACH. WAIT 30 MINUTES BEFORE TAKING OTHER MEDS. 06/24/17   Kerman Passey, MD  magnesium oxide (MAG-OX) 400 MG tablet Take 400 mg by mouth daily.     [provider]  metoprolol succinate (TOPROL-XL) 50 MG 24 hr tablet Take 50 mg by mouth daily. 05/09/17   [provider]  ofloxacin (OCUFLOX) 0.3 % ophthalmic  solution Use 1 drop three times a day to the surgical eye for one week. Start after surgery 03/16/17   [provider]  Omega-3 Fatty Acids (FISH OIL PO) Take by mouth daily.     [provider]  prednisoLONE acetate (PRED FORTE) 1 % ophthalmic suspension  06/22/17   [provider]  ranitidine (ZANTAC) 150 MG tablet Take 1 tablet (150 mg total) by mouth 2 (two) times daily. If needed for heartburn 09/08/16   Kerman Passey, MD  simvastatin (ZOCOR) 40 MG tablet One by mouth every evening for cholesterol 03/03/17   Kerman Passey, MD    Inpatient Medications:  . aspirin  81 mg Oral Daily  . azithromycin  500 mg Oral Daily  . enoxaparin (LOVENOX) injection  40 mg Subcutaneous Q24H  . famotidine  20 mg Oral BID  . furosemide      . furosemide  20 mg Intravenous Once  . levothyroxine  25 mcg Oral QAC breakfast  . metoprolol succinate  50 mg Oral Daily  . simvastatin  40 mg Oral q1800     Allergies: No Known Allergies  Social History   Socioeconomic History  . Marital status: Widowed    Spouse name: Not on file  . Number of children: Not on file  . Years of education: Not on file  . Highest education level: Not on file  Social Needs  . Financial resource strain: Not on file  . Food  insecurity - worry: Not on file  . Food insecurity - inability: Not on file  . Transportation needs - medical: Not on file  . Transportation needs - non-medical: Not on file  Occupational History  . Not on file  Tobacco Use  . Smoking status: Never Smoker  . Smokeless tobacco: Never Used  Substance and Sexual Activity  . Alcohol use: No    Alcohol/week: 0.0 oz  . Drug use: No  . Sexual activity: No  Other Topics Concern  . Not on file  Social History Narrative  . Not on file     Family History  Problem Relation Age of Onset  . Cancer Mother        stomach  . Asthma Father   . Stroke Neg Hx   . Heart disease Neg Hx      Review of Systems: A 12-system review of  systems was performed and is negative except as noted in the HPI.  Labs: Recent Labs    09/10/17 2035 09/11/17 0229 09/11/17 0758 09/11/17 1436  TROPONINI 0.05* 0.04* 0.07* 0.08*   Lab Results  Component Value Date   WBC 8.0 09/11/2017   HGB 11.7 (L) 09/11/2017   HCT 35.3 (L) 09/11/2017   MCV 87.5 09/11/2017   PLT 152 09/11/2017    Recent Labs  Lab 09/08/17 1054  09/11/17 0229  NA 133*   < > 125*  K 4.6   < > 4.2  CL 96*   < > 87*  CO2 26   < > 26  BUN 15   < > 12  CREATININE 0.73   < > 0.61  CALCIUM 9.0   < > 8.4*  PROT 6.3  --   --   BILITOT 0.7  --   --   ALT 12  --   --   AST 20  --   --   GLUCOSE 89   < > 152*   < > = values in this interval not displayed.   Lab Results  Component Value Date   CHOL 129 09/08/2017   HDL 66 09/08/2017   LDLCALC 82 03/08/2017   TRIG 74 09/08/2017   No results found for: DDIMER  Radiology/Studies:  Dg Chest 2 View  Result Date: 09/10/2017 CLINICAL DATA:  Chest pain EXAM: CHEST  2 VIEW COMPARISON:  04/08/2016, 07/24/2014 FINDINGS: Mildly low lung volumes. Streaky bibasilar atelectasis. Bronchitic changes at the bases. No focal consolidation or pleural effusion cardiomegaly with aortic atherosclerosis. No pneumothorax. Small hiatal hernia. IMPRESSION: Bronchitic changes at the bases with streaky atelectasis. Cardiomegaly. Electronically Signed   By: Jasmine PangKim  Fujinaga M.D.   On: 09/10/2017 21:55    Wt Readings from Last 3 Encounters:  09/11/17 86.9 kg (191 lb 8 oz)  09/08/17 87 kg (191 lb 12.8 oz)  05/15/17 83.9 kg (185 lb)    EKG: afib with rapid vr.   Physical Exam:  Blood pressure (!) 136/96, pulse (!) 102, temperature (!) 97.5 F (36.4 C), temperature source Oral, resp. rate (!) 22, height 5\' 8"  (1.727 m), weight 86.9 kg (191 lb 8 oz), SpO2 99 %. Body mass index is 29.12 kg/m. General: Well developed, well nourished, in no acute distress. Head: Normocephalic, atraumatic, sclera non-icteric, no xanthomas, nares are without  discharge.  Neck: Negative for carotid bruits. JVD not elevated. Lungs: Clear bilaterally to auscultation without wheezes, rales, or rhonchi. Breathing is unlabored. Heart: irr, irr. No murmurs, rubs, or gallops appreciated. Abdomen: Soft, non-tender, non-distended with normoactive  bowel sounds. No hepatomegaly. No rebound/guarding. No obvious abdominal masses. Msk:  Strength and tone appear normal for age. Extremities: No clubbing or cyanosis. No edema.  Distal pedal pulses are 2+ and equal bilaterally. Neuro: Alert and oriented X 3. No facial asymmetry. No focal deficit. Moves all extremities spontaneously. Psych:  Responds to questions appropriately with a normal affect.     Assessment and Plan  84 yo male with history of afib admitted with chest pain and rvr. He is treated with metoprolol succinate 50 mg daily as outpatient and has not been felt to be a anticoaulation candidate due to age and bleeding risk.  AFIB-continue with metoprolol xl 50 mg daily. Follow rate response. Not candidate for chronic anticoagulation. Continue increasing beta blockers as needed.  Elevated troponin-does not appear to be due to ACS. Continue with asa, metoprolol. Not candidate for cath. This appears to be demand ischemia.   Sob-careful diuresis. Follow renal funciton.   Caren Macadam MD 09/11/2017, 4:00 PM Pager: [phone]

## 2017-09-12 ENCOUNTER — Observation Stay: Payer: Medicare Other

## 2017-09-12 DIAGNOSIS — Z825 Family history of asthma and other chronic lower respiratory diseases: Secondary | ICD-10-CM | POA: Diagnosis not present

## 2017-09-12 DIAGNOSIS — R531 Weakness: Secondary | ICD-10-CM | POA: Diagnosis not present

## 2017-09-12 DIAGNOSIS — E871 Hypo-osmolality and hyponatremia: Secondary | ICD-10-CM | POA: Diagnosis not present

## 2017-09-12 DIAGNOSIS — I509 Heart failure, unspecified: Secondary | ICD-10-CM | POA: Diagnosis not present

## 2017-09-12 DIAGNOSIS — I4891 Unspecified atrial fibrillation: Secondary | ICD-10-CM | POA: Diagnosis not present

## 2017-09-12 DIAGNOSIS — I5021 Acute systolic (congestive) heart failure: Secondary | ICD-10-CM | POA: Diagnosis present

## 2017-09-12 DIAGNOSIS — I5023 Acute on chronic systolic (congestive) heart failure: Secondary | ICD-10-CM | POA: Diagnosis not present

## 2017-09-12 DIAGNOSIS — Z800 Family history of malignant neoplasm of digestive organs: Secondary | ICD-10-CM | POA: Diagnosis not present

## 2017-09-12 DIAGNOSIS — R0602 Shortness of breath: Secondary | ICD-10-CM | POA: Diagnosis not present

## 2017-09-12 DIAGNOSIS — I110 Hypertensive heart disease with heart failure: Secondary | ICD-10-CM | POA: Diagnosis present

## 2017-09-12 DIAGNOSIS — E222 Syndrome of inappropriate secretion of antidiuretic hormone: Secondary | ICD-10-CM | POA: Diagnosis present

## 2017-09-12 DIAGNOSIS — E039 Hypothyroidism, unspecified: Secondary | ICD-10-CM | POA: Diagnosis present

## 2017-09-12 DIAGNOSIS — I2510 Atherosclerotic heart disease of native coronary artery without angina pectoris: Secondary | ICD-10-CM | POA: Diagnosis present

## 2017-09-12 DIAGNOSIS — J9811 Atelectasis: Secondary | ICD-10-CM | POA: Diagnosis present

## 2017-09-12 DIAGNOSIS — Z96649 Presence of unspecified artificial hip joint: Secondary | ICD-10-CM | POA: Diagnosis present

## 2017-09-12 DIAGNOSIS — Z79890 Hormone replacement therapy (postmenopausal): Secondary | ICD-10-CM | POA: Diagnosis not present

## 2017-09-12 DIAGNOSIS — H9113 Presbycusis, bilateral: Secondary | ICD-10-CM | POA: Diagnosis present

## 2017-09-12 DIAGNOSIS — E785 Hyperlipidemia, unspecified: Secondary | ICD-10-CM | POA: Diagnosis present

## 2017-09-12 DIAGNOSIS — I482 Chronic atrial fibrillation: Secondary | ICD-10-CM | POA: Diagnosis not present

## 2017-09-12 DIAGNOSIS — Z79899 Other long term (current) drug therapy: Secondary | ICD-10-CM | POA: Diagnosis not present

## 2017-09-12 DIAGNOSIS — Z87442 Personal history of urinary calculi: Secondary | ICD-10-CM | POA: Diagnosis not present

## 2017-09-12 DIAGNOSIS — D649 Anemia, unspecified: Secondary | ICD-10-CM | POA: Diagnosis present

## 2017-09-12 DIAGNOSIS — Z7982 Long term (current) use of aspirin: Secondary | ICD-10-CM | POA: Diagnosis not present

## 2017-09-12 DIAGNOSIS — Z66 Do not resuscitate: Secondary | ICD-10-CM | POA: Diagnosis not present

## 2017-09-12 DIAGNOSIS — Z96698 Presence of other orthopedic joint implants: Secondary | ICD-10-CM | POA: Diagnosis present

## 2017-09-12 DIAGNOSIS — Z515 Encounter for palliative care: Secondary | ICD-10-CM | POA: Diagnosis not present

## 2017-09-12 LAB — BASIC METABOLIC PANEL
ANION GAP: 13 (ref 5–15)
BUN: 15 mg/dL (ref 6–20)
CHLORIDE: 84 mmol/L — AB (ref 101–111)
CO2: 27 mmol/L (ref 22–32)
Calcium: 8.5 mg/dL — ABNORMAL LOW (ref 8.9–10.3)
Creatinine, Ser: 0.63 mg/dL (ref 0.61–1.24)
GFR calc non Af Amer: >60 mL/min (ref >60)
Glucose, Bld: 114 mg/dL — ABNORMAL HIGH (ref 65–99)
POTASSIUM: 3.9 mmol/L (ref 3.5–5.1)
SODIUM: 124 mmol/L — AB (ref 135–145)

## 2017-09-12 MED ORDER — FUROSEMIDE 10 MG/ML IJ SOLN
20.0000 mg | Freq: Once | INTRAMUSCULAR | Status: AC
  Administered 2017-09-12: 20 mg via INTRAVENOUS
  Filled 2017-09-12 (×3): qty 2

## 2017-09-12 NOTE — Consult Note (Signed)
[facility] CONSULT NOTE    Date: 09/12/2017                  Patient Name:  Kirk Sanchez  MRN: [id number]  DOB: 12-13-1924  Age / Sex: 84 y.o., male         PCP: Kerman Passey, MD                 Service Requesting Consult: Hospitalist                 Reason for Consult: Hyponatremia.            History of Present Illness: Patient is a 84 y.o. male with a PMHx of congestive heart failure ejection fraction 40-45%, atrial fibrillation, hypertension, hyperlipidemia, and hypothyroidism who was admitted to [facility] on 09/10/2017 for evaluation of chest pain and shortness of breath.  Patient states that over the past several weeks she has had progressive shortness of breath.  On the day of admission he also developed chest pain.  Upon presentation here he was found to be in atrial fibrillation with rapid ventricular response.  He was given some diltiazem initially with good control of his heart rate.  At the same time he was noted is being hyponatremic.  His serum sodium at the moment appears to be low at 124.  He does appear to have congestive heart failure with an ejection fraction of 40-45%.  He has rales on exam today as well.   Medications: Outpatient medications: Medications Prior to Admission  Medication Sig Dispense Refill Last Dose  . aspirin 81 MG tablet Take 81 mg by mouth daily.   Taking  . Calcium Carb-Cholecalciferol (CALCIUM-VITAMIN D) 500-200 MG-UNIT tablet Take 1 tablet by mouth 2 (two) times daily.    Taking  . levothyroxine (SYNTHROID, LEVOTHROID) 25 MCG tablet TAKE 1 TABLET BY MOUTH ONCE DAILY ON AN EMPTY STOMACH. WAIT 30 MINUTES BEFORE TAKING OTHER MEDS. 30 tablet 3 Taking  . magnesium oxide (MAG-OX) 400 MG tablet Take 400 mg by mouth daily.    Taking  . metoprolol succinate (TOPROL-XL) 50 MG 24 hr tablet Take 50 mg by mouth daily.   Taking  . ofloxacin (OCUFLOX) 0.3 % ophthalmic solution Use 1 drop three times a day to the surgical eye for one  week. Start after surgery   Taking  . Omega-3 Fatty Acids (FISH OIL PO) Take by mouth daily.    Taking  . prednisoLONE acetate (PRED FORTE) 1 % ophthalmic suspension    Taking  . ranitidine (ZANTAC) 150 MG tablet Take 1 tablet (150 mg total) by mouth 2 (two) times daily. If needed for heartburn 60 tablet 5 Taking  . simvastatin (ZOCOR) 40 MG tablet One by mouth every evening for cholesterol 90 tablet 1 Taking    Current medications: Current Facility-Administered Medications  Medication Dose Route Frequency Provider Last Rate Last Dose  . acetaminophen (TYLENOL) tablet 650 mg  650 mg Oral Q6H PRN Oralia Manis, MD       Or  . acetaminophen (TYLENOL) suppository 650 mg  650 mg Rectal Q6H PRN Oralia Manis, MD      . aspirin chewable tablet 81 mg  81 mg Oral Daily Oralia Manis, MD   81 mg at 09/12/17 1023  . azithromycin (ZITHROMAX) tablet 500 mg  500 mg Oral Daily Oralia Manis, MD   500 mg at 09/12/17 1023  . enoxaparin (LOVENOX) injection 40 mg  40 mg Subcutaneous Q24H Willis,  Onalee Hua, MD   40 mg at 09/11/17 2037  . famotidine (PEPCID) tablet 20 mg  20 mg Oral BID Oralia Manis, MD   20 mg at 09/12/17 1023  . levothyroxine (SYNTHROID, LEVOTHROID) tablet 25 mcg  25 mcg Oral QAC breakfast Oralia Manis, MD   25 mcg at 09/12/17 1023  . metoprolol succinate (TOPROL-XL) 24 hr tablet 50 mg  50 mg Oral Daily Oralia Manis, MD   50 mg at 09/12/17 1023  . ondansetron (ZOFRAN) tablet 4 mg  4 mg Oral Q6H PRN Oralia Manis, MD       Or  . ondansetron [facility]) injection 4 mg  4 mg Intravenous Q6H PRN Oralia Manis, MD      . simvastatin (ZOCOR) tablet 40 mg  40 mg Oral q1800 Oralia Manis, MD   40 mg at 09/11/17 1707      Allergies: No Known Allergies    Past Medical History: Past Medical History:  Diagnosis Date  . Atrial fibrillation (HCC)   . Hyperlipidemia   . Hypertension   . Kidney stone   . Presbycusis of both ears 03/06/2016  . Thyroid disease      Past Surgical History: Past  Surgical History:  Procedure Laterality Date  . HAND ARTHROPLASTY    . HIP ARTHROPLASTY    . STENT PLACEMENT RT URETER ([facility] HX)       Family History: Family History  Problem Relation Age of Onset  . Cancer Mother        stomach  . Asthma Father   . Stroke Neg Hx   . Heart disease Neg Hx      Social History: Social History   Socioeconomic History  . Marital status: Widowed    Spouse name: Not on file  . Number of children: Not on file  . Years of education: Not on file  . Highest education level: Not on file  Social Needs  . Financial resource strain: Not on file  . Food insecurity - worry: Not on file  . Food insecurity - inability: Not on file  . Transportation needs - medical: Not on file  . Transportation needs - non-medical: Not on file  Occupational History  . Not on file  Tobacco Use  . Smoking status: Never Smoker  . Smokeless tobacco: Never Used  Substance and Sexual Activity  . Alcohol use: No    Alcohol/week: 0.0 oz  . Drug use: No  . Sexual activity: No  Other Topics Concern  . Not on file  Social History Narrative  . Not on file     Review of Systems: Review of Systems  Constitutional: Positive for malaise/fatigue. Negative for chills and fever.  HENT: Positive for hearing loss. Negative for congestion and nosebleeds.   Eyes: Negative for blurred vision and double vision.  Respiratory: Positive for cough and shortness of breath.   Cardiovascular: Positive for chest pain, orthopnea and PND.  Gastrointestinal: Negative for heartburn, nausea and vomiting.  Genitourinary: Negative for dysuria, frequency and urgency.  Musculoskeletal: Negative for back pain and myalgias.  Skin: Negative for itching and rash.  Neurological: Positive for weakness. Negative for dizziness and focal weakness.  Endo/Heme/Allergies: Negative for polydipsia. Does not bruise/bleed easily.  Psychiatric/Behavioral: Negative for depression and suicidal ideas. The patient is  not nervous/anxious.      Vital Signs: Blood pressure (!) 130/93, pulse 98, temperature 97.8 F (36.6 C), temperature source Oral, resp. rate 18, height 5\' 8"  (1.727 m), weight 86.9 kg (191 lb  8 oz), SpO2 95 %.  Weight trends: Filed Weights   09/10/17 2001 09/11/17 0057 09/11/17 0608  Weight: 86.6 kg (191 lb) 87.2 kg (192 lb 4.8 oz) 86.9 kg (191 lb 8 oz)    Physical Exam: General: NAD, resting in bed  Head: Normocephalic, atraumatic.  Eyes: Anicteric, EOMI  Nose: Mucous membranes moist, not inflammed, nonerythematous.  Throat: Oropharynx nonerythematous, no exudate appreciated.   Neck: Supple, trachea midline.  Lungs:  Normal respiratory effort. Bibasilar rales  Heart: RRR. S1 and S2 normal without gallop, murmur, or rubs.  Abdomen:  BS normoactive. Soft, Nondistended, non-tender.  No masses or organomegaly.  Extremities: trace pretibial edema.  Neurologic: A&O X3, Motor strength is 5/5 in the all 4 extremities  Skin: No visible rashes, scars.    Lab results: Basic Metabolic Panel: Recent Labs  Lab 09/10/17 2035 09/11/17 0229 09/12/17 0321  NA 124* 125* 124*  K 4.3 4.2 3.9  CL 87* 87* 84*  CO2 24 26 27   GLUCOSE 129* 152* 114*  BUN 14 12 15   CREATININE 0.62 0.61 0.63  CALCIUM 8.6* 8.4* 8.5*    Liver Function Tests: Recent Labs  Lab 09/08/17 1054  AST 20  ALT 12  BILITOT 0.7  PROT 6.3   No results for input(s): LIPASE, AMYLASE in the last 168 hours. No results for input(s): AMMONIA in the last 168 hours.  CBC: Recent Labs  Lab 09/08/17 1054 09/10/17 2035 09/11/17 0229  WBC 4.8 8.1 8.0  NEUTROABS 3,370  --   --   HGB 11.5* 12.5* 11.7*  HCT 34.9* 37.6* 35.3*  MCV 87.3 87.2 87.5  PLT 152 169 152    Cardiac Enzymes: Recent Labs  Lab 09/10/17 2035 09/11/17 0229 09/11/17 0758 09/11/17 1436  TROPONINI 0.05* 0.04* 0.07* 0.08*    BNP: Invalid input(s): POCBNP  CBG: No results for input(s): GLUCAP in the last 168 hours.  Microbiology: No  results found for this or any previous visit.  Coagulation Studies: No results for input(s): LABPROT, INR in the last 72 hours.  Urinalysis: Recent Labs    09/10/17 2157  COLORURINE YELLOW*  LABSPEC 1.019  PHURINE 5.0  GLUCOSEU NEGATIVE  HGBUR MODERATE*  BILIRUBINUR NEGATIVE  KETONESUR 5*  PROTEINUR 100*  NITRITE NEGATIVE  LEUKOCYTESUR NEGATIVE      Imaging: Dg Chest 2 View  Result Date: 09/12/2017 CLINICAL DATA:  Shortness of breath EXAM: CHEST  2 VIEW COMPARISON:  04/08/2016 FINDINGS: There is mild bilateral interstitial prominence. There is no focal parenchymal opacity. There is no pleural effusion or pneumothorax. There is mild stable cardiomegaly. There is thoracic aortic atherosclerosis. 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 IMPRESSION: Cardiomegaly with mild pulmonary vascular congestion. Electronically Signed   By: Elige KoHetal  Patel   On: 09/12/2017 09:54   Dg Chest 2 View  Result Date: 09/10/2017 CLINICAL DATA:  Chest pain EXAM: CHEST  2 VIEW COMPARISON:  04/08/2016, 07/24/2014 FINDINGS: Mildly low lung volumes. Streaky bibasilar atelectasis. Bronchitic changes at the bases. No focal consolidation or pleural effusion cardiomegaly with aortic atherosclerosis. No pneumothorax. Small hiatal hernia. IMPRESSION: Bronchitic changes at the bases with streaky atelectasis. Cardiomegaly. Electronically Signed   By: Jasmine PangKim  Fujinaga M.D.   On: 09/10/2017 21:55      Assessment & Plan: Pt is a 84 y.o. male with a PMHx of congestive heart failure ejection fraction 40-45%, atrial fibrillation, hypertension, hyperlipidemia, and hypothyroidism who was [facility]2017 for evaluation of chest pain and shortness of breath.  1.  Hyponatremia, suspect due to volume overload vs SIADH. 2.  Acute systolic heart failure, EF 40-45%. 3.  Anemia NOS.   Plan:  The patient presented with progressive shortness of breath.  He does have bilateral rales.  Suspect that  hyponatremia is related to CHF.  An alternative consideration is SIADH.  We will proceed with additional workup including serum osmolality, urine osmolality, TSH, cortisol, and serum uric acid level.  Okay to continue Lasix for now.  If Lasix does not work well for his hyponatremia we may need to consider giving a dose of tolvaptan.  However we will hold off on this for now.  Continue to monitor serum sodium for now.  Thanks for consultation.

## 2017-09-12 NOTE — Progress Notes (Signed)
[facility] [place] [facility]   PATIENT NAME: Kirk Sanchez    MR#:  [id number]  DATE OF BIRTH:  10/20/1924  SUBJECTIVE:  CHIEF COMPLAINT: Patient denies any chest pain or palpitations today.  Very hard of hearing  REVIEW OF SYSTEMS:  CONSTITUTIONAL: No fever, fatigue or weakness.  EYES: No blurred or double vision.  EARS, NOSE, AND THROAT: No tinnitus or ear pain.  RESPIRATORY: No cough, shortness of breath, wheezing or hemoptysis.  CARDIOVASCULAR: No chest pain, orthopnea, edema.  GASTROINTESTINAL: No nausea, vomiting, diarrhea or abdominal pain.  GENITOURINARY: No dysuria, hematuria.  ENDOCRINE: No polyuria, nocturia,  HEMATOLOGY: No anemia, easy bruising or bleeding SKIN: No rash or lesion. MUSCULOSKELETAL: No joint pain or arthritis.   NEUROLOGIC: No tingling, numbness, weakness.  PSYCHIATRY: No anxiety or depression.   DRUG ALLERGIES:  No Known Allergies  VITALS:  Blood pressure (!) 130/93, pulse 98, temperature 97.8 F (36.6 C), temperature source Oral, resp. rate 18, height 5\' 8"  (1.727 m), weight 86.9 kg (191 lb 8 oz), SpO2 95 %.  PHYSICAL EXAMINATION:  GENERAL:  84 y.o.-year-old patient lying in the bed with no acute distress.  EYES: Pupils equal, round, reactive to light and accommodation. No scleral icterus. Extraocular muscles intact.  HEENT: Head atraumatic, normocephalic. Oropharynx and nasopharynx clear.  NECK:  Supple, no jugular venous distention. No thyroid enlargement, no tenderness.  LUNGS: Normal breath sounds bilaterally, no wheezing, positive rales,rhonchi or crepitation. No use of accessory muscles of respiration.  CARDIOVASCULAR: Irregularly regular. No murmurs, rubs, or gallops.  ABDOMEN: Soft, nontender, nondistended. Bowel sounds present. No organomegaly or mass.  EXTREMITIES: No pedal edema, cyanosis, or clubbing.  NEUROLOGIC: Awake and alert oriented x2-3 with intermittent episodes of confusion PSYCHIATRIC: The  patient is alert and oriented x 3.  SKIN: No obvious rash, lesion, or ulcer.    LABORATORY PANEL:   CBC Recent Labs  Lab 09/11/17 0229  WBC 8.0  HGB 11.7*  HCT 35.3*  PLT 152   ------------------------------------------------------------------------------------------------------------------  Chemistries  Recent Labs  Lab 09/08/17 1054  09/12/17 0321  NA 133*   < > 124*  K 4.6   < > 3.9  CL 96*   < > 84*  CO2 26   < > 27  GLUCOSE 89   < > 114*  BUN 15   < > 15  CREATININE 0.73   < > 0.63  CALCIUM 9.0   < > 8.5*  AST 20  --   --   ALT 12  --   --   BILITOT 0.7  --   --    < > = values in this interval not displayed.   ------------------------------------------------------------------------------------------------------------------  Cardiac Enzymes Recent Labs  Lab 09/11/17 1436  TROPONINI 0.08*   ------------------------------------------------------------------------------------------------------------------  RADIOLOGY:  Dg Chest 2 View  Result Date: 09/12/2017 CLINICAL DATA:  Shortness of breath EXAM: CHEST  2 VIEW COMPARISON:  04/08/2016 FINDINGS: There is mild bilateral interstitial prominence. There is no focal parenchymal opacity. There is no pleural effusion or pneumothorax. There is mild stable cardiomegaly. There is thoracic aortic atherosclerosis. 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 IMPRESSION: Cardiomegaly with mild pulmonary vascular congestion. Electronically Signed   By: Elige Ko   On: 09/12/2017 09:54   Dg Chest 2 View  Result Date: 09/10/2017 CLINICAL DATA:  Chest pain EXAM: CHEST  2 VIEW COMPARISON:  04/08/2016, 07/24/2014 FINDINGS: Mildly low lung volumes. Streaky bibasilar atelectasis. Bronchitic changes at the bases. No  focal consolidation or pleural effusion cardiomegaly with aortic atherosclerosis. No pneumothorax. Small hiatal hernia. IMPRESSION: Bronchitic changes at the bases with streaky atelectasis.  Cardiomegaly. Electronically Signed   By: Jasmine PangKim  Fujinaga M.D.   On: 09/10/2017 21:55    EKG:   Orders placed or performed during the hospital encounter of 09/10/17  . EKG 12-Lead  . EKG 12-Lead  . ED EKG within 10 minutes  . ED EKG within 10 minutes    ASSESSMENT AND PLAN:    # Atrial fibrillation with RVR (HCC) - rate is better with IV diltiazem dose. continue his home dose of Lopressor echocardiogram -has revealed 40-45% of ejection fraction [facility] cardiology following Troponins 0.05-0.04-0.07-0.08, no significant trend  #Acute systolic CHF Lasix, beta-blocker and statin Blood pressure is low normal will hold off on the ACE inhibitor at this time     # Hyponatremia -IV saline initially to bring his sodium up.   He was found to be at 124-125, not significantly improving   nephrology consult placed Patient's baseline seems to be at 130s    # Arteriosclerosis of coronary artery -continue home meds, other workup as above    # BP (high blood pressure) -continue home medications    # HLD (hyperlipidemia) -statin    # Hypothyroidism -home dose thyroid replacement      All the records are reviewed and case discussed with Care Management/Social Workerr. Management plans discussed with the patient, family and they are in agreement.  CODE STATUS: fc   TOTAL TIME TAKING CARE OF THIS PATIENT: 36 minutes.   POSSIBLE D/C IN 1- 2  DAYS, DEPENDING ON CLINICAL CONDITION.  Note: This dictation was prepared with Dragon dictation along with smaller phrase technology. Any transcriptional errors that result from this process are unintentional.   Ramonita LabAruna Laurynn Mccorvey M.D on 09/12/2017 at 2:17 PM  Between 7am to 6pm - Pager - [phone] After 6pm go to [email or web address] - password [email or web address] [facility]  [facility]  Office  [phone]  CC: Primary care physician; Kerman PasseyLada, Melinda P, MD

## 2017-09-13 LAB — BASIC METABOLIC PANEL
ANION GAP: 13 (ref 5–15)
Anion gap: 13 (ref 5–15)
BUN: 20 mg/dL (ref 6–20)
BUN: 21 mg/dL — AB (ref 6–20)
CALCIUM: 8.5 mg/dL — AB (ref 8.9–10.3)
CHLORIDE: 79 mmol/L — AB (ref 101–111)
CO2: 24 mmol/L (ref 22–32)
CO2: 25 mmol/L (ref 22–32)
CREATININE: 0.67 mg/dL (ref 0.61–1.24)
Calcium: 8.5 mg/dL — ABNORMAL LOW (ref 8.9–10.3)
Chloride: 81 mmol/L — ABNORMAL LOW (ref 101–111)
Creatinine, Ser: 0.69 mg/dL (ref 0.61–1.24)
GFR calc Af Amer: >60 mL/min (ref >60)
GFR calc Af Amer: >60 mL/min (ref >60)
GFR calc non Af Amer: >60 mL/min (ref >60)
GFR calc non Af Amer: >60 mL/min (ref >60)
GLUCOSE: 107 mg/dL — AB (ref 65–99)
GLUCOSE: 120 mg/dL — AB (ref 65–99)
POTASSIUM: 4.3 mmol/L (ref 3.5–5.1)
Potassium: 4.5 mmol/L (ref 3.5–5.1)
Sodium: 118 mmol/L — CL (ref 135–145)
Sodium: 117 mmol/L — CL (ref 135–145)

## 2017-09-13 LAB — CORTISOL: Cortisol, Plasma: 47.9 ug/dL

## 2017-09-13 LAB — SODIUM: Sodium: 117 mmol/L — CL (ref 135–145)

## 2017-09-13 LAB — TSH: TSH: 2.322 u[IU]/mL (ref 0.350–4.500)

## 2017-09-13 LAB — URIC ACID: URIC ACID, SERUM: 5.4 mg/dL (ref 4.4–7.6)

## 2017-09-13 MED ORDER — FUROSEMIDE 10 MG/ML IJ SOLN: 20.0000 mg | Freq: Two times a day (BID) | INTRAMUSCULAR | Status: DC

## 2017-09-13 MED ORDER — TOLVAPTAN 15 MG PO TABS
15.0000 mg | ORAL_TABLET | ORAL | Status: DC
  Administered 2017-09-13: 15 mg via ORAL
  Filled 2017-09-13 (×2): qty 1

## 2017-09-13 NOTE — Progress Notes (Signed)
Physical Therapy Evaluation Patient Details Name: Kirk Sanchez H Ugarte MRN: [id number] DOB: Dec 22, 1924 Today's Date: 09/13/2017   History of Present Illness  Kirk Sanchez  is a 84 y.o. male who presents with chest pain, malaise, generalized weakness.  Patient states he has been feeling bad for the past couple of days, he started developing some palpitations and significant chest pain.  He does have a history of atrial fibrillation, and presented to the ED today in A. fib with RVR.  His rate was moderately controlled initially with some IV diltiazem.  He had a positive troponin, and he was found to be mildly hyponatremic.  Hospitalist were called for admission. Pt now admitted for acute systolic CHF, hyponatremia, and afib with RVR  Clinical Impression  Pt admitted with above diagnosis. Pt currently with functional limitations due to the deficits listed below (see PT Problem List). Upon PT arrival pt is walking independently back from bathroom without assistive device but holding onto walls/bed. He is modified independent with bed mobility and demonstrates safe hand placement with transfers. No LOB with transfers. Pt able to stabilize with UE support on rolling walker once in standing. He is able to complete a full lap around RN station with therapist. Pt reports mild to moderate DOE however unable to obtain SaO2 readings on either extremity. HR increases to around 113 bpm with ambulation. He is steady in standing with UE support on rolling walker but does demonstrate some veering with walker. Recommend use of rolling walker at home after discharge. Pt also would benefit from a BSC as he has a three level home and has to be able to access all levels however there is not a bathroom on every level. It would also serve him well as a shower seat. Recommend HH PT and support from family for IADLs. Pt will benefit from PT services to address deficits in strength, balance, and mobility in order to return to full function  at home.      Follow Up Recommendations Home health PT;Supervision - Intermittent    Equipment Recommendations  Rolling walker with 5" wheels;3in1 (PT)    Recommendations for Other Services       Precautions / Restrictions Precautions Precautions: Fall Restrictions Weight Bearing Restrictions: No      Mobility  Bed Mobility Overal bed mobility: Modified Independent             General bed mobility comments: Pt able to move from sit to supine without external assist from therapist. HOB elevated and use of bed rails. Pt up returning from bathroom when PT arrives for evaluation  Transfers Overall transfer level: Needs assistance Equipment used: Rolling walker (2 wheeled) Transfers: Sit to/from Stand Sit to Stand: Min guard         General transfer comment: Pt demonstrates safe hand placement. No LOB with transfers. Pt able to stabilize with UE support on rolling walker  Ambulation/Gait Ambulation/Gait assistance: Min guard Ambulation Distance (Feet): 200 Feet Assistive device: Rolling walker (2 wheeled)   Gait velocity: Decreased Gait velocity interpretation: <1.8 ft/sec, indicative of risk for recurrent falls General Gait Details: Pt able to complete a full lap around RN station with therapist. He reports mild to moderate DOE however unable to obtaine SaO2 reading on either extremity. HR increases to around 113 bpm with ambulation. He is steady in standing with UE support on rolling walker but does demonstrate some veering with walker  Careers information officertairs            Wheelchair Mobility  Modified Rankin (Stroke Patients Only)       Balance Overall balance assessment: Needs assistance Sitting-balance support: No upper extremity supported Sitting balance-Leahy Scale: Good     Standing balance support: No upper extremity supported Standing balance-Leahy Scale: Fair Standing balance comment: Requires UE support to place feet together. Able to maintain balance in  wide and narrow stance withotu UE support. Positive Rhomberg. Unable to attempt single leg balance due to instability                             Pertinent Vitals/Pain Pain Assessment: No/denies pain    Home Living Family/patient expects to be discharged to:: Private residence Living Arrangements: Alone Available Help at Discharge: Family Type of Home: House Home Access: Stairs to enter Entrance Stairs-Rails: Right Entrance Stairs-Number of Steps: 2 Home Layout: Multi-level Home Equipment: Cane - single point;Other (comment)(lift recliner, no walker)      Prior Function Level of Independence: Needs assistance   Gait / Transfers Assistance Needed: Ambulates with single point cane. No falls  ADL's / Homemaking Assistance Needed: Independent with ADLs, assist for IADLs        Hand Dominance   Dominant Hand: Right    Extremity/Trunk Assessment   Upper Extremity Assessment Upper Extremity Assessment: Generalized weakness    Lower Extremity Assessment Lower Extremity Assessment: Generalized weakness       Communication   Communication: HOH  Cognition Arousal/Alertness: Awake/alert Behavior During Therapy: WFL for tasks assessed/performed Overall Cognitive Status: Within Functional Limits for tasks assessed                                        General Comments      Exercises     Assessment/Plan    PT Assessment Patient needs continued PT services  PT Problem List Decreased strength;Decreased activity tolerance;Decreased balance;Decreased mobility;Decreased knowledge of use of DME;Cardiopulmonary status limiting activity       PT Treatment Interventions DME instruction;Stair training;Gait training;Functional mobility training;Therapeutic activities;Therapeutic exercise;Balance training;Neuromuscular re-education;Patient/family education    PT Goals (Current goals can be found in the Care Plan section)  Acute Rehab PT  Goals Patient Stated Goal: Return to prior function at home PT Goal Formulation: With patient Time For Goal Achievement: 09/27/17 Potential to Achieve Goals: Good    Frequency Min 2X/week   Barriers to discharge Inaccessible home environment Trilevel home. Pt has to access all three levels    Co-evaluation               AM-PAC PT "6 Clicks" Daily Activity  Outcome Measure Difficulty turning over in bed (including adjusting bedclothes, sheets and blankets)?: A Little Difficulty moving from lying on back to sitting on the side of the bed? : A Little Difficulty sitting down on and standing up from a chair with arms (e.g., wheelchair, bedside commode, etc,.)?: A Little Help needed moving to and from a bed to chair (including a wheelchair)?: A Little Help needed walking in hospital room?: A Little Help needed climbing 3-5 steps with a railing? : A Little 6 Click Score: 18    End of Session Equipment Utilized During Treatment: Gait belt Activity Tolerance: Patient tolerated treatment well Patient left: in bed;with call bell/phone within reach;with bed alarm set;with family/visitor present   PT Visit Diagnosis: Unsteadiness on feet (R26.81);Muscle weakness (generalized) (M62.81);Difficulty in walking, not elsewhere classified (  R26.2)    Time: 1030-1056 PT Time Calculation (min) (ACUTE ONLY): 26 min   Charges:   PT Evaluation $PT Eval Low Complexity: 1 Low PT Treatments $Gait Training: 8-22 mins   PT G Codes:        Sharalyn Ink Mayleen Borrero PT, DPT    Aracelie Addis 09/13/2017, 2:13 PM

## 2017-09-13 NOTE — Progress Notes (Signed)
Patient Name: Kirk Sanchez Date of Encounter: 09/13/2017  Hospital Problem List     Principal Problem:   Atrial fibrillation with RVR (HCC) Active Problems:   Arteriosclerosis of coronary artery   HLD (hyperlipidemia)   BP (high blood pressure)   Hypothyroidism   Hyponatremia    Patient Profile     84 yo male admitted with afib with rvr and chf.   Subjective   Still sob  Inpatient Medications    . aspirin  81 mg Oral Daily  . azithromycin  500 mg Oral Daily  . enoxaparin (LOVENOX) injection  40 mg Subcutaneous Q24H  . famotidine  20 mg Oral BID  . furosemide  20 mg Intravenous Q12H  . levothyroxine  25 mcg Oral QAC breakfast  . metoprolol succinate  50 mg Oral Daily  . simvastatin  40 mg Oral q1800    Vital Signs    Vitals:   09/12/17 0834 09/12/17 1927 09/13/17 0343 09/13/17 0836  BP: (!) 130/93 101/61 105/66 (!) 144/88  Pulse: 98 87 72 77  Resp: 18 18 18    Temp: 97.8 F (36.6 C) 98.4 F (36.9 C) 97.9 F (36.6 C) 98 F (36.7 C)  TempSrc: Oral   Oral  SpO2: 95% 91% 90% 97%  Weight:      Height:        Intake/Output Summary (Last 24 hours) at 09/13/2017 0908 Last data filed at 09/13/2017 0300 Gross per 24 hour  Intake 480 ml  Output 0 ml  Net 480 ml   Filed Weights   09/10/17 2001 09/11/17 0057 09/11/17 0608  Weight: 86.6 kg (191 lb) 87.2 kg (192 lb 4.8 oz) 86.9 kg (191 lb 8 oz)    Physical Exam    GEN: Well nourished, well developed, in no acute distress.  HEENT: normal.  Neck: Supple, no JVD, carotid bruits, or masses. Cardiac: RRR, no murmurs, rubs, or gallops. No clubbing, cyanosis, edema.  Radials/DP/PT 2+ and equal bilaterally.  Respiratory:  Respirations regular and unlabored, clear to auscultation bilaterally. GI: Soft, nontender, nondistended, BS + x 4. MS: no deformity or atrophy. Skin: warm and dry, no rash. Neuro:  Strength and sensation are intact. Psych: Normal affect.  Labs    CBC Recent Labs    09/10/17 2035  09/11/17 0229  WBC 8.1 8.0  HGB 12.5* 11.7*  HCT 37.6* 35.3*  MCV 87.2 87.5  PLT 169 152   Basic Metabolic Panel Recent Labs    16/10/96 0321 09/13/17 0526  NA 124* 118*  K 3.9 4.5  CL 84* 81*  CO2 27 24  GLUCOSE 114* 107*  BUN 15 20  CREATININE 0.63 0.67  CALCIUM 8.5* 8.5*   Liver Function Tests No results for input(s): AST, ALT, ALKPHOS, BILITOT, PROT, ALBUMIN in the last 72 hours. No results for input(s): LIPASE, AMYLASE in the last 72 hours. Cardiac Enzymes Recent Labs    09/11/17 0229 09/11/17 0758 09/11/17 1436  TROPONINI 0.04* 0.07* 0.08*   BNP No results for input(s): BNP in the last 72 hours. D-Dimer No results for input(s): DDIMER in the last 72 hours. Hemoglobin A1C No results for input(s): HGBA1C in the last 72 hours. Fasting Lipid Panel No results for input(s): CHOL, HDL, LDLCALC, TRIG, CHOLHDL, LDLDIRECT in the last 72 hours. Thyroid Function Tests No results for input(s): TSH, T4TOTAL, T3FREE, THYROIDAB in the last 72 hours.  Invalid input(s): FREET3  Telemetry    afib with controlled vr  ECG    afib  with rvr  Radiology    Dg Chest 2 View  Result Date: 09/12/2017 CLINICAL DATA:  Shortness of breath EXAM: CHEST  2 VIEW COMPARISON:  04/08/2016 FINDINGS: There is mild bilateral interstitial prominence. There is no focal parenchymal opacity. There is no pleural effusion or pneumothorax. There is mild stable cardiomegaly. There is thoracic aortic atherosclerosis. 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 IMPRESSION: Cardiomegaly with mild pulmonary vascular congestion. Electronically Signed   By: Elige KoHetal  Patel   On: 09/12/2017 09:54   Dg Chest 2 View  Result Date: 09/10/2017 CLINICAL DATA:  Chest pain EXAM: CHEST  2 VIEW COMPARISON:  04/08/2016, 07/24/2014 FINDINGS: Mildly low lung volumes. Streaky bibasilar atelectasis. Bronchitic changes at the bases. No focal consolidation or pleural effusion cardiomegaly with aortic  atherosclerosis. No pneumothorax. Small hiatal hernia. IMPRESSION: Bronchitic changes at the bases with streaky atelectasis. Cardiomegaly. Electronically Signed   By: Jasmine PangKim  Fujinaga M.D.   On: 09/10/2017 21:55    Assessment & Plan    CHF-still with sob and evidence of volume overload. EF 40-45%. Will continue to diurese and follow clinical exam, renal function and cxr.   afib-rate appears better controlled at present. Will continue with metoprolol succ 50 mg daily, and defer anticoagulation for now due to bleeding risk.  Signed, Darlin PriestlyKenneth A. Christmas Faraci MD 09/13/2017, 9:08 AM  Pager: [phone]

## 2017-09-13 NOTE — Progress Notes (Signed)
[facility] [place] [facility]   PATIENT NAME: Kirk Sanchez    MR#:  [id number]  DATE OF BIRTH:  07-02-1925  SUBJECTIVE:  CHIEF COMPLAINT: Patient denies any chest pain or palpitations today.  Very hard of hearing.  Out of bed to chair  REVIEW OF SYSTEMS:  CONSTITUTIONAL: No fever, fatigue or weakness.  EYES: No blurred or double vision.  EARS, NOSE, AND THROAT: No tinnitus or ear pain.  RESPIRATORY: No cough, shortness of breath, wheezing or hemoptysis.  CARDIOVASCULAR: No chest pain, orthopnea, edema.  GASTROINTESTINAL: No nausea, vomiting, diarrhea or abdominal pain.  GENITOURINARY: No dysuria, hematuria.  ENDOCRINE: No polyuria, nocturia,  HEMATOLOGY: No anemia, easy bruising or bleeding SKIN: No rash or lesion. MUSCULOSKELETAL: No joint pain or arthritis.   NEUROLOGIC: No tingling, numbness, weakness.  PSYCHIATRY: No anxiety or depression.   DRUG ALLERGIES:  No Known Allergies  VITALS:  Blood pressure (!) 144/88, pulse 77, temperature 98 F (36.7 C), temperature source Oral, resp. rate 18, height 5\' 8"  (1.727 m), weight 86.9 kg (191 lb 8 oz), SpO2 97 %.  PHYSICAL EXAMINATION:  GENERAL:  84 y.o.-year-old patient lying in the bed with no acute distress.  EYES: Pupils equal, round, reactive to light and accommodation. No scleral icterus. Extraocular muscles intact.  HEENT: Head atraumatic, normocephalic. Oropharynx and nasopharynx clear.  NECK:  Supple, no jugular venous distention. No thyroid enlargement, no tenderness.  LUNGS: rattly breath sounds bilaterally, no wheezing, positive rales,rhonchi or crepitation. No use of accessory muscles of respiration.  CARDIOVASCULAR: Irregularly regular. No murmurs, rubs, or gallops.  ABDOMEN: Soft, nontender, nondistended. Bowel sounds present. No organomegaly or mass.  EXTREMITIES: No pedal edema, cyanosis, or clubbing.  NEUROLOGIC: Awake and alert oriented x2-3 with intermittent episodes of  confusion PSYCHIATRIC: The patient is alert and oriented x 3.  SKIN: No obvious rash, lesion, or ulcer.    LABORATORY PANEL:   CBC Recent Labs  Lab 09/11/17 0229  WBC 8.0  HGB 11.7*  HCT 35.3*  PLT 152   ------------------------------------------------------------------------------------------------------------------  Chemistries  Recent Labs  Lab 09/08/17 1054  09/12/17 0321  NA 133*   < > 124*  K 4.6   < > 3.9  CL 96*   < > 84*  CO2 26   < > 27  GLUCOSE 89   < > 114*  BUN 15   < > 15  CREATININE 0.73   < > 0.63  CALCIUM 9.0   < > 8.5*  AST 20  --   --   ALT 12  --   --   BILITOT 0.7  --   --    < > = values in this interval not displayed.   ------------------------------------------------------------------------------------------------------------------  Cardiac Enzymes Recent Labs  Lab 09/11/17 1436  TROPONINI 0.08*   ------------------------------------------------------------------------------------------------------------------  RADIOLOGY:  Dg Chest 2 View  Result Date: 09/12/2017 CLINICAL DATA:  Shortness of breath EXAM: CHEST  2 VIEW COMPARISON:  04/08/2016 FINDINGS: There is mild bilateral interstitial prominence. There is no focal parenchymal opacity. There is no pleural effusion or pneumothorax. There is mild stable cardiomegaly. There is thoracic aortic atherosclerosis. 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 IMPRESSION: Cardiomegaly with mild pulmonary vascular congestion. Electronically Signed   By: Elige KoHetal  Patel   On: 09/12/2017 09:54    EKG:   Orders placed or performed during the hospital encounter of 09/10/17  . EKG 12-Lead  . EKG 12-Lead  . ED EKG within 10 minutes  .  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 continue his home dose of Lopressor echocardiogram -has revealed 40-45% of ejection fraction [facility] cardiology following Troponins 0.05-0.04-0.07-0.08, no significant trend     # Arteriosclerosis of coronary artery -continue home meds, other workup as above    # BP (high blood pressure) -continue home medications    # HLD (hyperlipidemia) -statin    # Hypothyroidism -home dose thyroid replacement      All the records are reviewed and case discussed with Care Management/Social Workerr. Management plans discussed with the patient, family and they are in agreement.  CODE STATUS: fc   TOTAL TIME TAKING CARE OF THIS PATIENT: 36 minutes.   POSSIBLE D/C IN 1- 2  DAYS, DEPENDING ON CLINICAL CONDITION.  Note: This dictation was prepared with Dragon dictation along with smaller phrase technology. Any transcriptional errors that result from this process are unintentional.   Ramonita Lab M.D on 09/13/2017 at 8:52 AM  Between 7am to 6pm - Pager - [phone] After 6pm go to [email or web address] - password [email or web address]  [facility]  Office  [phone]  CC: Primary care physician; Kerman Passey, MD

## 2017-09-13 NOTE — Progress Notes (Signed)
[facility]  ROUNDING NOTE   Subjective:  Patient still having some shortness of breath. Serum sodium is dropped further to 118. Lasix was stopped earlier today and patient was transitioned to tolvaptan.     Objective:  Vital signs in last 24 hours:  Temp:  [97.9 F (36.6 C)-98.4 F (36.9 C)] 98.1 F (36.7 C) (02/12 1511) Pulse Rate:  [72-87] 77 (02/12 1511) Resp:  [18] 18 (02/12 0343) BP: (101-144)/(61-88) 122/87 (02/12 1511) SpO2:  [90 %-97 %] 96 % (02/12 1511)  Weight change:  Filed Weights   09/10/17 2001 09/11/17 0057 09/11/17 [id number]  Weight: 86.6 kg (191 lb) 87.2 kg (192 lb 4.8 oz) 86.9 kg (191 lb 8 oz)    Intake/Output: I/O last 3 completed shifts: In: 480 [P.O.:480] Out: 150 [Urine:150]   Intake/Output this shift:  Total I/O In: 480 [P.O.:480] Out: -   Physical Exam: General: No acute distress  Head: Normocephalic, atraumatic. Moist oral mucosal membranes  Eyes: Anicteric  Neck: Supple, trachea midline  Lungs:  Basilar rales, normal effort  Heart: S1S2 irregular  Abdomen:  Soft, nontender, bowel sounds present  Extremities: trace peripheral edema.  Neurologic: Awake, alert, following commands  Skin: No lesions       Basic Metabolic Panel: Recent Labs  Lab 09/10/17 2035 09/11/17 0229 09/12/17 0321 09/13/17 0526 09/13/17 0939  NA 124* 125* 124* 118* 117*  K 4.3 4.2 3.9 4.5 4.3  CL 87* 87* 84* 81* 79*  CO2 24 26 27 24 25   GLUCOSE 129* 152* 114* 107* 120*  BUN 14 12 15 20  21*  CREATININE 0.62 0.61 0.63 0.67 0.69  CALCIUM 8.6* 8.4* 8.5* 8.5* 8.5*    Liver Function Tests: Recent Labs  Lab 09/08/17 1054  AST 20  ALT 12  BILITOT 0.7  PROT 6.3   No results for input(s): LIPASE, AMYLASE in the last 168 hours. No results for input(s): AMMONIA in the last 168 hours.  CBC: Recent Labs  Lab 09/08/17 1054 09/10/17 2035 09/11/17 0229  WBC 4.8 8.1 8.0  NEUTROABS 3,370  --   --   HGB 11.5* 12.5* 11.7*  HCT 34.9* 37.6* 35.3*   MCV 87.3 87.2 87.5  PLT 152 169 152    Cardiac Enzymes: Recent Labs  Lab 09/10/17 2035 09/11/17 0229 09/11/17 0758 09/11/17 1436  TROPONINI 0.05* 0.04* 0.07* 0.08*    BNP: Invalid input(s): POCBNP  CBG: No results for input(s): GLUCAP in the last 168 hours.  Microbiology: No results found for this or any previous visit.  Coagulation Studies: No results for input(s): LABPROT, INR in the last 72 hours.  Urinalysis: Recent Labs    09/10/17 2157  COLORURINE YELLOW*  LABSPEC 1.019  PHURINE 5.0  GLUCOSEU NEGATIVE  HGBUR MODERATE*  BILIRUBINUR NEGATIVE  KETONESUR 5*  PROTEINUR 100*  NITRITE NEGATIVE  LEUKOCYTESUR NEGATIVE      Imaging: Dg Chest 2 View  Result Date: 09/12/2017 CLINICAL DATA:  Shortness of breath EXAM: CHEST  2 VIEW COMPARISON:  04/08/2016 FINDINGS: There is mild bilateral interstitial prominence. There is no focal parenchymal opacity. There is no pleural effusion or pneumothorax. There is mild stable cardiomegaly. There is thoracic aortic atherosclerosis. 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 IMPRESSION: Cardiomegaly with mild pulmonary vascular congestion. Electronically Signed   By: Elige KoHetal  Patel   On: 09/12/2017 09:54     Medications:    . aspirin  81 mg Oral Daily  . azithromycin  500 mg Oral Daily  . enoxaparin (  LOVENOX) injection  40 mg Subcutaneous Q24H  . famotidine  20 mg Oral BID  . levothyroxine  25 mcg Oral QAC breakfast  . metoprolol succinate  50 mg Oral Daily  . simvastatin  40 mg Oral q1800  . tolvaptan  15 mg Oral Q24H   acetaminophen **OR** acetaminophen, ondansetron **OR** ondansetron (ZOFRAN) IV  Assessment/ Plan:  84 y.o. male  with a PMHx of congestive heart failure ejection fraction 40-45%, atrial fibrillation, hypertension, hyperlipidemia, and hypothyroidism who was admitted to [facility] on 09/10/2017 for evaluation of chest pain and shortness of breath.  1.  Hyponatremia, suspect due to volume  overload vs SIADH. 2.  Acute systolic heart failure, EF 40-45%. 3.  Anemia NOS.   Plan:  The patient's serum sodium is dropped a bit further today.  Therefore we have discontinued Lasix.  Patient has been started on tolvaptan 15 mg by mouth daily.  We will monitor serum sodium closely.  Overall this is a poor prognostic sign in the setting ofheart failure.  We may need to consider palliative care consultation as well.    LOS: 1 Kirk Sanchez 2/12/20194:14 PM

## 2017-09-14 DIAGNOSIS — Z66 Do not resuscitate: Secondary | ICD-10-CM

## 2017-09-14 DIAGNOSIS — Z515 Encounter for palliative care: Secondary | ICD-10-CM

## 2017-09-14 DIAGNOSIS — E871 Hypo-osmolality and hyponatremia: Secondary | ICD-10-CM

## 2017-09-14 DIAGNOSIS — I509 Heart failure, unspecified: Secondary | ICD-10-CM

## 2017-09-14 LAB — BASIC METABOLIC PANEL
Anion gap: 8 (ref 5–15)
BUN: 21 mg/dL — ABNORMAL HIGH (ref 6–20)
CALCIUM: 8.7 mg/dL — AB (ref 8.9–10.3)
CHLORIDE: 84 mmol/L — AB (ref 101–111)
CO2: 28 mmol/L (ref 22–32)
CREATININE: 0.67 mg/dL (ref 0.61–1.24)
GFR calc non Af Amer: >60 mL/min (ref >60)
Glucose, Bld: 103 mg/dL — ABNORMAL HIGH (ref 65–99)
Potassium: 4.4 mmol/L (ref 3.5–5.1)
Sodium: 120 mmol/L — ABNORMAL LOW (ref 135–145)

## 2017-09-14 LAB — OSMOLALITY, URINE: OSMOLALITY UR: 115 mosm/kg — AB (ref 300–900)

## 2017-09-14 LAB — SODIUM, URINE, RANDOM

## 2017-09-14 LAB — SODIUM: Sodium: 120 mmol/L — ABNORMAL LOW (ref 135–145)

## 2017-09-14 MED ORDER — TOLVAPTAN 15 MG PO TABS
30.0000 mg | ORAL_TABLET | ORAL | Status: DC
  Administered 2017-09-14: 30 mg via ORAL
  Filled 2017-09-14 (×2): qty 2

## 2017-09-14 NOTE — Progress Notes (Signed)
Family Meeting Note  Advance Directive:yes  Today a meeting took place with the Patient, 2 sons  at bedside    The following clinical team members were present during this meeting:MD  The following were discussed:Patient's diagnosis: Worsening hyponatremia, acute on chronic congestive heart failure, atrial fibrillation with RVR, Patient's progosis: Unable to determine and Goals for treatment: DNR, all 3 sons are healthcare power of attorney.  Agreeable with palliative care  Additional follow-up to be provided: Hospitalist, nephrology, palliative care and cardiology  Time spent during discussion:20 minutes  Ramonita LabAruna Tiamarie Furnari, MD

## 2017-09-14 NOTE — Care Management (Signed)
Patient presented with chest pain and found to be in rapid a fib. Patient also found to hgave low sodium.  Nephrology is following and was started on tolvaptan today.  Physical therapy has recommended home health physical therapy.  CM attempted assessment.  Patient is so hard o hearing, unable to answer questions appropriately.  Attempted to call patient's son and unable to leave a voice mail message as voicemail box not set up .  Left CM business card in room.  Attempting to determine if patient has part d coverage for medications.  Tolvaptan will be a very expensive medication .

## 2017-09-14 NOTE — Progress Notes (Signed)
Patient Name: Kirk Sanchez Date of Encounter: 09/14/2017  Hospital Problem List     Principal Problem:   Atrial fibrillation with RVR (HCC) Active Problems:   Arteriosclerosis of coronary artery   HLD (hyperlipidemia)   BP (high blood pressure)   Hypothyroidism   Hyponatremia    Patient Profile     84 year old male with hyponatremia, hypertension and atrial fibrillation with variable but fairly rapid ventricular response  Subjective   Some shortness of breath  Inpatient Medications    . aspirin  81 mg Oral Daily  . azithromycin  500 mg Oral Daily  . enoxaparin (LOVENOX) injection  40 mg Subcutaneous Q24H  . famotidine  20 mg Oral BID  . levothyroxine  25 mcg Oral QAC breakfast  . metoprolol succinate  50 mg Oral Daily  . simvastatin  40 mg Oral q1800  . tolvaptan  15 mg Oral Q24H    Vital Signs    Vitals:   09/13/17 1127 09/13/17 1511 09/13/17 1929 09/14/17 0342  BP: 128/79 122/87 96/68 125/83  Pulse: 83 77 81 90  Resp:   17 17  Temp: 98 F (36.7 C) 98.1 F (36.7 C) 98.2 F (36.8 C) (!) 97.4 F (36.3 C)  TempSrc: Oral Oral Oral Oral  SpO2: 94% 96% 94% 94%  Weight:      Height:        Intake/Output Summary (Last 24 hours) at 09/14/2017 0810 Last data filed at 09/14/2017 0500 Gross per 24 hour  Intake 720 ml  Output 600 ml  Net 120 ml   Filed Weights   09/10/17 2001 09/11/17 0057 09/11/17 0608  Weight: 86.6 kg (191 lb) 87.2 kg (192 lb 4.8 oz) 86.9 kg (191 lb 8 oz)    Physical Exam    GEN: Well nourished, well developed, in no acute distress.  HEENT: normal.  Neck: Supple, no JVD, carotid bruits, or masses. Cardiac: irr, irr, no murmurs, rubs, or gallops. No clubbing, cyanosis, edema.  Radials/DP/PT 2+ and equal bilaterally.  Respiratory:  Respirations regular and unlabored, clear to auscultation bilaterally. GI: Soft, nontender, nondistended, BS + x 4. MS: no deformity or atrophy. Skin: warm and dry, no rash. Neuro:  Strength and sensation  are intact. Psych: Normal affect.  Labs    CBC No results for input(s): WBC, NEUTROABS, HGB, HCT, MCV, PLT in the last 72 hours. Basic Metabolic Panel Recent Labs    40/98/11 0939  09/14/17 0133 09/14/17 0345  NA 117*   < > 120* 120*  K 4.3  --   --  4.4  CL 79*  --   --  84*  CO2 25  --   --  28  GLUCOSE 120*  --   --  103*  BUN 21*  --   --  21*  CREATININE 0.69  --   --  0.67  CALCIUM 8.5*  --   --  8.7*   < > = values in this interval not displayed.   Liver Function Tests No results for input(s): AST, ALT, ALKPHOS, BILITOT, PROT, ALBUMIN in the last 72 hours. No results for input(s): LIPASE, AMYLASE in the last 72 hours. Cardiac Enzymes Recent Labs    09/11/17 1436  TROPONINI 0.08*   BNP No results for input(s): BNP in the last 72 hours. D-Dimer No results for input(s): DDIMER in the last 72 hours. Hemoglobin A1C No results for input(s): HGBA1C in the last 72 hours. Fasting Lipid Panel No results for input(s): CHOL,  HDL, LDLCALC, TRIG, CHOLHDL, LDLDIRECT in the last 72 hours. Thyroid Function Tests Recent Labs    09/13/17 0939  TSH 2.322    Telemetry    Atrial fibrillation with rapid ventricular response  ECG    Atrial fibrillation with variable ventricular response  Radiology    Dg Chest 2 View  Result Date: 09/12/2017 CLINICAL DATA:  Shortness of breath EXAM: CHEST  2 VIEW COMPARISON:  04/08/2016 FINDINGS: There is mild bilateral interstitial prominence. There is no focal parenchymal opacity. There is no pleural effusion or pneumothorax. There is mild stable cardiomegaly. There is thoracic aortic atherosclerosis. 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 IMPRESSION: Cardiomegaly with mild pulmonary vascular congestion. Electronically Signed   By: Elige KoHetal  Patel   On: 09/12/2017 09:54   Dg Chest 2 View  Result Date: 09/10/2017 CLINICAL DATA:  Chest pain EXAM: CHEST  2 VIEW COMPARISON:  04/08/2016, 07/24/2014 FINDINGS: Mildly low  lung volumes. Streaky bibasilar atelectasis. Bronchitic changes at the bases. No focal consolidation or pleural effusion cardiomegaly with aortic atherosclerosis. No pneumothorax. Small hiatal hernia. IMPRESSION: Bronchitic changes at the bases with streaky atelectasis. Cardiomegaly. Electronically Signed   By: Jasmine PangKim  Fujinaga M.D.   On: 09/10/2017 21:55    Assessment & Plan    84 year old male with history of atrial fibrillation admitted with A. fib with rapid ventricular response.  Complained of shortness of breath and was noted to have volume overload.  He also is hyponatremic.  His hyponatremia is worsened with Lasix.  He has been changed from loop diuretic to tolvaptan.  His heart rate has improved.  Would continue with metoprolol at current dose following rate.  Appreciate nephrology input.  Signed, Darlin PriestlyKenneth A. Celes Dedic MD 09/14/2017, 8:10 AM  Pager: [phone]

## 2017-09-14 NOTE — Progress Notes (Signed)
[facility]  ROUNDING NOTE   Subjective:  Serum sodium remains low at 120. Tolvaptan was increased to 30 mg p.o. daily this a.m. Appreciate input from palliative care.  Objective:  Vital signs in last 24 hours:  Temp:  [97.4 F (36.3 C)-98.2 F (36.8 C)] 97.8 F (36.6 C) (02/13 0800) Pulse Rate:  [81-95] 95 (02/13 0800) Resp:  [16-17] 16 (02/13 0800) BP: (96-136)/(68-83) 136/83 (02/13 0800) SpO2:  [94 %] 94 % (02/13 0800)  Weight change:  Filed Weights   09/10/17 2001 09/11/17 0057 09/11/17 1610  Weight: 86.6 kg (191 lb) 87.2 kg (192 lb 4.8 oz) 86.9 kg (191 lb 8 oz)    Intake/Output: I/O last 3 completed shifts: In: 720 [P.O.:720] Out: 600 [Urine:600]   Intake/Output this shift:  No intake/output data recorded.  Physical Exam: General: No acute distress  Head: Normocephalic, atraumatic. Moist oral mucosal membranes  Eyes: Anicteric  Neck: Supple, trachea midline  Lungs:  Basilar rales, normal effort  Heart: S1S2 irregular  Abdomen:  Soft, nontender, bowel sounds present  Extremities: trace peripheral edema.  Neurologic: Awake, alert, following commands  Skin: No lesions       Basic Metabolic Panel: Recent Labs  Lab 09/11/17 0229 09/12/17 0321 09/13/17 0526 09/13/17 0939 09/13/17 1643 09/14/17 0133 09/14/17 0345  NA 125* 124* 118* 117* 117* 120* 120*  K 4.2 3.9 4.5 4.3  --   --  4.4  CL 87* 84* 81* 79*  --   --  84*  CO2 26 27 24 25   --   --  28  GLUCOSE 152* 114* 107* 120*  --   --  103*  BUN 12 15 20  21*  --   --  21*  CREATININE 0.61 0.63 0.67 0.69  --   --  0.67  CALCIUM 8.4* 8.5* 8.5* 8.5*  --   --  8.7*    Liver Function Tests: Recent Labs  Lab 09/08/17 1054  AST 20  ALT 12  BILITOT 0.7  PROT 6.3   No results for input(s): LIPASE, AMYLASE in the last 168 hours. No results for input(s): AMMONIA in the last 168 hours.  CBC: Recent Labs  Lab 09/08/17 1054 09/10/17 2035 09/11/17 0229  WBC 4.8 8.1 8.0  NEUTROABS 3,370   --   --   HGB 11.5* 12.5* 11.7*  HCT 34.9* 37.6* 35.3*  MCV 87.3 87.2 87.5  PLT 152 169 152    Cardiac Enzymes: Recent Labs  Lab 09/10/17 2035 09/11/17 0229 09/11/17 0758 09/11/17 1436  TROPONINI 0.05* 0.04* 0.07* 0.08*    BNP: Invalid input(s): POCBNP  CBG: No results for input(s): GLUCAP in the last 168 hours.  Microbiology: No results found for this or any previous visit.  Coagulation Studies: No results for input(s): LABPROT, INR in the last 72 hours.  Urinalysis: No results for input(s): COLORURINE, LABSPEC, PHURINE, GLUCOSEU, HGBUR, BILIRUBINUR, KETONESUR, PROTEINUR, UROBILINOGEN, NITRITE, LEUKOCYTESUR in the last 72 hours.  Invalid input(s): APPERANCEUR    Imaging: No results found.   Medications:    . aspirin  81 mg Oral Daily  . enoxaparin (LOVENOX) injection  40 mg Subcutaneous Q24H  . famotidine  20 mg Oral BID  . levothyroxine  25 mcg Oral QAC breakfast  . metoprolol succinate  50 mg Oral Daily  . simvastatin  40 mg Oral q1800  . tolvaptan  30 mg Oral Q24H   acetaminophen **OR** acetaminophen, ondansetron **OR** ondansetron (ZOFRAN) IV  Assessment/ Plan:  84 y.o. male  with a PMHx of congestive heart failure ejection fraction 40-45%, atrial fibrillation, hypertension, hyperlipidemia, and hypothyroidism who was [facility] on 09/10/2017 for evaluation of chest pain and shortness of breath.  1.  Hyponatremia, suspect due to volume overload vs SIADH. 2.  Acute systolic heart failure, EF 40-45%. 3.  Anemia NOS.   Plan:  Serum sodium did come up slightly to 120.  Tolvaptan has been increased to 30 mg p.o. daily.  We will see if this will have a greater effect.  However overall the patient has a very guarded prognosis with worsening serum sodium in the setting of known heart failure.  Appreciate input from palliative care and we do plan to meet a bit further with the patient and his family tomorrow as well.  For now continue supportive  care.    LOS: 2 Manasvi Dickard 2/13/20194:14 PM

## 2017-09-14 NOTE — Progress Notes (Signed)
[facility] [place] at [facility]   PATIENT NAME: Kirk DubinHoward Sanchez    MR#:  [id number]  DATE OF BIRTH:  Feb 01, 1925  SUBJECTIVE:  CHIEF COMPLAINT: Patient denies any complaints today.  2 sons at bedside, agreeable with palliative care.  Patient is very hard of hearing.  Out of bed to chair  REVIEW OF SYSTEMS:  CONSTITUTIONAL: No fever, fatigue or weakness.  EYES: No blurred or double vision.  EARS, NOSE, AND THROAT: No tinnitus or ear pain.  RESPIRATORY: No cough, shortness of breath, wheezing or hemoptysis.  CARDIOVASCULAR: No chest pain, orthopnea, edema.  GASTROINTESTINAL: No nausea, vomiting, diarrhea or abdominal pain.  GENITOURINARY: No dysuria, hematuria.  ENDOCRINE: No polyuria, nocturia,  HEMATOLOGY: No anemia, easy bruising or bleeding SKIN: No rash or lesion. MUSCULOSKELETAL: No joint pain or arthritis.   NEUROLOGIC: No tingling, numbness, weakness.  PSYCHIATRY: No anxiety or depression.   DRUG ALLERGIES:  No Known Allergies  VITALS:  Blood pressure 99/60, pulse 84, temperature 97.7 F (36.5 C), temperature source Oral, resp. rate 17, height 5\' 8"  (1.727 m), weight 86.9 kg (191 lb 8 oz), SpO2 96 %.  PHYSICAL EXAMINATION:  GENERAL:  84 y.o.-year-old patient lying in the bed with no acute distress.  EYES: Pupils equal, round, reactive to light and accommodation. No scleral icterus. Extraocular muscles intact.  HEENT: Head atraumatic, normocephalic. Oropharynx and nasopharynx clear.  NECK:  Supple, no jugular venous distention. No thyroid enlargement, no tenderness.  LUNGS: rattly breath sounds bilaterally, no wheezing, positive rales,rhonchi or crepitation. No use of accessory muscles of respiration.  CARDIOVASCULAR: Irregularly regular. No murmurs, rubs, or gallops.  ABDOMEN: Soft, nontender, nondistended. Bowel sounds present. No organomegaly or mass.  EXTREMITIES: No pedal edema, cyanosis, or clubbing.  NEUROLOGIC: Awake and alert oriented  x2-3 with intermittent episodes of confusion PSYCHIATRIC: The patient is alert and oriented x 3.  SKIN: No obvious rash, lesion, or ulcer.    LABORATORY PANEL:   CBC Recent Labs  Lab 09/11/17 0229  WBC 8.0  HGB 11.7*  HCT 35.3*  PLT 152   ------------------------------------------------------------------------------------------------------------------  Chemistries  Recent Labs  Lab 09/08/17 1054  09/14/17 0345  NA 133*   < > 120*  K 4.6   < > 4.4  CL 96*   < > 84*  CO2 26   < > 28  GLUCOSE 89   < > 103*  BUN 15   < > 21*  CREATININE 0.73   < > 0.67  CALCIUM 9.0   < > 8.7*  AST 20  --   --   ALT 12  --   --   BILITOT 0.7  --   --    < > = values in this interval not displayed.   ------------------------------------------------------------------------------------------------------------------  Cardiac Enzymes Recent Labs  Lab 09/11/17 1436  TROPONINI 0.08*   ------------------------------------------------------------------------------------------------------------------  RADIOLOGY:  No results found.  EKG:   Orders placed or performed during the hospital encounter of 09/10/17  . EKG 12-Lead  . EKG 12-Lead  . ED EKG within 10 minutes  . 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  Tolvaptan is added  to the regimen Check urine osmolality, urine lites Lasix discontinued as tolvaptan is added Patient's baseline seems to be at 130s Concerning as hyponatremia is worsening in the light of CHF exacerbation.  Discussed with the  patient and 2 sons at bedside  # Atrial fibrillation with RVR (HCC) - rate is better with IV diltiazem dose. continue his home dose  of Lopressor echocardiogram -has revealed 40-45% of ejection fraction [facility] cardiology following Troponins 0.05-0.04-0.07-0.08, no significant trend     # Arteriosclerosis of coronary artery -continue home meds, other workup as above    # BP (high blood pressure) -continue home medications    # HLD (hyperlipidemia) -statin    # Hypothyroidism -home dose thyroid replacement   Very poor prognosis with high mortality.  Palliative care is consulted regarding goals of care   All the records are reviewed and case discussed with Care Management/Social Workerr. Management plans discussed with the patient, family and they are in agreement.  CODE STATUS: CODE STATUS changed to DO NOT RESUSCITATE TOTAL TIME TAKING CARE OF THIS PATIENT: 36 minutes.   POSSIBLE D/C IN 2  DAYS, DEPENDING ON CLINICAL CONDITION.  Note: This dictation was prepared with Dragon dictation along with smaller phrase technology. Any transcriptional errors that result from this process are unintentional.   Ramonita Lab M.D on 09/14/2017 at 10:23 PM  Between 7am to 6pm - Pager - [phone] After 6pm go to [email or web address] - password [email or web address] [facility]  [place] [facility]  Office  [phone]  CC: Primary care physician; Kerman Passey, MD

## 2017-09-14 NOTE — Consult Note (Signed)
Consultation Note Date: 09/14/2017   Patient Name: Kirk Sanchez  DOB: 1924-10-03  MRN: [id number]  Age / Sex: 84 y.o., male  PCP: Kerman PasseyLada, Melinda P, MD Referring Physician: Ramonita LabGouru, Aruna, MD  Reason for Consultation: Establishing goals of care and Psychosocial/spiritual support  HPI/Patient Profile: 84 y.o. male   admitted on 09/10/2017 with  a PMHx ofcongestive heart failure ejection fraction 40-45%, atrial fibrillation, hypertension, hyperlipidemia,and hypothyroidismwho was [facility]9/2019for evaluation of chest pain and shortness of breath.   He was found to be hypo-Maitri MIC which worsened with Lasix, nephrology was consulted he is currently on Tolvaptan.  Plan is to continue to monitor serum sodium closely however prognosis is poor  Patient was living at home alone and thriving prior to this hospitalization  Patient and family face treatment option decisions, advanced directive decisions and anticipatory care needs.    Clinical Assessment and Goals of Care:  This NP Lorinda CreedMary Larach reviewed medical records, received report from team, assessed the patient and then meet at the patient's bedside along with his two sons Iantha FallenKenneth and Bracks  to discuss diagnosis, prognosis, GOC, EOL wishes disposition and options.  Concept of Hospice and Palliative Care were discussed  A discussion was had today regarding advanced directives.  Concepts specific to code status, artifical feeding and hydration, continued IV antibiotics and rehospitalization was had.  The difference between a aggressive medical intervention path  and a palliative comfort care path for this patient at this time was had.  Values and goals of care important to patient and family were attempted to be elicited.   It was very difficult to include the patient in today's conversation secondary to his hard of hearing status.  If he has his  hearing aid in with a new battery but again most of the conversation I believe was lost to the patient.  He did tell me that his "sons were his ears" at this point in time  Questions and concerns addressed.   Family encouraged to call with questions or concerns.    PMT will continue to support holistically.  Plan Is to meet with family again in the morning 1000 for further discussion regarding plan of care.    There is no documented healthcare power of attorney but the patient's 3 sons work to gather to support the best interest of Mr. Durwin NoraDixon.  SUMMARY OF RECOMMENDATIONS    Code Status/Advance Care Planning:  DNR    Palliative Prophylaxis:   Aspiration, Bowel Regimen, Delirium Protocol, Frequent Pain Assessment and Oral Care  Additional Recommendations (Limitations, Scope, Preferences):  Full Scope Treatment  Psycho-social/Spiritual:   Desire for further Chaplaincy support:no  Additional Recommendations: Education on Hospice  Prognosis:   Unable to determine  Discharge Planning: To Be Determined      Primary Diagnoses: Present on Admission: . Arteriosclerosis of coronary artery . BP (high blood pressure) . HLD (hyperlipidemia) . Hypothyroidism . Atrial fibrillation with RVR (HCC) . Hyponatremia   I have reviewed the medical record, interviewed the patient  and family, and examined the patient. The following aspects are pertinent.  Past Medical History:  Diagnosis Date  . Atrial fibrillation (HCC)   . Hyperlipidemia   . Hypertension   . Kidney stone   . Presbycusis of both ears 03/06/2016  . Thyroid disease    Social History   Socioeconomic History  . Marital status: Widowed    Spouse name: None  . Number of children: None  . Years of education: None  . Highest education level: None  Social Needs  . Financial resource strain: None  . Food insecurity - worry: None  . Food insecurity - inability: None  . Transportation needs - medical: None  .  Transportation needs - non-medical: None  Occupational History  . None  Tobacco Use  . Smoking status: Never Smoker  . Smokeless tobacco: Never Used  Substance and Sexual Activity  . Alcohol use: No    Alcohol/week: 0.0 oz  . Drug use: No  . Sexual activity: No  Other Topics Concern  . None  Social History Narrative  . None   Family History  Problem Relation Age of Onset  . Cancer Mother        stomach  . Asthma Father   . Stroke Neg Hx   . Heart disease Neg Hx    Scheduled Meds: . aspirin  81 mg Oral Daily  . enoxaparin (LOVENOX) injection  40 mg Subcutaneous Q24H  . famotidine  20 mg Oral BID  . levothyroxine  25 mcg Oral QAC breakfast  . metoprolol succinate  50 mg Oral Daily  . simvastatin  40 mg Oral q1800  . tolvaptan  30 mg Oral Q24H   Continuous Infusions: PRN Meds:.acetaminophen **OR** acetaminophen, ondansetron **OR** ondansetron (ZOFRAN) IV Medications Prior to Admission:  Prior to Admission medications   Medication Sig Start Date End Date Taking? Authorizing Provider  aspirin 81 MG tablet Take 81 mg by mouth daily.   Yes [provider]  Calcium Carb-Cholecalciferol (CALCIUM-VITAMIN D) 500-200 MG-UNIT tablet Take 1 tablet by mouth 2 (two) times daily.    Yes [provider]  levothyroxine (SYNTHROID, LEVOTHROID) 25 MCG tablet TAKE 1 TABLET BY MOUTH ONCE DAILY ON AN EMPTY STOMACH. WAIT 30 MINUTES BEFORE TAKING OTHER MEDS. 06/24/17  Yes Lada, Janit Bern, MD  metoprolol succinate (TOPROL-XL) 50 MG 24 hr tablet Take 50 mg by mouth daily. 05/09/17  Yes [provider]  ranitidine (ZANTAC) 150 MG tablet Take 1 tablet (150 mg total) by mouth 2 (two) times daily. If needed for heartburn 09/08/16  Yes Lada, Janit Bern, MD  simvastatin (ZOCOR) 40 MG tablet One by mouth every evening for cholesterol 03/03/17  Yes Lada, Janit Bern, MD  prednisoLONE acetate (PRED FORTE) 1 % ophthalmic suspension Place 1 drop into both eyes as needed.  06/22/17   [provider]   No Known Allergies Review of Systems  Unable to perform ROS: Other    Physical Exam  Constitutional: He is oriented to person, place, and time. He appears well-developed.  HENT:  Right Ear: Decreased hearing is noted.  Left Ear: Decreased hearing is noted.  Abdominal: Soft.  Musculoskeletal:       Right lower leg: He exhibits edema.  Neurological: He is alert and oriented to person, place, and time.  Skin: Skin is warm and dry.    Vital Signs: BP 136/83 (BP Location: Left Arm)   Pulse 95   Temp 97.8 F (36.6 C) (Oral)   Resp 16  Ht 5\' 8"  (1.727 m)   Wt 86.9 kg (191 lb 8 oz)   SpO2 94%   BMI 29.12 kg/m  Pain Assessment: No/denies pain   Pain Score: 0-No pain   SpO2: SpO2: 94 % O2 Device:SpO2: 94 % O2 Flow Rate: .O2 Flow Rate (L/min): 4 L/min  IO: Intake/output summary:   Intake/Output Summary (Last 24 hours) at 09/14/2017 1137 Last data filed at 09/14/2017 0500 Gross per 24 hour  Intake 480 ml  Output 600 ml  Net -120 ml    LBM: Last BM Date: 09/13/17 Baseline Weight: Weight: 86.6 kg (191 lb) Most recent weight: Weight: 86.9 kg (191 lb 8 oz)     Palliative Assessment/Data: 50 % at best   Discussed with Dr Cherylann Ratel and Elana Alm CMRN  Time In: 1430 Time Out: 1530 Time Total: 60 minutes Greater than 50%  of this time was spent counseling and coordinating care related to the above assessment and plan.  Signed by: Lorinda Creed, NP   Please contact Palliative Medicine Team phone at [phone] for questions and concerns.  For individual provider: See Loretha Stapler

## 2017-09-15 ENCOUNTER — Inpatient Hospital Stay: Payer: Medicare Other

## 2017-09-15 DIAGNOSIS — R531 Weakness: Secondary | ICD-10-CM

## 2017-09-15 DIAGNOSIS — I5023 Acute on chronic systolic (congestive) heart failure: Secondary | ICD-10-CM

## 2017-09-15 DIAGNOSIS — I4891 Unspecified atrial fibrillation: Secondary | ICD-10-CM

## 2017-09-15 LAB — BASIC METABOLIC PANEL
Anion gap: 11 (ref 5–15)
BUN: 18 mg/dL (ref 6–20)
CHLORIDE: 91 mmol/L — AB (ref 101–111)
CO2: 31 mmol/L (ref 22–32)
CREATININE: 0.71 mg/dL (ref 0.61–1.24)
Calcium: 8.9 mg/dL (ref 8.9–10.3)
GFR calc Af Amer: >60 mL/min (ref >60)
GFR calc non Af Amer: >60 mL/min (ref >60)
GLUCOSE: 100 mg/dL — AB (ref 65–99)
Potassium: 3.4 mmol/L — ABNORMAL LOW (ref 3.5–5.1)
SODIUM: 133 mmol/L — AB (ref 135–145)

## 2017-09-15 NOTE — Care Management Important Message (Signed)
Important Message  Patient Details  Name: Kirk Sanchez MRN: [id number] Date of Birth: 1925/07/24   Medicare Important Message Given:  Yes Signed IM notice given    Eber HongGreene, Millan Legan R, RN 09/15/2017, 2:31 PM

## 2017-09-15 NOTE — Progress Notes (Signed)
Patient ID: Kirk HeadsHoward H Linch, male   DOB: 12-17-1924, 84 y.o.   MRN: [id number]  This NP visited patient at the bedside as a follow up to  yesterday's GOCs meeting, for palliative needs and emotional support.  Large family gathered at bedside for continued conversation regarding diagnosis, prognosis, goals of care, and treatment options.  Patient is alert and oriented but his serious HOH prohibits a engaging conversation.  Dr. Cherylann RatelLateef shared from a nephrology perspective the patient's current medical situation.  His sodium has improved and Tolvapten will be stopped.  Plan is for patient to discharge to his son Iantha FallenKenneth house with PT and I recommend a PCS to follow on discharge.  He will have follow-up appointments with nephology and cardiology as an OP within the next few weeks.  I shared with family my concern for the  high risk to de-compensate.   Discussed with patient and family the importance of continued conversation with  their  medical providers regarding overall plan of care and treatment options,  ensuring decisions are within the context of the patients values and GOCs.  Questions and concerns addressed   Discussed with Dr Cherylann RatelLateef     Time in 1000          Time out  1040   Total time spent on the unit was 40 minutes  Greater than 50% of the time was spent in counseling and coordination of care  Lorinda CreedMary Larach NP  Palliative Medicine Team Team Phone # [phone] Pager [phone]

## 2017-09-15 NOTE — Progress Notes (Signed)
[facility]  ROUNDING NOTE   Subjective:  Patient seen at bedside. His serum sodium has improved significantly and is up to 133 today. Family meeting was conducted today.  Objective:  Vital signs in last 24 hours:  Temp:  [97.7 F (36.5 C)-98.1 F (36.7 C)] 98.1 F (36.7 C) (02/14 0655) Pulse Rate:  [84-88] 87 (02/14 0655) Resp:  [17-20] 17 (02/14 0655) BP: (99-136)/(60-92) 131/85 (02/14 0655) SpO2:  [95 %-96 %] 96 % (02/14 0655)  Weight change:  Filed Weights   09/10/17 2001 09/11/17 0057 09/11/17 [id number]  Weight: 86.6 kg (191 lb) 87.2 kg (192 lb 4.8 oz) 86.9 kg (191 lb 8 oz)    Intake/Output: I/O last 3 completed shifts: In: 480 [P.O.:480] Out: 600 [Urine:600]   Intake/Output this shift:  Total I/O In: 240 [P.O.:240] Out: -   Physical Exam: General: No acute distress  Head: Normocephalic, atraumatic. Moist oral mucosal membranes  Eyes: Anicteric  Neck: Supple, trachea midline  Lungs:  Basilar rales, normal effort  Heart: S1S2 irregular  Abdomen:  Soft, nontender, bowel sounds present  Extremities: trace peripheral edema.  Neurologic: Awake, alert, following commands  Skin: No lesions       Basic Metabolic Panel: Recent Labs  Lab 09/12/17 0321 09/13/17 0526 09/13/17 0939 09/13/17 1643 09/14/17 0133 09/14/17 0345 09/15/17 0636  NA 124* 118* 117* 117* 120* 120* 133*  K 3.9 4.5 4.3  --   --  4.4 3.4*  CL 84* 81* 79*  --   --  84* 91*  CO2 27 24 25   --   --  28 31  GLUCOSE 114* 107* 120*  --   --  103* 100*  BUN 15 20 21*  --   --  21* 18  CREATININE 0.63 0.67 0.69  --   --  0.67 0.71  CALCIUM 8.5* 8.5* 8.5*  --   --  8.7* 8.9    Liver Function Tests: No results for input(s): AST, ALT, ALKPHOS, BILITOT, PROT, ALBUMIN in the last 168 hours. No results for input(s): LIPASE, AMYLASE in the last 168 hours. No results for input(s): AMMONIA in the last 168 hours.  CBC: Recent Labs  Lab 09/10/17 2035 09/11/17 0229  WBC 8.1 8.0  HGB  12.5* 11.7*  HCT 37.6* 35.3*  MCV 87.2 87.5  PLT 169 152    Cardiac Enzymes: Recent Labs  Lab 09/10/17 2035 09/11/17 0229 09/11/17 0758 09/11/17 1436  TROPONINI 0.05* 0.04* 0.07* 0.08*    BNP: Invalid input(s): POCBNP  CBG: No results for input(s): GLUCAP in the last 168 hours.  Microbiology: No results found for this or any previous visit.  Coagulation Studies: No results for input(s): LABPROT, INR in the last 72 hours.  Urinalysis: No results for input(s): COLORURINE, LABSPEC, PHURINE, GLUCOSEU, HGBUR, BILIRUBINUR, KETONESUR, PROTEINUR, UROBILINOGEN, NITRITE, LEUKOCYTESUR in the last 72 hours.  Invalid input(s): APPERANCEUR    Imaging: Dg Chest 2 View  Result Date: 09/15/2017 CLINICAL DATA:  Shortness of Breath EXAM: CHEST  2 VIEW COMPARISON:  September 12, 2017 FINDINGS: There is no edema or consolidation. There remains cardiomegaly with pulmonary vascularity currently within normal limits. No adenopathy. There is aortic atherosclerosis. There is calcification in the left anterior descending coronary artery. Bones are osteoporotic. There is degenerative change in the thoracic spine and in each shoulder. IMPRESSION: Cardiomegaly without edema or consolidation. There is aortic atherosclerosis. There is also calcification in the left anterior descending coronary artery. Bones are osteoporotic. Aortic Atherosclerosis (ICD10-I70.0). Electronically Signed  By: Bretta Bang III M.D.   On: 09/15/2017 09:36     Medications:    . aspirin  81 mg Oral Daily  . enoxaparin (LOVENOX) injection  40 mg Subcutaneous Q24H  . famotidine  20 mg Oral BID  . levothyroxine  25 mcg Oral QAC breakfast  . metoprolol succinate  50 mg Oral Daily  . simvastatin  40 mg Oral q1800  . tolvaptan  30 mg Oral Q24H   acetaminophen **OR** acetaminophen, ondansetron **OR** ondansetron (ZOFRAN) IV  Assessment/ Plan:  84 y.o. male  with a PMHx of congestive heart failure ejection fraction  40-45%, atrial fibrillation, hypertension, hyperlipidemia, and hypothyroidism who was admitted to [facility] on 09/10/2017 for evaluation of chest pain and shortness of breath.  1.  Hyponatremia, suspect due to volume overload vs SIADH. 2.  Acute systolic heart failure, EF 40-45%. 3.  Anemia NOS.   Plan:  Serum sodium has improved significantly today.  Serum sodium up to 133.  Therefore we will go ahead and discontinue tolvaptan at this point.  Going for we have counseled him on fluid restriction of 1.2 L/day and to limit sodium to 2 g/day.  It appears that he will be discharged home today.  Overall he has a guarded prognosis given his underlying heart failure and overall health status.    LOS: 3 Kirk Sanchez 2/14/201911:47 AM

## 2017-09-15 NOTE — Progress Notes (Signed)
[facility] [place] at [facility]   PATIENT NAME: Kirk Sanchez    MR#:  [id number]  DATE OF BIRTH:  [birth year]  SUBJECTIVE:  CHIEF COMPLAINT: Patient doing much better sodium improved REVIEW OF SYSTEMS:  CONSTITUTIONAL: No fever, fatigue or weakness.  EYES: No blurred or double vision.  EARS, NOSE, AND THROAT: No tinnitus or ear pain.  RESPIRATORY: No cough, shortness of breath, wheezing or hemoptysis.  CARDIOVASCULAR: No chest pain, orthopnea, edema.  GASTROINTESTINAL: No nausea, vomiting, diarrhea or abdominal pain.  GENITOURINARY: No dysuria, hematuria.  ENDOCRINE: No polyuria, nocturia,  HEMATOLOGY: No anemia, easy bruising or bleeding SKIN: No rash or lesion. MUSCULOSKELETAL: No joint pain or arthritis.   NEUROLOGIC: No tingling, numbness, weakness.  PSYCHIATRY: No anxiety or depression.   DRUG ALLERGIES:  No Known Allergies  VITALS:  Blood pressure 131/85, pulse 87, temperature 98.1 F (36.7 C), temperature source Oral, resp. rate 17, height 5\' 8"  (1.727 m), weight 191 lb 8 oz (86.9 kg), SpO2 96 %.  PHYSICAL EXAMINATION:  GENERAL:  84 y.o.-year-old patient lying in the bed with no acute distress.  EYES: Pupils equal, round, reactive to light and accommodation. No scleral icterus. Extraocular muscles intact.  HEENT: Head atraumatic, normocephalic. Oropharynx and nasopharynx clear.  NECK:  Supple, no jugular venous distention. No thyroid enlargement, no tenderness.  LUNGS: rattly breath sounds bilaterally, no wheezing, positive rales,rhonchi or crepitation. No use of accessory muscles of respiration.  CARDIOVASCULAR: Irregularly regular. No murmurs, rubs, or gallops.  ABDOMEN: Soft, nontender, nondistended. Bowel sounds present. No organomegaly or mass.  EXTREMITIES: No pedal edema, cyanosis, or clubbing.  NEUROLOGIC: Awake and alert oriented x2-3 with intermittent episodes of confusion PSYCHIATRIC: The patient is alert and oriented x 3.   SKIN: No obvious rash, lesion, or ulcer.    LABORATORY PANEL:   CBC Recent Labs  Lab 09/11/17 0229  WBC 8.0  HGB 11.7*  HCT 35.3*  PLT 152   ------------------------------------------------------------------------------------------------------------------  Chemistries  Recent Labs  Lab 09/15/17 0636  NA 133*  K 3.4*  CL 91*  CO2 31  GLUCOSE 100*  BUN 18  CREATININE 0.71  CALCIUM 8.9   ------------------------------------------------------------------------------------------------------------------  Cardiac Enzymes Recent Labs  Lab 09/11/17 1436  TROPONINI 0.08*   ------------------------------------------------------------------------------------------------------------------  RADIOLOGY:  Dg Chest 2 View  Result Date: 09/15/2017 CLINICAL DATA:  Shortness of Breath EXAM: CHEST  2 VIEW COMPARISON:  September 12, 2017 FINDINGS: There is no edema or consolidation. There remains cardiomegaly with pulmonary vascularity currently within normal limits. No adenopathy. There is aortic atherosclerosis. There is calcification in the left anterior descending coronary artery. Bones are osteoporotic. There is degenerative change in the thoracic spine and in each shoulder. IMPRESSION: Cardiomegaly without edema or consolidation. There is aortic atherosclerosis. There is also calcification in the left anterior descending coronary artery. Bones are osteoporotic. Aortic Atherosclerosis (ICD10-I70.0). Electronically Signed   By: Bretta Bang III M.D.   On: 09/15/2017 09:36    EKG:   Orders placed or performed during the hospital encounter of 09/10/17  . EKG 12-Lead  . EKG 12-Lead  . ED EKG within 10 minutes  . 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   All the records are reviewed and case discussed with Care Management/Social Workerr. Management plans discussed with the patient, family and they are in agreement.  CODE STATUS: CODE STATUS changed to DO NOT RESUSCITATE TOTAL TIME TAKING CARE OF THIS PATIENT: 32 minutes.   POSSIBLE D/C IN 2  DAYS, DEPENDING ON CLINICAL CONDITION.  Note: This dictation was prepared with Dragon dictation along with smaller phrase technology. Any transcriptional errors that result from this process are unintentional.   Auburn BilberryShreyang Kyan Giannone M.D on 09/15/2017 at 5:03 PM  Between 7am to 6pm - Pager - [phone] After 6pm go to [email or web address] - password [email or web address] [facility]  [facility]  Office  [phone]  CC: Primary care physician; Kerman PasseyLada, Melinda P, MD

## 2017-09-15 NOTE — Care Management (Signed)
Spoke with patient's son Valentina ShaggyKenneth Anwar: [address] [place]  Phone [phone].  At discharge, patient will be going to this address at discharge.  Patient has a walker.  Agreeable to have home health PT RN and aide.  No agency preference.  Referral called and accepted by [facility]

## 2017-09-15 NOTE — Progress Notes (Signed)
Physical Therapy Treatment Patient Details Name: Kirk Sanchez MRN: [id number] DOB: [birth year] Today's Date: 09/15/2017    History of Present Illness Kirk Sanchez  is a 84 y.o. male who presents with chest pain, malaise, generalized weakness.  Patient states he has been feeling bad for the past couple of days, he started developing some palpitations and significant chest pain.  He does have a history of atrial fibrillation, and presented to the ED today in A. fib with RVR.  His rate was moderately controlled initially with some IV diltiazem.  He had a positive troponin, and he was found to be mildly hyponatremic.  Hospitalist were called for admission. Pt now admitted for acute systolic CHF, hyponatremia, and afib with RVR    PT Comments    Pt once again completes a full lap around RN station with therapist. Good stability with rolling walker. HR between 105-113 bpm with ambulation and still unable to obtain SaO2 reading from either hand. Pt denies DOE with ambulation and no visibule respiratory distress. He completes seated exercises at EOB. Pt will benefit from PT services to address deficits in strength, balance, and mobility in order to return to full function at home.     Follow Up Recommendations  Home health PT;Supervision - Intermittent     Equipment Recommendations  Rolling walker with 5" wheels;3in1 (PT)    Recommendations for Other Services       Precautions / Restrictions Precautions Precautions: Fall Restrictions Weight Bearing Restrictions: No    Mobility  Bed Mobility Overal bed mobility: Modified Independent             General bed mobility comments: Pt able to move from sit to supine without external assist from therapist. HOB elevated and use of bed rails.   Transfers Overall transfer level: Needs assistance Equipment used: Rolling walker (2 wheeled) Transfers: Sit to/from Stand Sit to Stand: Min guard         General transfer comment: Pt  demonstrates safe hand placement. No LOB with transfers. Pt able to stabilize with UE support on rolling walker  Ambulation/Gait Ambulation/Gait assistance: Min guard Ambulation Distance (Feet): 200 Feet Assistive device: Rolling walker (2 wheeled)   Gait velocity: Decreased Gait velocity interpretation: <1.8 ft/sec, indicative of risk for recurrent falls General Gait Details: Pt once again completes a full lap around RN station with therapist. Good stability with rolling walker. HR between 105-113 bpm with ambulation and still unable to obtain SaO2 reading from either hand. Pt denies DOE with ambulation and no visibule respiratory distress   Stairs            Wheelchair Mobility    Modified Rankin (Stroke Patients Only)       Balance Overall balance assessment: Needs assistance Sitting-balance support: No upper extremity supported Sitting balance-Leahy Scale: Good     Standing balance support: No upper extremity supported Standing balance-Leahy Scale: Fair Standing balance comment: Requires UE support to place feet together. Able to maintain balance in wide and narrow stance withotu UE support. Positive Rhomberg. Unable to attempt single leg balance due to instability                            Cognition Arousal/Alertness: Awake/alert Behavior During Therapy: WFL for tasks assessed/performed Overall Cognitive Status: Within Functional Limits for tasks assessed  Exercises General Exercises - Lower Extremity Long Arc Quad: Both;10 reps Heel Slides: Both;10 reps Hip ABduction/ADduction: Both;10 reps Hip Flexion/Marching: Both;10 reps Heel Raises: Both;10 reps    General Comments        Pertinent Vitals/Pain Pain Assessment: No/denies pain    Home Living                      Prior Function            PT Goals (current goals can now be found in the care plan section) Acute Rehab PT  Goals Patient Stated Goal: Return to prior function at home PT Goal Formulation: With patient Time For Goal Achievement: 09/27/17 Potential to Achieve Goals: Good Progress towards PT goals: Progressing toward goals    Frequency    Min 2X/week      PT Plan Current plan remains appropriate    Co-evaluation              AM-PAC PT "6 Clicks" Daily Activity  Outcome Measure  Difficulty turning over in bed (including adjusting bedclothes, sheets and blankets)?: A Little Difficulty moving from lying on back to sitting on the side of the bed? : A Little Difficulty sitting down on and standing up from a chair with arms (e.g., wheelchair, bedside commode, etc,.)?: A Little Help needed moving to and from a bed to chair (including a wheelchair)?: None Help needed walking in hospital room?: None Help needed climbing 3-5 steps with a railing? : A Little 6 Click Score: 20    End of Session Equipment Utilized During Treatment: Gait belt Activity Tolerance: Patient tolerated treatment well Patient left: in bed;with call bell/phone within reach;with bed alarm set   PT Visit Diagnosis: Unsteadiness on feet (R26.81);Muscle weakness (generalized) (M62.81);Difficulty in walking, not elsewhere classified (R26.2)     Time: [id number] PT Time Calculation (min) (ACUTE ONLY): 13 min  Charges:  $Therapeutic Exercise: 8-22 mins                    G Codes:       Sharalyn InkJason D Arlesia Kiel PT, DPT     [name] 09/15/2017, 4:58 PM

## 2017-09-15 NOTE — Progress Notes (Signed)
New referral for outpatient PALLIATIVE to follow at [facility]. Patient information faxed to referral. Corky CraftsKaren Robertson Rn, BSN, [facility] [place], hospital Liaison [phone]

## 2017-09-16 ENCOUNTER — Telehealth: Payer: Self-pay | Admitting: Family Medicine

## 2017-09-16 DIAGNOSIS — R531 Weakness: Secondary | ICD-10-CM

## 2017-09-16 LAB — BASIC METABOLIC PANEL
Anion gap: 7 (ref 5–15)
BUN: 15 mg/dL (ref 6–20)
CALCIUM: 9.2 mg/dL (ref 8.9–10.3)
CO2: 33 mmol/L — ABNORMAL HIGH (ref 22–32)
CREATININE: 0.85 mg/dL (ref 0.61–1.24)
Chloride: 96 mmol/L — ABNORMAL LOW (ref 101–111)
Glucose, Bld: 99 mg/dL (ref 65–99)
Potassium: 3.3 mmol/L — ABNORMAL LOW (ref 3.5–5.1)
SODIUM: 136 mmol/L (ref 135–145)

## 2017-09-16 MED ORDER — POTASSIUM CHLORIDE CRYS ER 20 MEQ PO TBCR
40.0000 meq | EXTENDED_RELEASE_TABLET | Freq: Once | ORAL | Status: AC
  Administered 2017-09-16: 40 meq via ORAL
  Filled 2017-09-16: qty 2

## 2017-09-16 MED ORDER — SODIUM CHLORIDE 0.9% FLUSH
3.0000 mL | Freq: Two times a day (BID) | INTRAVENOUS | Status: DC
  Administered 2017-09-16: 3 mL via INTRAVENOUS

## 2017-09-16 NOTE — Progress Notes (Signed)
IV and tele removed. Discharge instructions given to patient and family. Family at bedside and will transport patient home. No distress at this time.

## 2017-09-16 NOTE — Telephone Encounter (Signed)
Done

## 2017-09-16 NOTE — Telephone Encounter (Signed)
Copied from CRM [id number]#[id number]. Topic: Quick Communication - See Telephone Encounter >> Sep 16, 2017  9:21 AM Eston Mouldavis, Shrihaan Porzio B wrote: CRM for notification. See Telephone encounter for:  Angelique BlonderDenise from hospice states she will fax over a palliative care form for Dr Sherie DonLada to sign if she is in agreement with the pt moving to palliative care.  Her contact number is [phone]        09/16/17.

## 2017-09-16 NOTE — Discharge Summary (Signed)
[facility] [place] at [facility], [place], [place] y.o., DOB [birth year], MRN [id number]. Admission date: 09/10/2017 Discharge Date 09/16/2017 Primary MD Kerman Passey, MD Admitting Physician Oralia Manis, MD  Admission Diagnosis  Hyponatremia [E87.1] Atrial fibrillation with RVR (HCC) [I48.91] Chest pain, unspecified type [R07.9]  Discharge Diagnosis   Principal Problem:   Acute systolic CHF  Hyponatremia Atrial fibrillation with rapid ventricular rate Coronary artery disease Essential hypertension Hyperlipidemia Hypothyroidism      Hospital Course  Patient is 84 year old presented with chest pain malaise and generalized weakness. Patient's in the ER was noted to have A. fib with RVR and he also was noted to have elevated troponin as well as chf. Patient was started on IV Lasix. Heart grade improved with adjustment of his medications. After receiving diuresis patient's sodium got worse. Therefore nephrology was consulted. And had to receive Trovaptan. Patient received 3 doses of trapped in his sodium normalized. His breathing is back to baseline. I discussed with nephrology regarding use of Lasix and diuretics and all they recommended not to use anything due to dramatic drop in his sodium. Patient's chest x-ray is negative. I recommended him to be careful with fluid and salt intake. Patient also was made DO NOT RESUSCITATE during hospitalization.            Consults  cardiology  Significant Tests:  See full reports for all details     Dg Chest 2 View  Result Date: 09/15/2017 CLINICAL DATA:  Shortness of Breath EXAM: CHEST  2 VIEW COMPARISON:  September 12, 2017 FINDINGS: There is no edema or consolidation. There remains cardiomegaly with pulmonary vascularity currently within normal limits. No adenopathy. There is aortic atherosclerosis. There is calcification in the left anterior descending coronary artery. Bones are osteoporotic. There is degenerative  change in the thoracic spine and in each shoulder. IMPRESSION: Cardiomegaly without edema or consolidation. There is aortic atherosclerosis. There is also calcification in the left anterior descending coronary artery. Bones are osteoporotic. Aortic Atherosclerosis (ICD10-I70.0). Electronically Signed   By: Bretta Bang III M.D.   On: 09/15/2017 09:36   Dg Chest 2 View  Result Date: 09/12/2017 CLINICAL DATA:  Shortness of breath EXAM: CHEST  2 VIEW COMPARISON:  04/08/2016 FINDINGS: There is mild bilateral interstitial prominence. There is no focal parenchymal opacity. There is no pleural effusion or pneumothorax. There is mild stable cardiomegaly. There is thoracic aortic atherosclerosis. 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 IMPRESSION: Cardiomegaly with mild pulmonary vascular congestion. Electronically Signed   By: Elige Ko   On: 09/12/2017 09:54   Dg Chest 2 View  Result Date: 09/10/2017 CLINICAL DATA:  Chest pain EXAM: CHEST  2 VIEW COMPARISON:  04/08/2016, 07/24/2014 FINDINGS: Mildly low lung volumes. Streaky bibasilar atelectasis. Bronchitic changes at the bases. No focal consolidation or pleural effusion cardiomegaly with aortic atherosclerosis. No pneumothorax. Small hiatal hernia. IMPRESSION: Bronchitic changes at the bases with streaky atelectasis. Cardiomegaly. Electronically Signed   By: Jasmine Pang M.D.   On: 09/10/2017 21:55       Today   Subjective:   Kirk Sanchez   feeling much better denies any chest pain or shortness of breath    Blood pressure 136/83, pulse (!) 102, temperature (!) 97.4 F (36.3 C), temperature source Oral, resp. rate 18, height 5\' 8"  (1.727 m), weight 191 lb 8 oz (86.9 kg), SpO2 94 %.  .  Intake/Output Summary (Last 24 hours) at 09/16/2017 1518 Last data filed at 09/16/2017  0144 Gross per 24 hour  Intake -  Output 0 ml  Net 0 ml    Exam VITAL SIGNS: Blood pressure 136/83, pulse (!) 102, temperature (!) 97.4 F  (36.3 C), temperature source Oral, resp. rate 18, height 5\' 8"  (1.727 m), weight 191 lb 8 oz (86.9 kg), SpO2 94 %.  GENERAL:  84 y.o.-year-old patient lying in the bed with no acute distress.  EYES: Pupils equal, round, reactive to light and accommodation. No scleral icterus. Extraocular muscles intact.  HEENT: Head atraumatic, normocephalic. Oropharynx and nasopharynx clear.  NECK:  Supple, no jugular venous distention. No thyroid enlargement, no tenderness.  LUNGS: Normal breath sounds bilaterally, no wheezing, rales,rhonchi or crepitation. No use of accessory muscles of respiration.  CARDIOVASCULAR: S1, S2 normal. No murmurs, rubs, or gallops.  ABDOMEN: Soft, nontender, nondistended. Bowel sounds present. No organomegaly or mass.  EXTREMITIES: No pedal edema, cyanosis, or clubbing.  NEUROLOGIC: Cranial nerves II through XII are intact. Muscle strength 5/5 in all extremities. Sensation intact. Gait not checked.  PSYCHIATRIC: The patient is alert and oriented x 3.  SKIN: No obvious rash, lesion, or ulcer.   Data Review     CBC w Diff:  Lab Results  Component Value Date   WBC 8.0 09/11/2017   HGB 11.7 (L) 09/11/2017   HGB 12.5 (L) 07/24/2014   HCT 35.3 (L) 09/11/2017   HCT 39.5 (L) 07/24/2014   PLT 152 09/11/2017   PLT 216 07/24/2014   LYMPHOPCT 24 03/08/2017   MONOPCT 15.9 09/08/2017   EOSPCT 0.8 09/08/2017   BASOPCT 0.8 09/08/2017   CMP:  Lab Results  Component Value Date   NA 136 09/16/2017   NA 140 06/09/2015   NA 133 (L) 07/24/2014   K 3.3 (L) 09/16/2017   K 4.6 07/24/2014   CL 96 (L) 09/16/2017   CL 98 07/24/2014   CO2 33 (H) 09/16/2017   CO2 26 07/24/2014   BUN 15 09/16/2017   BUN 14 06/09/2015   BUN 16 07/24/2014   CREATININE 0.85 09/16/2017   CREATININE 0.73 09/08/2017   PROT 6.3 09/08/2017   PROT 6.7 06/09/2015   ALBUMIN 3.9 03/08/2017   ALBUMIN 4.1 06/09/2015   BILITOT 0.7 09/08/2017   BILITOT 0.4 06/09/2015   ALKPHOS 68 03/08/2017   AST 20  09/08/2017   ALT 12 09/08/2017  .  Micro Results No results found for this or any previous visit (from the past 240 hour(s)).   Code Status History    Date Active Date Inactive Code Status Order ID Comments User Context   09/14/2017 09:28 09/16/2017 15:05 DNR [id number]  Ramonita LabGouru, Aruna, MD Inpatient   09/11/2017 00:45 09/14/2017 09:28 Full Code [id number]  Oralia ManisWillis, David, MD Inpatient    Questions for Most Recent Historical Code Status (Order [id number])    Question Answer Comment   In the event of cardiac or respiratory ARREST Do not call a "code blue"    In the event of cardiac or respiratory ARREST Do not perform Intubation, CPR, defibrillation or ACLS    In the event of cardiac or respiratory ARREST Use medication by any route, position, wound care, and other measures to relive pain and suffering. May use oxygen, suction and manual treatment of airway obstruction as needed for comfort.    Comments RN may pronounce           Follow-up Information    Lada, Janit BernMelinda P, MD In 2 weeks.   Specialty:  Family Medicine Why:  Follow up with  Dr. Sherie Don on Tue. 10-04-17  @ 2:20pm Contact information: [address] [place] [address] [phone]        Mady Haagensen, MD In 2 weeks.   Specialty:  Internal Medicine Why:  Colbert Coyer up with Dr. Cherylann Ratel on [place] 09-29-17 at 11:00AM Contact information: [address] [place] [address] [phone]           Discharge Medications   Allergies as of 09/16/2017   No Known Allergies     Medication List    TAKE these medications   aspirin 81 MG tablet Take 81 mg by mouth daily.   calcium-vitamin D 500-200 MG-UNIT tablet Take 1 tablet by mouth 2 (two) times daily.   levothyroxine 25 MCG tablet Commonly known as:  SYNTHROID, LEVOTHROID TAKE 1 TABLET BY MOUTH ONCE DAILY ON AN EMPTY STOMACH. WAIT 30 MINUTES BEFORE TAKING OTHER MEDS.   metoprolol succinate 50 MG 24 hr tablet Commonly known as:  TOPROL-XL Take 50  mg by mouth daily.   prednisoLONE acetate 1 % ophthalmic suspension Commonly known as:  PRED FORTE Place 1 drop into both eyes as needed.   ranitidine 150 MG tablet Commonly known as:  ZANTAC Take 1 tablet (150 mg total) by mouth 2 (two) times daily. If needed for heartburn   simvastatin 40 MG tablet Commonly known as:  ZOCOR One by mouth every evening for cholesterol          Total Time in preparing paper work, data evaluation and todays exam - 35 minutes  Auburn Bilberry M.D on 09/16/2017 at 3:18 PM  [facility]   Office  [phone]

## 2017-09-16 NOTE — Plan of Care (Signed)
  Progressing Clinical Measurements: Ability to maintain clinical measurements within normal limits will improve 09/16/2017 0131 - Progressing by Kalman JewelsBallentine, Dalaysia Harms, RN Diagnostic test results will improve 09/16/2017 0131 - Progressing by Kalman JewelsBallentine, Darrian Goodwill, RN Respiratory complications will improve 09/16/2017 0131 - Progressing by Kalman JewelsBallentine, Germany Chelf, RN Cardiovascular complication will be avoided 09/16/2017 0131 - Progressing by Kalman JewelsBallentine, Bobbie Virden, RN Activity: Risk for activity intolerance will decrease 09/16/2017 0131 - Progressing by Kalman JewelsBallentine, Deltha Bernales, RN Pain Managment: General experience of comfort will improve 09/16/2017 0131 - Progressing by Kalman JewelsBallentine, Odaliz Mcqueary, RN Safety: Ability to remain free from injury will improve 09/16/2017 0131 - Progressing by Kalman JewelsBallentine, Laporcha Marchesi, RN Activity: Ability to tolerate increased activity will improve 09/16/2017 0131 - Progressing by Kalman JewelsBallentine, Ellan Tess, RN Cardiac: Ability to achieve and maintain adequate cardiopulmonary perfusion will improve 09/16/2017 0131 - Progressing by Kalman JewelsBallentine, Keziah Drotar, RN

## 2017-09-16 NOTE — Telephone Encounter (Signed)
I personally spoke with hospice team member She says patient's admission diagnosis for palliative care is cardiac [place] Current contact information: Son Kirk Sanchez (same name as patient) [phone]  Okay for palliative care He is not home-bound, so home visit by me not necessary He is not appropriate yet for Hospice services, just palliative care  He has a hospital follow-up appointment already scheduled with me for October 04, 2017

## 2017-09-16 NOTE — Discharge Instructions (Signed)
[facility] - [place] at [facility] ° °DIET:  °Cardiac diet ° °DISCHARGE CONDITION:  °Stable ° °ACTIVITY:  °Activity as tolerated ° °OXYGEN:  °Home Oxygen: No. °  °Oxygen Delivery: room air ° °DISCHARGE LOCATION:  °home  ° ° °ADDITIONAL DISCHARGE INSTRUCTION: ° ° °If you experience worsening of your admission symptoms, develop shortness of breath, life threatening emergency, suicidal or homicidal thoughts you must seek medical attention immediately by calling 911 or calling your MD immediately  if symptoms less severe. ° °You Must read complete instructions/literature along with all the possible adverse reactions/side effects for all the Medicines you take and that have been prescribed to you. Take any new Medicines after you have completely understood and accpet all the possible adverse reactions/side effects.  ° °Please note ° °You were cared for by a hospitalist during your hospital stay. If you have any questions about your discharge medications or the care you received while you were in the hospital after you are discharged, you can call the unit and asked to speak with the hospitalist on call if the hospitalist that took care of you is not available. Once you are discharged, your primary care physician will handle any further medical issues. Please note that NO REFILLS for any discharge medications will be authorized once you are discharged, as it is imperative that you return to your primary care physician (or establish a relationship with a primary care physician if you do not have one) for your aftercare needs so that they can reassess your need for medications and monitor your lab values. ° ° °

## 2017-10-04 ENCOUNTER — Encounter: Payer: Self-pay | Admitting: Family Medicine

## 2017-10-04 ENCOUNTER — Ambulatory Visit (INDEPENDENT_AMBULATORY_CARE_PROVIDER_SITE_OTHER): Payer: Medicare Other | Admitting: Family Medicine

## 2017-10-04 VITALS — BP 122/80 | HR 79 | Temp 97.5°F | Resp 14 | Wt 176.0 lb

## 2017-10-04 DIAGNOSIS — I2510 Atherosclerotic heart disease of native coronary artery without angina pectoris: Secondary | ICD-10-CM | POA: Diagnosis not present

## 2017-10-04 DIAGNOSIS — E876 Hypokalemia: Secondary | ICD-10-CM

## 2017-10-04 DIAGNOSIS — E039 Hypothyroidism, unspecified: Secondary | ICD-10-CM | POA: Diagnosis not present

## 2017-10-04 DIAGNOSIS — R531 Weakness: Secondary | ICD-10-CM | POA: Diagnosis not present

## 2017-10-04 DIAGNOSIS — H9113 Presbycusis, bilateral: Secondary | ICD-10-CM

## 2017-10-04 DIAGNOSIS — D649 Anemia, unspecified: Secondary | ICD-10-CM | POA: Diagnosis not present

## 2017-10-04 DIAGNOSIS — E871 Hypo-osmolality and hyponatremia: Secondary | ICD-10-CM | POA: Diagnosis not present

## 2017-10-04 DIAGNOSIS — I480 Paroxysmal atrial fibrillation: Secondary | ICD-10-CM | POA: Diagnosis not present

## 2017-10-04 NOTE — Progress Notes (Signed)
BP 122/80   Pulse 79   Temp (!) 97.5 F (36.4 C) (Oral)   Resp 14   Wt 176 lb (79.8 kg)   SpO2 99%   BMI 26.76 kg/m    Subjective:    Patient ID: Kirk Sanchez, male    DOB: 04-30-1925, 84 y.o.   MRN: [id number]  HPI: Kirk Sanchez is a 84 y.o. male  Chief Complaint  Patient presents with  . Hospitalization Follow-up  . Atrial Fibrillation    HPI Patient is here for hospital follow-up Atrial fibrillation; no chest pain; not feeling heart skipping beats; he knows that he has an unusual heart beat, 20-30 years; no bleeding His last sodium was better; balance is pretty good; has a walker and walking stick, uses if needed He lives in [place] home; 12 steps; does not need any help at home Low potassium in last labs He has lost weight; not sure if fluid gone; he just hasn't been eating he says; he is pleased with the weight loss and does not think anything untoward is going on, does not think he has cancer, e.g. TSH was normal in Feb 2019 Cholesterol was great in Feb; LDL 48  Depression screen [facility] 2/9 10/04/2017 09/08/2017 03/08/2017 09/08/2016 03/03/2016  Decreased Interest 0 0 0 0 0  Down, Depressed, Hopeless 0 0 0 0 0  PHQ - 2 Score 0 0 0 0 0    Relevant past medical, surgical, family and social history reviewed Past Medical History:  Diagnosis Date  . Atrial fibrillation (HCC)   . Hyperlipidemia   . Hypertension   . Kidney stone   . Presbycusis of both ears 03/06/2016  . Thyroid disease    Past Surgical History:  Procedure Laterality Date  . HAND ARTHROPLASTY    . HIP ARTHROPLASTY    . STENT PLACEMENT RT URETER ([facility] HX)     Family History  Problem Relation Age of Onset  . Cancer Mother        stomach  . Asthma Father   . Stroke Neg Hx   . Heart disease Neg Hx    Social History   Tobacco Use  . Smoking status: Never Smoker  . Smokeless tobacco: Never Used  Substance Use Topics  . Alcohol use: No    Alcohol/week: 0.0 oz  . Drug use: No    Interim  medical history since last visit reviewed. Allergies and medications reviewed  Review of Systems Per HPI unless specifically indicated above     Objective:    BP 122/80   Pulse 79   Temp (!) 97.5 F (36.4 C) (Oral)   Resp 14   Wt 176 lb (79.8 kg)   SpO2 99%   BMI 26.76 kg/m   Wt Readings from Last 3 Encounters:  10/04/17 176 lb (79.8 kg)  09/11/17 191 lb 8 oz (86.9 kg)  09/08/17 191 lb 12.8 oz (87 kg)    Physical Exam  Constitutional: He appears well-developed and well-nourished. No distress.  Weight loss noted  HENT:  Head: Normocephalic and atraumatic.  Right Ear: Decreased hearing is noted.  Left Ear: Decreased hearing is noted.  Mouth/Throat: Mucous membranes are normal.  Very hard of hearing  Eyes: EOM are normal. No scleral icterus.  Neck: No thyromegaly present.  Cardiovascular: Normal rate. An irregularly irregular rhythm present.  Pulmonary/Chest: Effort normal and breath sounds normal.  Abdominal: Soft. Bowel sounds are normal. He exhibits no distension. There is no tenderness.  Musculoskeletal: He  exhibits no edema.       Right shoulder: He exhibits decreased range of motion. He exhibits no tenderness and no bony tenderness.  Neurological: He is alert. He displays no tremor. Coordination normal.  Skin: Skin is warm and dry. No pallor.  Psychiatric: He has a normal mood and affect. His behavior is normal. Judgment and thought content normal. His mood appears not anxious. Cognition and memory are not impaired. He does not exhibit a depressed mood.  Very pleasant and cooperative    Results for orders placed or performed during the hospital encounter of 09/10/17  Basic metabolic panel  Result Value Ref Range   Sodium 124 (L) 135 - 145 mmol/L   Potassium 4.3 3.5 - 5.1 mmol/L   Chloride 87 (L) 101 - 111 mmol/L   CO2 24 22 - 32 mmol/L   Glucose, Bld 129 (H) 65 - 99 mg/dL   BUN 14 6 - 20 mg/dL   Creatinine, Ser 1.61 0.61 - 1.24 mg/dL   Calcium 8.6 (L) 8.9 -  10.3 mg/dL   GFR calc non Af Amer >60 >60 mL/min   GFR calc Af Amer >60 >60 mL/min   Anion gap 13 5 - 15  CBC  Result Value Ref Range   WBC 8.1 3.8 - 10.6 K/uL   RBC 4.31 (L) 4.40 - 5.90 MIL/uL   Hemoglobin 12.5 (L) 13.0 - 18.0 g/dL   HCT 09.6 (L) 04.5 - 40.9 %   MCV 87.2 80.0 - 100.0 fL   MCH 28.9 26.0 - 34.0 pg   MCHC 33.2 32.0 - 36.0 g/dL   RDW 81.1 (H) 91.4 - 78.2 %   Platelets 169 150 - 440 K/uL  Troponin I  Result Value Ref Range   Troponin I 0.05 (HH) <0.03 ng/mL  Urinalysis, Complete w Microscopic  Result Value Ref Range   Color, Urine YELLOW (A) YELLOW   APPearance HAZY (A) CLEAR   Specific Gravity, Urine 1.019 1.005 - 1.030   pH 5.0 5.0 - 8.0   Glucose, UA NEGATIVE NEGATIVE mg/dL   Hgb urine dipstick MODERATE (A) NEGATIVE   Bilirubin Urine NEGATIVE NEGATIVE   Ketones, ur 5 (A) NEGATIVE mg/dL   Protein, ur 956 (A) NEGATIVE mg/dL   Nitrite NEGATIVE NEGATIVE   Leukocytes, UA NEGATIVE NEGATIVE   RBC / HPF 6-30 0 - 5 RBC/hpf   WBC, UA 0-5 0 - 5 WBC/hpf   Bacteria, UA NONE SEEN NONE SEEN   Squamous Epithelial / LPF 0-5 (A) NONE SEEN   Mucus PRESENT   Basic metabolic panel  Result Value Ref Range   Sodium 125 (L) 135 - 145 mmol/L   Potassium 4.2 3.5 - 5.1 mmol/L   Chloride 87 (L) 101 - 111 mmol/L   CO2 26 22 - 32 mmol/L   Glucose, Bld 152 (H) 65 - 99 mg/dL   BUN 12 6 - 20 mg/dL   Creatinine, Ser 2.13 0.61 - 1.24 mg/dL   Calcium 8.4 (L) 8.9 - 10.3 mg/dL   GFR calc non Af Amer >60 >60 mL/min   GFR calc Af Amer >60 >60 mL/min   Anion gap 12 5 - 15  CBC  Result Value Ref Range   WBC 8.0 3.8 - 10.6 K/uL   RBC 4.03 (L) 4.40 - 5.90 MIL/uL   Hemoglobin 11.7 (L) 13.0 - 18.0 g/dL   HCT 08.6 (L) 57.8 - 46.9 %   MCV 87.5 80.0 - 100.0 fL   MCH 29.0 26.0 - 34.0 pg  MCHC 33.1 32.0 - 36.0 g/dL   RDW 78.415.7 (H) 69.611.5 - 29.514.5 %   Platelets 152 150 - 440 K/uL  Troponin I  Result Value Ref Range   Troponin I 0.04 (HH) <0.03 ng/mL  Troponin I  Result Value Ref Range    Troponin I 0.07 (HH) <0.03 ng/mL  Troponin I  Result Value Ref Range   Troponin I 0.08 (HH) <0.03 ng/mL  Basic metabolic panel  Result Value Ref Range   Sodium 124 (L) 135 - 145 mmol/L   Potassium 3.9 3.5 - 5.1 mmol/L   Chloride 84 (L) 101 - 111 mmol/L   CO2 27 22 - 32 mmol/L   Glucose, Bld 114 (H) 65 - 99 mg/dL   BUN 15 6 - 20 mg/dL   Creatinine, Ser 2.840.63 0.61 - 1.24 mg/dL   Calcium 8.5 (L) 8.9 - 10.3 mg/dL   GFR calc non Af Amer >60 >60 mL/min   GFR calc Af Amer >60 >60 mL/min   Anion gap 13 5 - 15  Basic metabolic panel  Result Value Ref Range   Sodium 118 (LL) 135 - 145 mmol/L   Potassium 4.5 3.5 - 5.1 mmol/L   Chloride 81 (L) 101 - 111 mmol/L   CO2 24 22 - 32 mmol/L   Glucose, Bld 107 (H) 65 - 99 mg/dL   BUN 20 6 - 20 mg/dL   Creatinine, Ser 1.320.67 0.61 - 1.24 mg/dL   Calcium 8.5 (L) 8.9 - 10.3 mg/dL   GFR calc non Af Amer >60 >60 mL/min   GFR calc Af Amer >60 >60 mL/min   Anion gap 13 5 - 15  Sodium  Result Value Ref Range   Sodium 117 (LL) 135 - 145 mmol/L  BASELINE basic metabolic panel - IF NOT already drawn  Result Value Ref Range   Sodium 117 (LL) 135 - 145 mmol/L   Potassium 4.3 3.5 - 5.1 mmol/L   Chloride 79 (L) 101 - 111 mmol/L   CO2 25 22 - 32 mmol/L   Glucose, Bld 120 (H) 65 - 99 mg/dL   BUN 21 (H) 6 - 20 mg/dL   Creatinine, Ser 4.400.69 0.61 - 1.24 mg/dL   Calcium 8.5 (L) 8.9 - 10.3 mg/dL   GFR calc non Af Amer >60 >60 mL/min   GFR calc Af Amer >60 >60 mL/min   Anion gap 13 5 - 15  Osmolality, urine  Result Value Ref Range   Osmolality, Ur 115 (L) 300 - 900 mOsm/kg  Sodium, urine, random  Result Value Ref Range   Sodium, Ur <10 mmol/L  Uric acid  Result Value Ref Range   Uric Acid, Serum 5.4 4.4 - 7.6 mg/dL  Cortisol  Result Value Ref Range   Cortisol, Plasma 47.9 ug/dL  TSH  Result Value Ref Range   TSH 2.322 0.350 - 4.500 uIU/mL  Sodium  Result Value Ref Range   Sodium 120 (L) 135 - 145 mmol/L  Basic metabolic panel  Result Value Ref Range    Sodium 120 (L) 135 - 145 mmol/L   Potassium 4.4 3.5 - 5.1 mmol/L   Chloride 84 (L) 101 - 111 mmol/L   CO2 28 22 - 32 mmol/L   Glucose, Bld 103 (H) 65 - 99 mg/dL   BUN 21 (H) 6 - 20 mg/dL   Creatinine, Ser 1.020.67 0.61 - 1.24 mg/dL   Calcium 8.7 (L) 8.9 - 10.3 mg/dL   GFR calc non Af Amer >60 >60 mL/min  GFR calc Af Amer >60 >60 mL/min   Anion gap 8 5 - 15  Basic metabolic panel  Result Value Ref Range   Sodium 133 (L) 135 - 145 mmol/L   Potassium 3.4 (L) 3.5 - 5.1 mmol/L   Chloride 91 (L) 101 - 111 mmol/L   CO2 31 22 - 32 mmol/L   Glucose, Bld 100 (H) 65 - 99 mg/dL   BUN 18 6 - 20 mg/dL   Creatinine, Ser 1.61 0.61 - 1.24 mg/dL   Calcium 8.9 8.9 - 09.6 mg/dL   GFR calc non Af Amer >60 >60 mL/min   GFR calc Af Amer >60 >60 mL/min   Anion gap 11 5 - 15  Basic metabolic panel  Result Value Ref Range   Sodium 136 135 - 145 mmol/L   Potassium 3.3 (L) 3.5 - 5.1 mmol/L   Chloride 96 (L) 101 - 111 mmol/L   CO2 33 (H) 22 - 32 mmol/L   Glucose, Bld 99 65 - 99 mg/dL   BUN 15 6 - 20 mg/dL   Creatinine, Ser 0.45 0.61 - 1.24 mg/dL   Calcium 9.2 8.9 - 40.9 mg/dL   GFR calc non Af Amer >60 >60 mL/min   GFR calc Af Amer >60 >60 mL/min   Anion gap 7 5 - 15  ECHOCARDIOGRAM COMPLETE  Result Value Ref Range   Weight 3,064 oz   Height 68 in   BP 136/96 mmHg   LV PW d 18.5 (A) 0.6 - 1.1 mm   FS 16 (A) 28 - 44 %   LA vol 83.4 mL   LA ID, A-P, ES 48 mm   IVS/LV PW RATIO, ED 1    LA diam index 2.33 cm/m2   LA vol A4C 76.6 ml   LA vol index 40.4 mL/m2   LA diam end sys 48.00 mm   Lateral S' vel 11.20 cm/sec      Assessment & Plan:   Problem List Items Addressed This Visit      Cardiovascular and Mediastinum   Atrial fibrillation (HCC)    Seeing Dr. Juliann Pares; stable, rate controlled        Endocrine   Hypothyroidism    Last TSH normal        Nervous and Auditory   Presbycusis of both ears    Therapists were not able to reach patient; CMA very astutely thinks that perhaps  he could not hear the phone; he thinks they are okay; he goes and has them cleaned        Other   Weakness generalized    Much stronger; he believes he is good at home; no needs      Hyponatremia    Resolved on last labs; check again today      Relevant Orders   Basic metabolic panel   Anemia    Stable in hospital labs       Other Visit Diagnoses    Hypokalemia    -  Primary   Relevant Orders   Basic metabolic panel   Magnesium       Follow up plan: Return in about 1 month (around 11/04/2017) for follow-up visit with Dr. Sherie Don.  An after-visit summary was printed and given to the patient at check-out.  Please see the patient instructions which may contain other information and recommendations beyond what is mentioned above in the assessment and plan.  No orders of the defined types were placed in this encounter.   Orders  Placed This Encounter  Procedures  . Basic metabolic panel  . Magnesium

## 2017-10-04 NOTE — Patient Instructions (Addendum)
We'll get labs today Please do eat three meals a day If you have not heard anything from my staff in a week about any orders/referrals/studies from today, please contact us here to follow-up [phone]

## 2017-10-04 NOTE — Assessment & Plan Note (Signed)
Resolved on last labs; check again today

## 2017-10-04 NOTE — Assessment & Plan Note (Signed)
Stable in hospital labs

## 2017-10-04 NOTE — Assessment & Plan Note (Signed)
Much stronger; he believes he is good at home; no needs

## 2017-10-04 NOTE — Assessment & Plan Note (Signed)
Therapists were not able to reach patient; CMA very astutely thinks that perhaps he could not hear the phone; he thinks they are okay; he goes and has them cleaned

## 2017-10-04 NOTE — Assessment & Plan Note (Signed)
Seeing Dr. Juliann Paresallwood; stable, rate controlled

## 2017-10-04 NOTE — Assessment & Plan Note (Signed)
Last TSH normal.  °

## 2017-10-05 LAB — BASIC METABOLIC PANEL
BUN: 12 mg/dL (ref 7–25)
CHLORIDE: 101 mmol/L (ref 98–110)
CO2: 28 mmol/L (ref 20–32)
CREATININE: 0.79 mg/dL (ref 0.70–1.11)
Calcium: 9.1 mg/dL (ref 8.6–10.3)
Glucose, Bld: 90 mg/dL (ref 65–99)
POTASSIUM: 4.9 mmol/L (ref 3.5–5.3)
Sodium: 137 mmol/L (ref 135–146)

## 2017-10-05 LAB — MAGNESIUM: MAGNESIUM: 1.7 mg/dL (ref 1.5–2.5)

## 2017-10-10 ENCOUNTER — Telehealth: Payer: Self-pay | Admitting: Family Medicine

## 2017-10-10 NOTE — Telephone Encounter (Signed)
Copied from CRM [id number]. Topic: Quick Communication - See Telephone Encounter >> Oct 10, 2017  1:48 PM Louie BunPalacios Medina, Rosey Batheresa D wrote: CRM for notification. See Telephone encounter for: 10/10/17. Dennise from Hospice called and wanted to let Dr. Sherie DonLada know that patient's son refused palliative care for his dad. If she has any questions she can be reached at [phone].

## 2017-10-10 NOTE — Telephone Encounter (Signed)
Thank you :)

## 2017-11-04 ENCOUNTER — Ambulatory Visit (INDEPENDENT_AMBULATORY_CARE_PROVIDER_SITE_OTHER): Payer: Medicare Other | Admitting: Family Medicine

## 2017-11-04 ENCOUNTER — Encounter: Payer: Self-pay | Admitting: Family Medicine

## 2017-11-04 VITALS — BP 118/84 | HR 99 | Temp 98.7°F | Resp 16 | Ht 67.5 in | Wt 182.1 lb

## 2017-11-04 DIAGNOSIS — E032 Hypothyroidism due to medicaments and other exogenous substances: Secondary | ICD-10-CM | POA: Diagnosis not present

## 2017-11-04 DIAGNOSIS — E785 Hyperlipidemia, unspecified: Secondary | ICD-10-CM

## 2017-11-04 DIAGNOSIS — H9113 Presbycusis, bilateral: Secondary | ICD-10-CM | POA: Diagnosis not present

## 2017-11-04 DIAGNOSIS — I2510 Atherosclerotic heart disease of native coronary artery without angina pectoris: Secondary | ICD-10-CM | POA: Diagnosis not present

## 2017-11-04 DIAGNOSIS — I480 Paroxysmal atrial fibrillation: Secondary | ICD-10-CM | POA: Diagnosis not present

## 2017-11-04 DIAGNOSIS — I5023 Acute on chronic systolic (congestive) heart failure: Secondary | ICD-10-CM | POA: Diagnosis not present

## 2017-11-04 DIAGNOSIS — I10 Essential (primary) hypertension: Secondary | ICD-10-CM

## 2017-11-04 MED ORDER — LEVOTHYROXINE SODIUM 25 MCG PO TABS: ORAL_TABLET | ORAL | 1 refills | Status: DC

## 2017-11-04 MED ORDER — MEDICAL COMPRESSION STOCKINGS MISC: 1 refills | Status: DC

## 2017-11-04 MED ORDER — SIMVASTATIN 40 MG PO TABS: ORAL_TABLET | ORAL | 1 refills | Status: DC

## 2017-11-04 MED ORDER — METOPROLOL SUCCINATE ER 50 MG PO TB24: 50.0000 mg | ORAL_TABLET | Freq: Every day | ORAL | 1 refills | Status: DC

## 2017-11-04 NOTE — Assessment & Plan Note (Signed)
Managed by cardiologist; compression stockings

## 2017-11-04 NOTE — Patient Instructions (Signed)
Please do wear the compression stockings on both legs; put them on in the morning and remove at night Do not sleep in the stockings Watch your weight and call if any fluctuations Try to avoid extra salt

## 2017-11-04 NOTE — Progress Notes (Signed)
BP 118/84   Pulse 99   Temp 98.7 F (37.1 C) (Oral)   Resp 16   Ht 5' 7.5" (1.715 m)   Wt 182 lb 1.6 oz (82.6 kg)   SpO2 96%   BMI 28.10 kg/m    Subjective:    Patient ID: Kirk Sanchez, male    DOB: 06-12-1925, 84 y.o.   MRN: [id number]  HPI: Kirk Sanchez is a 84 y.o. male  Chief Complaint  Patient presents with  . Follow-up  . Medication Refill    HPI Patient  Lower back pain States left lower back pain the last few days- thought he slept on it wrong but it has resolved. No pain, tenderness, no dysuria. Walks with a cane, denies falls, lots of stairs- trilevel house.   HTN BP well-controlled today 118/84. Toprol-XL 50mg  denies headaches, blurry vision, slurred speech   Afib  Toprol-XL 50mg ; ASA 81mg  daily no SE. Patient was in hospital in 09/2017 for afib RVR was controllled and discharged. Denies palpitations.   Arteriosclerosis Toprol-XL 50mg ; ASA 81mg  daily   CHF Was in the hospitalization for afib and CHF exacerbation in 09/2017. Denies shob, chest pain. States since he's 43 he can't get out and use the chainsaw but overall feels good.   Hyperlipidemia Simvastatin 40mg  every night. Diet: tatertots, pork, ribs, onions, carrots on the slow cooker. Drinks coffee and unsweetened tea.  Lab Results  Component Value Date   CHOL 129 09/08/2017   HDL 66 09/08/2017   LDLCALC 48 09/08/2017   TRIG 74 09/08/2017   CHOLHDL 2.0 09/08/2017    Hypothyroidism Takes levothyroxine daily denies SE denies changes in bowels Lab Results  Component Value Date   TSH 2.322 09/13/2017    Weight loss Endorses decreased appetite since hospitalization and he decided to "take advantage of it" and lost 25 pounds over the past month- states he feels great; he does not think he has cancer; he's hungry right now; he feels great  Fuch's corneal dystrophy Seeing ophthalmology; everything is going fine  Depression screen [facility] 2/9 11/04/2017 10/04/2017 09/08/2017 03/08/2017 09/08/2016    Decreased Interest 0 0 0 0 0  Down, Depressed, Hopeless 0 0 0 0 0  PHQ - 2 Score 0 0 0 0 0    Relevant past medical, surgical, family and social history reviewed Past Medical History:  Diagnosis Date  . Atrial fibrillation (HCC)   . Hyperlipidemia   . Hypertension   . Kidney stone   . Presbycusis of both ears 03/06/2016  . Thyroid disease    Past Surgical History:  Procedure Laterality Date  . HAND ARTHROPLASTY    . HIP ARTHROPLASTY    . STENT PLACEMENT RT URETER ([facility] HX)     Family History  Problem Relation Age of Onset  . Cancer Mother        stomach  . Asthma Father   . Stroke Neg Hx   . Heart disease Neg Hx    Social History   Tobacco Use  . Smoking status: Never Smoker  . Smokeless tobacco: Never Used  Substance Use Topics  . Alcohol use: No    Alcohol/week: 0.0 oz  . Drug use: No    Interim medical history since last visit reviewed. Allergies and medications reviewed  Review of Systems Per HPI unless specifically indicated above     Objective:    BP 118/84   Pulse 99   Temp 98.7 F (37.1 C) (Oral)   Resp  16   Ht 5' 7.5" (1.715 m)   Wt 182 lb 1.6 oz (82.6 kg)   SpO2 96%   BMI 28.10 kg/m   Wt Readings from Last 3 Encounters:  11/04/17 182 lb 1.6 oz (82.6 kg)  10/04/17 176 lb (79.8 kg)  09/11/17 191 lb 8 oz (86.9 kg)    Physical Exam  Constitutional: He appears well-developed and well-nourished. No distress.  HENT:  Right Ear: Decreased hearing is noted.  Left Ear: Decreased hearing is noted.  Eyes: No scleral icterus.  Neck: No JVD present.  Cardiovascular: Normal rate, regular rhythm and normal heart sounds.  Pulmonary/Chest: Effort normal and breath sounds normal. He has no rales.  Abdominal: Soft. Bowel sounds are normal. He exhibits no distension.  Musculoskeletal: He exhibits edema (sock elastic line; just above line, there is 1+ pitting edema).  Neurological: He is alert.  Skin: No pallor.  Psychiatric: He has a normal mood and  affect.    Results for orders placed or performed in visit on 10/04/17  Basic metabolic panel  Result Value Ref Range   Glucose, Bld 90 65 - 99 mg/dL   BUN 12 7 - 25 mg/dL   Creat 1.190.79 1.470.70 - 8.291.11 mg/dL   BUN/Creatinine Ratio NOT APPLICABLE 6 - 22 (calc)   Sodium 137 135 - 146 mmol/L   Potassium 4.9 3.5 - 5.3 mmol/L   Chloride 101 98 - 110 mmol/L   CO2 28 20 - 32 mmol/L   Calcium 9.1 8.6 - 10.3 mg/dL  Magnesium  Result Value Ref Range   Magnesium 1.7 1.5 - 2.5 mg/dL      Assessment & Plan:   Problem List Items Addressed This Visit      Cardiovascular and Mediastinum   BP (high blood pressure) - Primary   Relevant Medications   metoprolol succinate (TOPROL-XL) 50 MG 24 hr tablet   simvastatin (ZOCOR) 40 MG tablet   Congestive heart failure (HCC)    Recommend compression stockings; managed by heart doctor; avoid salt      Relevant Medications   metoprolol succinate (TOPROL-XL) 50 MG 24 hr tablet   simvastatin (ZOCOR) 40 MG tablet   Atrial fibrillation (HCC)    Managed by cardiologist; compression stockings      Relevant Medications   metoprolol succinate (TOPROL-XL) 50 MG 24 hr tablet   simvastatin (ZOCOR) 40 MG tablet     Endocrine   Hypothyroidism    Modest weight gain, but does not appear related to hypothyroidism      Relevant Medications   levothyroxine (SYNTHROID, LEVOTHROID) 25 MCG tablet   metoprolol succinate (TOPROL-XL) 50 MG 24 hr tablet     Nervous and Auditory   Presbycusis of both ears    He is wearing hearing aides; declined referral to ENT        Other   HLD (hyperlipidemia)   Relevant Medications   metoprolol succinate (TOPROL-XL) 50 MG 24 hr tablet   simvastatin (ZOCOR) 40 MG tablet       Follow up plan: Return in about 3 months (around 02/03/2018) for follow-up visit with Dr. Sherie DonLada.  An after-visit summary was printed and given to the patient at check-out.  Please see the patient instructions which may contain other information and  recommendations beyond what is mentioned above in the assessment and plan.  Meds ordered this encounter  Medications  . levothyroxine (SYNTHROID, LEVOTHROID) 25 MCG tablet    Sig: Once daily for hypothyroidism    Dispense:  90  tablet    Refill:  1    Order Specific Question:   Supervising Provider    Answer:   Yvonnie Schinke, Janit Bern [id number]  . metoprolol succinate (TOPROL-XL) 50 MG 24 hr tablet    Sig: Take 1 tablet (50 mg total) by mouth daily.    Dispense:  90 tablet    Refill:  1    Order Specific Question:   Supervising Provider    Answer:   Alianis Trimmer, Janit Bern [id number]  . simvastatin (ZOCOR) 40 MG tablet    Sig: One by mouth every evening for cholesterol    Dispense:  90 tablet    Refill:  1    Order Specific Question:   Supervising Provider    Answer:   Albana Saperstein, Janit Bern [[id number]]  . Elastic Bandages & Supports (MEDICAL COMPRESSION STOCKINGS) MISC    Sig: Put on both legs in the morning, take off at night; do NOT sleep in them    Dispense:  2 each    Refill:  1    18 mmHg    No orders of the defined types were placed in this encounter.

## 2017-11-04 NOTE — Assessment & Plan Note (Signed)
Recommend compression stockings; managed by heart doctor; avoid salt

## 2017-11-04 NOTE — Assessment & Plan Note (Signed)
He is wearing hearing aides; declined referral to ENT

## 2017-11-04 NOTE — Assessment & Plan Note (Signed)
Modest weight gain, but does not appear related to hypothyroidism

## 2018-02-03 ENCOUNTER — Ambulatory Visit (INDEPENDENT_AMBULATORY_CARE_PROVIDER_SITE_OTHER): Payer: Medicare Other | Admitting: Family Medicine

## 2018-02-03 ENCOUNTER — Encounter: Payer: Self-pay | Admitting: Family Medicine

## 2018-02-03 VITALS — BP 122/78 | HR 87 | Temp 97.7°F | Resp 16 | Ht 68.0 in | Wt 187.1 lb

## 2018-02-03 DIAGNOSIS — I10 Essential (primary) hypertension: Secondary | ICD-10-CM

## 2018-02-03 DIAGNOSIS — G8929 Other chronic pain: Secondary | ICD-10-CM

## 2018-02-03 DIAGNOSIS — M25511 Pain in right shoulder: Secondary | ICD-10-CM

## 2018-02-03 DIAGNOSIS — E782 Mixed hyperlipidemia: Secondary | ICD-10-CM | POA: Diagnosis not present

## 2018-02-03 DIAGNOSIS — M778 Other enthesopathies, not elsewhere classified: Secondary | ICD-10-CM

## 2018-02-03 DIAGNOSIS — I5023 Acute on chronic systolic (congestive) heart failure: Secondary | ICD-10-CM | POA: Diagnosis not present

## 2018-02-03 DIAGNOSIS — M7581 Other shoulder lesions, right shoulder: Secondary | ICD-10-CM | POA: Diagnosis not present

## 2018-02-03 DIAGNOSIS — I480 Paroxysmal atrial fibrillation: Secondary | ICD-10-CM | POA: Diagnosis not present

## 2018-02-03 DIAGNOSIS — I2510 Atherosclerotic heart disease of native coronary artery without angina pectoris: Secondary | ICD-10-CM | POA: Diagnosis not present

## 2018-02-03 DIAGNOSIS — E039 Hypothyroidism, unspecified: Secondary | ICD-10-CM

## 2018-02-03 NOTE — Progress Notes (Signed)
BP 122/78 (BP Location: Left Arm, Patient Position: Sitting, Cuff Size: Large)   Pulse 87   Temp 97.7 F (36.5 C) (Oral)   Resp 16   Ht 5\' 8"  (1.727 m)   Wt 187 lb 1.6 oz (84.9 kg)   SpO2 96%   BMI 28.45 kg/m    Subjective:    Patient ID: Kirk Sanchez, male    DOB: [birth year], 84 y.o.   MRN: [id number]  HPI: Kirk HeadsHoward H Sanchez is a 84 y.o. male  Chief Complaint  Patient presents with  . Follow-up    patient is here for a 3 month f/u  . Hypertension    patient present with no neg sx. patient does not check BP  . Hyperlipidemia    patient stated he's doing fine  . Congestive Heart Failure  . Hypothyroidism  . Atrial Fibrillation  . Back Pain    "no problem"  . Shoulder Pain    patient stated that he has some right shoulder pain at times, cannot lift it above his head all the time    HPI Patient is here for f/u  He has had bursitis in the right shoulder; going on for a year; he is right-handed; cannot lift arm on its own, but can pull it up with left arm; can reach behind him just fine; a little pain with abduction; not using anything; he has seen ortho, Dr. Hyacinth MeekerMiller, in the past  Hypertension; no problems  High cholesterol; does not eat much bacon; does eat a lot of cheese; likes fried foods Lab Results  Component Value Date   CHOL 129 09/08/2017   HDL 66 09/08/2017   LDLCALC 48 09/08/2017   TRIG 74 09/08/2017   CHOLHDL 2.0 09/08/2017   Congestive heart failure; weighs self every day; no weight gain he says, but he is up 5 pounds on our scales; about 165 pounds at home; he sees cardiologist; he also has a-fib; does not feel heart beating fast or funny; tries to avoid salt  Hypothyroidism; he says weight is stable, though our scale shows some gain Lab Results  Component Value Date   TSH 2.322 09/13/2017    [facility] 02/03/2018 11/04/2017 10/04/2017 09/08/2017 03/08/2017  Decreased Interest 0 0 0 0 0  Down, Depressed, Hopeless 0 0 0 0 0  PHQ - 2 Score 0  0 0 0 0    Relevant past medical, surgical, family and social history reviewed Past Medical History:  Diagnosis Date  . Atrial fibrillation (HCC)   . Hyperlipidemia   . Hypertension   . Kidney stone   . Presbycusis of both ears 03/06/2016  . Thyroid disease    Past Surgical History:  Procedure Laterality Date  . HAND ARTHROPLASTY    . HIP ARTHROPLASTY    . STENT PLACEMENT RT URETER ([facility] HX)     Family History  Problem Relation Age of Onset  . Cancer Mother        stomach  . Asthma Father   . Stroke Neg Hx   . Heart disease Neg Hx    Social History   Tobacco Use  . Smoking status: Never Smoker  . Smokeless tobacco: Never Used  Substance Use Topics  . Alcohol use: No    Alcohol/week: 0.0 oz  . Drug use: No    Interim medical history since last visit reviewed. Allergies and medications reviewed  Review of Systems  Constitutional: Negative for unexpected weight change.  Respiratory: Negative for  shortness of breath.   Cardiovascular: Positive for leg swelling (CHF). Negative for chest pain.   Per HPI unless specifically indicated above     Objective:    BP 122/78 (BP Location: Left Arm, Patient Position: Sitting, Cuff Size: Large)   Pulse 87   Temp 97.7 F (36.5 C) (Oral)   Resp 16   Ht 5\' 8"  (1.727 m)   Wt 187 lb 1.6 oz (84.9 kg)   SpO2 96%   BMI 28.45 kg/m   Wt Readings from Last 3 Encounters:  02/03/18 187 lb 1.6 oz (84.9 kg)  11/04/17 182 lb 1.6 oz (82.6 kg)  10/04/17 176 lb (79.8 kg)    Physical Exam  Constitutional: He appears well-developed and well-nourished. No distress.  HENT:  Head: Normocephalic and atraumatic.  Eyes: EOM are normal. No scleral icterus.  Neck: No thyromegaly present.  Cardiovascular: Normal rate and regular rhythm.  Pulmonary/Chest: Effort normal and breath sounds normal.  Abdominal: Soft. Bowel sounds are normal. He exhibits no distension.  Musculoskeletal: He exhibits edema (1+ pitting edema both feet, lower legs).         Right shoulder: He exhibits decreased range of motion and tenderness.  Neurological: Coordination normal.  Skin: Skin is warm and dry. No pallor.  Psychiatric: He has a normal mood and affect. His mood appears not anxious. He does not exhibit a depressed mood.    Results for orders placed or performed in visit on 10/04/17  Basic metabolic panel  Result Value Ref Range   Glucose, Bld 90 65 - 99 mg/dL   BUN 12 7 - 25 mg/dL   Creat 1.61 0.96 - 0.45 mg/dL   BUN/Creatinine Ratio NOT APPLICABLE 6 - 22 (calc)   Sodium 137 135 - 146 mmol/L   Potassium 4.9 3.5 - 5.3 mmol/L   Chloride 101 98 - 110 mmol/L   CO2 28 20 - 32 mmol/L   Calcium 9.1 8.6 - 10.3 mg/dL  Magnesium  Result Value Ref Range   Magnesium 1.7 1.5 - 2.5 mg/dL      Assessment & Plan:   Problem List Items Addressed This Visit      Cardiovascular and Mediastinum   Congestive heart failure (HCC) (Chronic)    Patient to weigh daily; f/u with cardiologist; limit salt      BP (high blood pressure) (Chronic)    controlled      Atrial fibrillation (HCC) (Chronic)    Managed by cardiologist        Endocrine   Hypothyroidism (Chronic)    Monitor TSH yearly, sooner if weight change, tachycardia, etc        Musculoskeletal and Integument   Right shoulder tendonitis - Primary   Relevant Orders   Ambulatory referral to Orthopedic Surgery     Other   HLD (hyperlipidemia) (Chronic)    Discussed dietary recommendations; reviewed last labs       Other Visit Diagnoses    Chronic right shoulder pain       Relevant Orders   Ambulatory referral to Orthopedic Surgery       Follow up plan: Return in about 1 month (around 03/09/2018) for NON-fasting labs only; return to see Dr. Sherie Don in 6 months.  An after-visit summary was printed and given to the patient at check-out.  Please see the patient instructions which may contain other information and recommendations beyond what is mentioned above in the assessment and  plan.  No orders of the defined types were placed in this  encounter.   Orders Placed This Encounter  Procedures  . Ambulatory referral to Orthopedic Surgery

## 2018-02-03 NOTE — Patient Instructions (Addendum)
Try to follow the DASH guidelines (DASH stands for Dietary Approaches to Stop Hypertension). Try to limit the sodium in your diet to no more than 1,500mg  of sodium per day. Certainly try to not exceed 2,000 mg per day at the very most. Do not add salt when cooking or at the table.  Check the sodium amount on labels when shopping, and choose items lower in sodium when given a choice. Avoid or limit foods that already contain a lot of sodium. Eat a diet rich in fruits and vegetables and whole grains, and try to lose weight if overweight or obese   Heart Failure Heart failure means your heart has trouble pumping blood. This makes it hard for your body to work well. Heart failure is usually a long-term (chronic) condition. You must take good care of yourself and follow your doctor's treatment plan. Follow these instructions at home:  Take your heart medicine as told by your doctor. ? Do not stop taking medicine unless your doctor tells you to. ? Do not skip any dose of medicine. ? Refill your medicines before they run out. ? Take other medicines only as told by your doctor or pharmacist.  Stay active if told by your doctor. The elderly and people with severe heart failure should talk with a doctor about physical activity.  Eat heart-healthy foods. Choose foods that are without trans fat and are low in saturated fat, cholesterol, and salt (sodium). This includes fresh or frozen fruits and vegetables, fish, lean meats, fat-free or low-fat dairy foods, whole grains, and high-fiber foods. Lentils and dried peas and beans (legumes) are also good choices.  Limit salt if told by your doctor.  Cook in a healthy way. Roast, grill, broil, bake, poach, steam, or stir-fry foods.  Limit fluids as told by your doctor.  Weigh yourself every morning. Do this after you pee (urinate) and before you eat breakfast. Write down your weight to give to your doctor.  Take your blood pressure and write it down if your  doctor tells you to.  Ask your doctor how to check your pulse. Check your pulse as told.  Lose weight if told by your doctor.  Stop smoking or chewing tobacco. Do not use gum or patches that help you quit without your doctor's approval.  Schedule and go to doctor visits as told.  Nonpregnant women should have no more than 1 drink a day. Men should have no more than 2 drinks a day. Talk to your doctor about drinking alcohol.  Stop illegal drug use.  Stay current with shots (immunizations).  Manage your health conditions as told by your doctor.  Learn to manage your stress.  Rest when you are tired.  If it is really hot outside: ? Avoid intense activities. ? Use air conditioning or fans, or get in a cooler place. ? Avoid caffeine and alcohol. ? Wear loose-fitting, lightweight, and light-colored clothing.  If it is really cold outside: ? Avoid intense activities. ? Layer your clothing. ? Wear mittens or gloves, a hat, and a scarf when going outside. ? Avoid alcohol.  Learn about heart failure and get support as needed.  Get help to maintain or improve your quality of life and your ability to care for yourself as needed. Contact a doctor if:  You gain weight quickly.  You are more short of breath than usual.  You cannot do your normal activities.  You tire easily.  You cough more than normal, especially with activity.  You have any or more puffiness (swelling) in areas such as your hands, feet, ankles, or belly (abdomen).  You cannot sleep because it is hard to breathe.  You feel like your heart is beating fast (palpitations).  You get dizzy or light-headed when you stand up. Get help right away if:  You have trouble breathing.  There is a change in mental status, such as becoming less alert or not being able to focus.  You have chest pain or discomfort.  You faint. This information is not intended to replace advice given to you by your health care  provider. Make sure you discuss any questions you have with your health care provider. Document Released: 04/27/2008 Document Revised: 12/25/2015 Document Reviewed: 09/04/2012 Elsevier Interactive Patient Education  2017 [employer]

## 2018-02-08 NOTE — Assessment & Plan Note (Signed)
Managed by cardiologist 

## 2018-02-08 NOTE — Assessment & Plan Note (Signed)
Discussed dietary recommendations; reviewed last labs

## 2018-02-08 NOTE — Assessment & Plan Note (Signed)
Monitor TSH yearly, sooner if weight change, tachycardia, etc

## 2018-02-08 NOTE — Assessment & Plan Note (Signed)
Patient to weigh daily; f/u with cardiologist; limit salt

## 2018-02-08 NOTE — Assessment & Plan Note (Signed)
controlled 

## 2018-03-06 ENCOUNTER — Other Ambulatory Visit: Payer: Self-pay | Admitting: Family Medicine

## 2018-03-06 ENCOUNTER — Other Ambulatory Visit: Payer: Self-pay

## 2018-03-06 DIAGNOSIS — E039 Hypothyroidism, unspecified: Secondary | ICD-10-CM

## 2018-03-06 DIAGNOSIS — D649 Anemia, unspecified: Secondary | ICD-10-CM

## 2018-03-06 DIAGNOSIS — E782 Mixed hyperlipidemia: Secondary | ICD-10-CM | POA: Diagnosis not present

## 2018-03-06 DIAGNOSIS — Z5181 Encounter for therapeutic drug level monitoring: Secondary | ICD-10-CM | POA: Diagnosis not present

## 2018-03-06 LAB — CBC WITH DIFFERENTIAL/PLATELET
BASOS ABS: 32 {cells}/uL (ref 0–200)
Basophils Relative: 0.6 %
EOS PCT: 3 %
Eosinophils Absolute: 162 cells/uL (ref 15–500)
HCT: 37.5 % — ABNORMAL LOW (ref 38.5–50.0)
Hemoglobin: 12.1 g/dL — ABNORMAL LOW (ref 13.2–17.1)
Lymphs Abs: 1453 cells/uL (ref 850–3900)
MCH: 28.7 pg (ref 27.0–33.0)
MCHC: 32.3 g/dL (ref 32.0–36.0)
MCV: 89.1 fL (ref 80.0–100.0)
MPV: 11.1 fL (ref 7.5–12.5)
Monocytes Relative: 10.7 %
NEUTROS PCT: 58.8 %
Neutro Abs: 3175 cells/uL (ref 1500–7800)
PLATELETS: 179 10*3/uL (ref 140–400)
RBC: 4.21 10*6/uL (ref 4.20–5.80)
RDW: 13.6 % (ref 11.0–15.0)
TOTAL LYMPHOCYTE: 26.9 %
WBC mixed population: 578 cells/uL (ref 200–950)
WBC: 5.4 10*3/uL (ref 3.8–10.8)

## 2018-03-06 LAB — LIPID PANEL
CHOL/HDL RATIO: 2.6 (calc) (ref <5.0)
Cholesterol: 170 mg/dL (ref <200)
HDL: 66 mg/dL (ref >40)
LDL CHOLESTEROL (CALC): 77 mg/dL
NON-HDL CHOLESTEROL (CALC): 104 mg/dL (ref <130)
Triglycerides: 173 mg/dL — ABNORMAL HIGH (ref <150)

## 2018-03-06 LAB — COMPLETE METABOLIC PANEL WITH GFR
AG RATIO: 1.5 (calc) (ref 1.0–2.5)
ALKALINE PHOSPHATASE (APISO): 68 U/L (ref 40–115)
ALT: 9 U/L (ref 9–46)
AST: 16 U/L (ref 10–35)
Albumin: 4 g/dL (ref 3.6–5.1)
BUN: 16 mg/dL (ref 7–25)
CO2: 29 mmol/L (ref 20–32)
Calcium: 9.6 mg/dL (ref 8.6–10.3)
Chloride: 100 mmol/L (ref 98–110)
Creat: 0.81 mg/dL (ref 0.70–1.11)
GFR, Est African American: 91 mL/min/{1.73_m2} (ref >=60)
GFR, Est Non African American: 78 mL/min/{1.73_m2} (ref >=60)
Globulin: 2.7 g/dL (calc) (ref 1.9–3.7)
Glucose, Bld: 92 mg/dL (ref 65–139)
POTASSIUM: 4.6 mmol/L (ref 3.5–5.3)
SODIUM: 137 mmol/L (ref 135–146)
Total Bilirubin: 0.5 mg/dL (ref 0.2–1.2)
Total Protein: 6.7 g/dL (ref 6.1–8.1)

## 2018-03-06 LAB — TSH: TSH: 3.44 mIU/L (ref 0.40–4.50)

## 2018-03-06 NOTE — Progress Notes (Signed)
Orders entered

## 2018-06-02 ENCOUNTER — Other Ambulatory Visit: Payer: Self-pay | Admitting: Family Medicine

## 2018-06-02 DIAGNOSIS — I10 Essential (primary) hypertension: Secondary | ICD-10-CM

## 2018-07-20 ENCOUNTER — Other Ambulatory Visit: Payer: Self-pay | Admitting: Family Medicine

## 2018-07-20 DIAGNOSIS — E032 Hypothyroidism due to medicaments and other exogenous substances: Secondary | ICD-10-CM

## 2018-07-20 NOTE — Telephone Encounter (Signed)
Lab Results  Component Value Date   TSH 3.44 03/06/2018

## 2018-08-08 ENCOUNTER — Ambulatory Visit (INDEPENDENT_AMBULATORY_CARE_PROVIDER_SITE_OTHER): Payer: Medicare Other | Admitting: Family Medicine

## 2018-08-08 ENCOUNTER — Encounter: Payer: Self-pay | Admitting: Family Medicine

## 2018-08-08 VITALS — BP 124/62 | HR 74 | Temp 97.3°F | Ht 68.0 in | Wt 192.8 lb

## 2018-08-08 DIAGNOSIS — E039 Hypothyroidism, unspecified: Secondary | ICD-10-CM

## 2018-08-08 DIAGNOSIS — D649 Anemia, unspecified: Secondary | ICD-10-CM | POA: Diagnosis not present

## 2018-08-08 DIAGNOSIS — R011 Cardiac murmur, unspecified: Secondary | ICD-10-CM | POA: Diagnosis not present

## 2018-08-08 DIAGNOSIS — I2510 Atherosclerotic heart disease of native coronary artery without angina pectoris: Secondary | ICD-10-CM

## 2018-08-08 DIAGNOSIS — Z23 Encounter for immunization: Secondary | ICD-10-CM | POA: Diagnosis not present

## 2018-08-08 DIAGNOSIS — I5023 Acute on chronic systolic (congestive) heart failure: Secondary | ICD-10-CM

## 2018-08-08 DIAGNOSIS — Z955 Presence of coronary angioplasty implant and graft: Secondary | ICD-10-CM

## 2018-08-08 DIAGNOSIS — E871 Hypo-osmolality and hyponatremia: Secondary | ICD-10-CM | POA: Diagnosis not present

## 2018-08-08 NOTE — Progress Notes (Signed)
BP 124/62   Pulse 74   Temp (!) 97.3 F (36.3 C)   Ht 5\' 8"  (1.727 m)   Wt 192 lb 12.8 oz (87.5 kg)   SpO2 95%   BMI 29.32 kg/m    Subjective:    Patient ID: Kirk HeadsHoward H Mukherjee, male    DOB: 02/02/1925, 85 y.o.   MRN: [id number]  HPI: Kirk Sanchez is a 85 y.o. male  Chief Complaint  Patient presents with  . Follow-up    HPI Here for f/u Nothing bothering him HTN; taking medicine daily; loves salt though Hypercholesterolemia; on statin Hypothyroidism; some constipation; using medicine to help move things Stent years ago; he used to take a blood thinner; has hx of heart murmur, CHF (does eat salt), denies leg edema No chest pain, no abdominal pain Weight gain; he's been "enjoying life"; weather has been bad so he hasn't been getting out; he's eating "anything I want"  [facility] 2/[facility] 08/08/2018 02/03/2018 11/04/2017 10/04/2017 09/08/2017  Decreased Interest 0 0 0 0 0  Down, Depressed, Hopeless 0 0 0 0 0  PHQ - 2 Score 0 0 0 0 0  Altered sleeping 0 - - - -  Tired, decreased energy 0 - - - -  Change in appetite 0 - - - -  Feeling bad or failure about yourself  0 - - - -  Trouble concentrating 0 - - - -  Moving slowly or fidgety/restless 0 - - - -  PHQ-9 Score 0 - - - -  Difficult doing work/chores Not difficult at all - - - -   Fall Risk  08/08/2018 02/03/2018 11/04/2017 10/04/2017 09/08/2017  Falls in the past year? 0 No Yes No No  Number falls in past yr: - - 2 or more - -  Injury with Fall? - - No - -    Relevant past medical, surgical, family and social history reviewed Past Medical History:  Diagnosis Date  . Atrial fibrillation (HCC)   . Hyperlipidemia   . Hypertension   . Kidney stone   . Presbycusis of both ears 03/06/2016  . Thyroid disease    Past Surgical History:  Procedure Laterality Date  . HAND ARTHROPLASTY    . HIP ARTHROPLASTY    . STENT PLACEMENT RT URETER ([facility] HX)     Family History  Problem Relation Age of Onset  . Cancer Mother    stomach  . Asthma Father   . Stroke Neg Hx   . Heart disease Neg Hx    Social History   Tobacco Use  . Smoking status: Never Smoker  . Smokeless tobacco: Never Used  Substance Use Topics  . Alcohol use: No    Alcohol/week: 0.0 standard drinks  . Drug use: No     [facility]  AUDIT-C Score  0      Interim medical history since last visit reviewed. Allergies and medications reviewed  Review of Systems  Gastrointestinal: Negative for blood in stool.  Genitourinary: Negative for hematuria.   Per HPI unless specifically indicated above     Objective:    BP 124/62   Pulse 74   Temp (!) 97.3 F (36.3 C)   Ht 5\' 8"  (1.727 m)   Wt 192 lb 12.8 oz (87.5 kg)   SpO2 95%   BMI 29.32 kg/m   Wt Readings from Last 3 Encounters:  08/08/18 192 lb 12.8 oz (87.5 kg)  02/03/18 187  lb 1.6 oz (84.9 kg)  11/04/17 182 lb 1.6 oz (82.6 kg)    Physical Exam Constitutional:      General: He is not in acute distress.    Appearance: He is well-developed.  HENT:     Head: Normocephalic and atraumatic.     Right Ear: Decreased hearing noted.     Left Ear: Decreased hearing noted.  Eyes:     General: No scleral icterus. Neck:     Thyroid: No thyromegaly.  Cardiovascular:     Rate and Rhythm: Normal rate and regular rhythm.  Pulmonary:     Effort: Pulmonary effort is normal.     Breath sounds: Normal breath sounds.  Abdominal:     General: Bowel sounds are normal. There is no distension.     Palpations: Abdomen is soft.  Musculoskeletal:     Right lower leg: Edema (1+, sock line evident) present.     Left lower leg: Edema (1+, sock line evident) present.  Skin:    General: Skin is warm and dry.     Coloration: Skin is not pale.  Neurological:     Coordination: Coordination normal.  Psychiatric:        Behavior: Behavior normal.        Thought Content: Thought content normal.        Judgment: Judgment normal.     Results  for orders placed or performed in visit on 03/06/18  TSH  Result Value Ref Range   TSH 3.44 0.40 - 4.50 mIU/L  Lipid panel  Result Value Ref Range   Cholesterol 170 <200 mg/dL   HDL 66 >78>40 mg/dL   Triglycerides 295173 (H) <150 mg/dL   LDL Cholesterol (Calc) 77 mg/dL (calc)   Total CHOL/HDL Ratio 2.6 <5.0 (calc)   Non-HDL Cholesterol (Calc) 104 <130 mg/dL (calc)  CBC with Differential/Platelet  Result Value Ref Range   WBC 5.4 3.8 - 10.8 Thousand/uL   RBC 4.21 4.20 - 5.80 Million/uL   Hemoglobin 12.1 (L) 13.2 - 17.1 g/dL   HCT 62.137.5 (L) 30.838.5 - 65.750.0 %   MCV 89.1 80.0 - 100.0 fL   MCH 28.7 27.0 - 33.0 pg   MCHC 32.3 32.0 - 36.0 g/dL   RDW 84.613.6 96.211.0 - 95.215.0 %   Platelets 179 140 - 400 Thousand/uL   MPV 11.1 7.5 - 12.5 fL   Neutro Abs 3,175 1,500 - 7,800 cells/uL   Lymphs Abs 1,453 850 - 3,900 cells/uL   WBC mixed population 578 200 - 950 cells/uL   Eosinophils Absolute 162 15 - 500 cells/uL   Basophils Absolute 32 0 - 200 cells/uL   Neutrophils Relative % 58.8 %   Total Lymphocyte 26.9 %   Monocytes Relative 10.7 %   Eosinophils Relative 3.0 %   Basophils Relative 0.6 %  COMPLETE METABOLIC PANEL WITH GFR  Result Value Ref Range   Glucose, Bld 92 65 - 139 mg/dL   BUN 16 7 - 25 mg/dL   Creat 8.410.81 3.240.70 - 4.011.11 mg/dL   GFR, Est Non African American 78 > OR = 60 mL/min/1.1173m2   GFR, Est African American 91 > OR = 60 mL/min/1.2773m2   BUN/Creatinine Ratio NOT APPLICABLE 6 - 22 (calc)   Sodium 137 135 - 146 mmol/L   Potassium 4.6 3.5 - 5.3 mmol/L   Chloride 100 98 - 110 mmol/L   CO2 29 20 - 32 mmol/L   Calcium 9.6 8.6 - 10.3 mg/dL   Total Protein 6.7 6.1 -  8.1 g/dL   Albumin 4.0 3.6 - 5.1 g/dL   Globulin 2.7 1.9 - 3.7 g/dL (calc)   AG Ratio 1.5 1.0 - 2.5 (calc)   Total Bilirubin 0.5 0.2 - 1.2 mg/dL   Alkaline phosphatase (APISO) 68 40 - 115 U/L   AST 16 10 - 35 U/L   ALT 9 9 - 46 U/L      Assessment & Plan:   Problem List Items Addressed This Visit      Cardiovascular and  Mediastinum   Arteriosclerosis of coronary artery - Primary   Relevant Orders   Ambulatory referral to Cardiology   Lipid panel   Congestive heart failure (HCC) (Chronic)     Endocrine   Hypothyroidism (Chronic)   Relevant Orders   TSH     Other   Cardiac murmur   Relevant Orders   Ambulatory referral to Cardiology   Hyponatremia   Relevant Orders   BASIC METABOLIC PANEL WITH GFR   Anemia   Relevant Orders   CBC with Differential/Platelet   Iron, TIBC and Ferritin Panel   Retic    Other Visit Diagnoses    History of coronary artery stent placement       Relevant Orders   Ambulatory referral to Cardiology   Lipid panel   Need for influenza vaccination       Relevant Orders   Flu vaccine HIGH DOSE PF (Fluzone High dose) (Completed)       Follow up plan: Return in about 6 months (around 02/06/2019) for follow-up visit with Dr. Sherie Don.  An after-visit summary was printed and given to the patient at check-out.  Please see the patient instructions which may contain other information and recommendations beyond what is mentioned above in the assessment and plan.  No orders of the defined types were placed in this encounter.   Orders Placed This Encounter  Procedures  . Flu vaccine HIGH DOSE PF (Fluzone High dose)  . Lipid panel  . BASIC METABOLIC PANEL WITH GFR  . TSH  . CBC with Differential/Platelet  . Iron, TIBC and Ferritin Panel  . Retic  . Ambulatory referral to Cardiology

## 2018-08-08 NOTE — Patient Instructions (Signed)
Let's get labs today We'll have you see Dr. Juliann Pares soon Try to limit salt Weigh yourself daily Call Dr. Juliann Pares if you gain two or more pounds overnight or if you gain five or more pounds in a week

## 2018-08-09 LAB — LIPID PANEL
Cholesterol: 171 mg/dL (ref <200)
HDL: 64 mg/dL (ref >40)
LDL Cholesterol (Calc): 86 mg/dL (calc)
NON-HDL CHOLESTEROL (CALC): 107 mg/dL (ref <130)
TRIGLYCERIDES: 113 mg/dL (ref <150)
Total CHOL/HDL Ratio: 2.7 (calc) (ref <5.0)

## 2018-08-09 LAB — IRON,TIBC AND FERRITIN PANEL
%SAT: 21 % (ref 20–48)
Ferritin: 118 ng/mL (ref 24–380)
Iron: 60 ug/dL (ref 50–180)
TIBC: 283 ug/dL (ref 250–425)

## 2018-08-09 LAB — CBC WITH DIFFERENTIAL/PLATELET
Absolute Monocytes: 605 cells/uL (ref 200–950)
BASOS ABS: 32 {cells}/uL (ref 0–200)
Basophils Relative: 0.5 %
Eosinophils Absolute: 221 cells/uL (ref 15–500)
Eosinophils Relative: 3.5 %
HEMATOCRIT: 37 % — AB (ref 38.5–50.0)
Hemoglobin: 12.2 g/dL — ABNORMAL LOW (ref 13.2–17.1)
LYMPHS ABS: 1342 {cells}/uL (ref 850–3900)
MCH: 29.5 pg (ref 27.0–33.0)
MCHC: 33 g/dL (ref 32.0–36.0)
MCV: 89.6 fL (ref 80.0–100.0)
MONOS PCT: 9.6 %
MPV: 10.8 fL (ref 7.5–12.5)
NEUTROS PCT: 65.1 %
Neutro Abs: 4101 cells/uL (ref 1500–7800)
PLATELETS: 197 10*3/uL (ref 140–400)
RBC: 4.13 10*6/uL — ABNORMAL LOW (ref 4.20–5.80)
RDW: 13.1 % (ref 11.0–15.0)
TOTAL LYMPHOCYTE: 21.3 %
WBC: 6.3 10*3/uL (ref 3.8–10.8)

## 2018-08-09 LAB — BASIC METABOLIC PANEL WITH GFR
BUN: 15 mg/dL (ref 7–25)
CO2: 32 mmol/L (ref 20–32)
CREATININE: 0.76 mg/dL (ref 0.70–1.11)
Calcium: 9.3 mg/dL (ref 8.6–10.3)
Chloride: 101 mmol/L (ref 98–110)
GFR, EST AFRICAN AMERICAN: 92 mL/min/{1.73_m2} (ref >=60)
GFR, Est Non African American: 80 mL/min/{1.73_m2} (ref >=60)
Glucose, Bld: 85 mg/dL (ref 65–99)
Potassium: 4.8 mmol/L (ref 3.5–5.3)
Sodium: 138 mmol/L (ref 135–146)

## 2018-08-09 LAB — TSH: TSH: 3.19 m[IU]/L (ref 0.40–4.50)

## 2018-08-09 LAB — RETICULOCYTES
ABS RETIC: 53690 {cells}/uL (ref 25000–9000)
RETIC CT PCT: 1.3 %

## 2018-08-17 ENCOUNTER — Other Ambulatory Visit: Payer: Self-pay | Admitting: Family Medicine

## 2018-08-17 DIAGNOSIS — E6609 Other obesity due to excess calories: Secondary | ICD-10-CM | POA: Diagnosis not present

## 2018-08-17 DIAGNOSIS — I25118 Atherosclerotic heart disease of native coronary artery with other forms of angina pectoris: Secondary | ICD-10-CM | POA: Diagnosis not present

## 2018-08-17 DIAGNOSIS — E785 Hyperlipidemia, unspecified: Secondary | ICD-10-CM

## 2018-08-17 DIAGNOSIS — I10 Essential (primary) hypertension: Secondary | ICD-10-CM | POA: Diagnosis not present

## 2018-08-17 DIAGNOSIS — I5032 Chronic diastolic (congestive) heart failure: Secondary | ICD-10-CM | POA: Diagnosis not present

## 2018-08-17 DIAGNOSIS — I429 Cardiomyopathy, unspecified: Secondary | ICD-10-CM | POA: Diagnosis not present

## 2018-08-17 DIAGNOSIS — Z6830 Body mass index (BMI) 30.0-30.9, adult: Secondary | ICD-10-CM | POA: Diagnosis not present

## 2018-08-17 DIAGNOSIS — E079 Disorder of thyroid, unspecified: Secondary | ICD-10-CM | POA: Diagnosis not present

## 2018-08-17 DIAGNOSIS — R011 Cardiac murmur, unspecified: Secondary | ICD-10-CM | POA: Diagnosis not present

## 2018-08-17 DIAGNOSIS — E7849 Other hyperlipidemia: Secondary | ICD-10-CM | POA: Diagnosis not present

## 2018-08-17 NOTE — Telephone Encounter (Signed)
Lab Results  Component Value Date   ALT 9 03/06/2018   Lab Results  Component Value Date   CHOL 171 08/08/2018   HDL 64 08/08/2018   LDLCALC 86 08/08/2018   TRIG 113 08/08/2018   CHOLHDL 2.7 08/08/2018

## 2018-10-06 ENCOUNTER — Ambulatory Visit (INDEPENDENT_AMBULATORY_CARE_PROVIDER_SITE_OTHER): Payer: Medicare Other

## 2018-10-06 VITALS — BP 122/72 | HR 61 | Temp 97.4°F | Ht 68.0 in | Wt 191.1 lb

## 2018-10-06 DIAGNOSIS — Z0000 Encounter for general adult medical examination without abnormal findings: Secondary | ICD-10-CM

## 2018-10-06 NOTE — Progress Notes (Signed)
Subjective:   Kirk Sanchez is a 85 y.o. male who presents for Medicare Annual/Subsequent preventive examination.  Review of Systems:   Cardiac Risk Factors include: advanced age (>23men, >35 women);dyslipidemia;hypertension;male gender     Objective:    Vitals: BP 122/72 (BP Location: Left Arm, Patient Position: Sitting, Cuff Size: Normal)   Pulse 61   Temp (!) 97.4 F (36.3 C) (Oral)   Ht 5\' 8"  (1.727 m)   Wt 191 lb 1.6 oz (86.7 kg)   BMI 29.06 kg/m   Body mass index is 29.06 kg/m.  Advanced Directives 10/06/2018 09/10/2017 03/08/2017 09/08/2016 04/08/2016 03/03/2016 06/09/2015  Does Patient Have a Medical Advance Directive? Yes No Yes Yes No Yes No  Type of Advance Directive Living will;Healthcare Power of Attorney - - Living will;Healthcare Power of Attorney - Living will -  Copy of Healthcare Power of Attorney in Chart? No - copy requested - - - - No - copy requested -  Would patient like information on creating a medical advance directive? - No - Patient declined - - - - -    Tobacco Social History   Tobacco Use  Smoking Status Never Smoker  Smokeless Tobacco Never Used     Counseling given: Not Answered   Clinical Intake:  Pre-visit preparation completed: Yes  Pain : No/denies pain     BMI - recorded: 29.06 Nutritional Status: BMI 25 -29 Overweight Nutritional Risks: None Diabetes: No  How often do you need to have someone help you when you read instructions, pamphlets, or other written materials from your doctor or pharmacy?: 1 - Never  Interpreter Needed?: No  Information entered by :: Reather Littler LPN  Past Medical History:  Diagnosis Date  . Atrial fibrillation (HCC)   . Hyperlipidemia   . Hypertension   . Kidney stone   . Presbycusis of both ears 03/06/2016  . Thyroid disease    Past Surgical History:  Procedure Laterality Date  . HAND ARTHROPLASTY    . HIP ARTHROPLASTY    . STENT PLACEMENT RT URETER ([facility] HX)     Family History  Problem  Relation Age of Onset  . Cancer Mother        stomach  . Asthma Father   . Stroke Neg Hx   . Heart disease Neg Hx    Social History   Socioeconomic History  . Marital status: Widowed    Spouse name: Not on file  . Number of children: 3  . Years of education: Not on file  . Highest education level: High school graduate  Occupational History  . Occupation: Retired  Engineer, production  . Financial resource strain: Not hard at all  . Food insecurity:    Worry: Never true    Inability: Never true  . Transportation needs:    Medical: No    Non-medical: No  Tobacco Use  . Smoking status: Never Smoker  . Smokeless tobacco: Never Used  Substance and Sexual Activity  . Alcohol use: No    Alcohol/week: 0.0 standard drinks  . Drug use: No  . Sexual activity: Not Currently  Lifestyle  . Physical activity:    Days per week: 0 days    Minutes per session: Not on file  . Stress: Not at all  Relationships  . Social connections:    Talks on phone: More than three times a week    Gets together: More than three times a week    Attends religious service: More than 4  times per year    Active member of club or organization: No    Attends meetings of clubs or organizations: Never    Relationship status: Widowed  Other Topics Concern  . Not on file  Social History Narrative  . Not on file    Outpatient Encounter Medications as of 10/06/2018  Medication Sig  . aspirin 81 MG tablet Take 81 mg by mouth daily.  . Calcium Carb-Cholecalciferol (CALCIUM-VITAMIN D) 500-200 MG-UNIT tablet Take 1 tablet by mouth 2 (two) times daily.   Marland Kitchen levothyroxine (SYNTHROID, LEVOTHROID) 25 MCG tablet TAKE 1 TABLET BY MOUTH ONCE DAILY ON AN EMPTY STOMACH. WAIT 30 MINUTES BEFORE TAKING OTHER MEDS.  . metoprolol succinate (TOPROL-XL) 50 MG 24 hr tablet TAKE 1 TABLET BY MOUTH ONCE DAILY.  . simvastatin (ZOCOR) 40 MG tablet TAKE 1 TABLET BY MOUTH ONCE EVERY EVENING FOR CHOLESTEROL.  Clinical research associate Bandages & Supports  (MEDICAL COMPRESSION STOCKINGS) MISC Put on both legs in the morning, take off at night; do NOT sleep in them (Patient not taking: Reported on 10/06/2018)  . [DISCONTINUED] prednisoLONE acetate (PRED FORTE) 1 % ophthalmic suspension Place 1 drop into both eyes as needed.    No facility-administered encounter medications on file as of 10/06/2018.     Activities of Daily Living In your present state of health, do you have any difficulty performing the following activities: 10/06/2018 08/08/2018  Hearing? Malvin Johns  Comment wears hearing aids -  Vision? N N  Comment wears glasses -  Difficulty concentrating or making decisions? N N  Walking or climbing stairs? Y N  Comment slowly -  Dressing or bathing? N N  Doing errands, shopping? N N  Preparing Food and eating ? N -  Using the Toilet? N -  In the past six months, have you accidently leaked urine? N -  Do you have problems with loss of bowel control? N -  Managing your Medications? N -  Managing your Finances? N -  Housekeeping or managing your Housekeeping? N -  Some recent data might be hidden    Patient Care Team: Lada, Janit Bern, MD as PCP - General (Family Medicine) Alwyn Pea, MD as Consulting Physician (Cardiology)   Assessment:   This is a routine wellness examination for [facility].  Exercise Activities and Dietary recommendations Current Exercise Habits: The patient does not participate in regular exercise at present, Exercise limited by: orthopedic condition(s)  Goals    . DIET - INCREASE WATER INTAKE     Recommend drinking 6-8 glasses of water per day       Fall Risk Fall Risk  10/06/2018 08/08/2018 02/03/2018 11/04/2017 10/04/2017  Falls in the past year? 0 0 No Yes No  Number falls in past yr: 0 - - 2 or more -  Injury with Fall? 0 - - No -  Follow up Falls prevention discussed - - - -   FALL RISK PREVENTION PERTAINING TO THE HOME:  Any stairs in or around the home? Yes  If so, do they handrails? Yes   Home free of  loose throw rugs in walkways, pet beds, electrical cords, etc? Yes  Adequate lighting in your home to reduce risk of falls? Yes   ASSISTIVE DEVICES UTILIZED TO PREVENT FALLS:  Life alert? No  Use of a cane, walker or w/c? Yes  Grab bars in the bathroom? Yes  Shower chair or bench in shower? No  Elevated toilet seat or a handicapped toilet? No   DME  ORDERS:  DME order needed?  No   TIMED UP AND GO:  Was the test performed? Yes .  Length of time to ambulate 10 feet: 7 sec.   GAIT:  Appearance of gait: Gait slow, steady and with the use of an assistive device.   Education: Fall risk prevention has been discussed.  Intervention(s) required? No   Depression Screen PHQ 2/9 Scores 10/06/2018 08/08/2018 02/03/2018 11/04/2017  PHQ - 2 Score 0 0 0 0  PHQ- 9 Score - 0 - -    Cognitive Function     6CIT Screen 10/06/2018  What Year? 4 points  What month? 0 points  What time? 0 points  Count back from 20 0 points  Months in reverse 4 points  Repeat phrase 2 points  Total Score 10    Immunization History  Administered Date(s) Administered  . Influenza, High Dose Seasonal PF 06/09/2015, 09/08/2016, 09/08/2017, 08/08/2018    Qualifies for Shingles Vaccine? Yes  . Due for Shingrix. Education has been provided regarding the importance of this vaccine. Pt has been advised to call insurance company to determine out of pocket expense. Advised may also receive vaccine at local pharmacy or Health Dept. Verbalized acceptance and understanding.  Tdap: Up to date  Flu Vaccine: Up to date  Pneumococcal Vaccine: Up to date   Screening Tests Health Maintenance  Topic Date Due  . TETANUS/TDAP  02/01/2021  . INFLUENZA VACCINE  Completed  . PNA vac Low Risk Adult  Completed   Cancer Screenings:  Colorectal Screening:  No longer required.    Lung Cancer Screening: (Low Dose CT Chest recommended if Age 71-80 years, 30 pack-year currently smoking OR have quit w/in 15years.) does not  qualify.    Additional Screening:  Hepatitis C Screening: no longer required  Vision Screening: Recommended annual ophthalmology exams for early detection of glaucoma and other disorders of the eye. Is the patient up to date with their annual eye exam?  No  Who is the provider or what is the name of the office in which the pt attends annual eye exams? [facility]  Dental Screening: Recommended annual dental exams for proper oral hygiene  Community Resource Referral:  CRR required this visit?  No       Plan:    I have personally reviewed and addressed the Medicare Annual Wellness questionnaire and have noted the following in the patient's chart:  A. Medical and social history B. Use of alcohol, tobacco or illicit drugs  C. Current medications and supplements D. Functional ability and status E.  Nutritional status F.  Physical activity G. Advance directives H. List of other physicians I.  Hospitalizations, surgeries, and ER visits in previous 12 months J.  Vitals K. Screenings such as hearing and vision if needed, cognitive and depression L. Referrals and appointments   In addition, I have reviewed and discussed with patient certain preventive protocols, quality metrics, and best practice recommendations. A written personalized care plan for preventive services as well as general preventive health recommendations were provided to patient.   Signed,  Reather Littler, LPN Nurse Health Advisor   Nurse Notes: pt doing well and appreciative of visit today.

## 2018-10-06 NOTE — Patient Instructions (Signed)
Kirk Sanchez , Thank you for taking time to come for your Medicare Wellness Visit. I appreciate your ongoing commitment to your health goals. Please review the following plan we discussed and let me know if I can assist you in the future.   Screening recommendations/referrals: Colonoscopy: no longer required Recommended yearly ophthalmology/optometry visit for glaucoma screening and checkup Recommended yearly dental visit for hygiene and checkup  Vaccinations: Influenza vaccine: done 08/08/18 Pneumococcal vaccine: done 09/26/14 Tdap vaccine: done 02/02/11 Shingles vaccine: Shingrix discussed. Please contact your pharmacy for coverage information.   Advanced directives: Please bring a copy of your health care power of attorney and living will to the office at your convenience.  Conditions/risks identified: Recommend drinking 6-8 glasses of water per day  Next appointment: Please follow up in one year for your Medicare Annual Wellness visit.   Preventive Care 16 Years and Older, Male Preventive care refers to lifestyle choices and visits with your health care provider that can promote health and wellness. What does preventive care include?  A yearly physical exam. This is also called an annual well check.  Dental exams once or twice a year.  Routine eye exams. Ask your health care provider how often you should have your eyes checked.  Personal lifestyle choices, including:  Daily care of your teeth and gums.  Regular physical activity.  Eating a healthy diet.  Avoiding tobacco and drug use.  Limiting alcohol use.  Practicing safe sex.  Taking low doses of aspirin every day.  Taking vitamin and mineral supplements as recommended by your health care provider. What happens during an annual well check? The services and screenings done by your health care provider during your annual well check will depend on your age, overall health, lifestyle risk factors, and family history of  disease. Counseling  Your health care provider may ask you questions about your:  Alcohol use.  Tobacco use.  Drug use.  Emotional well-being.  Home and relationship well-being.  Sexual activity.  Eating habits.  History of falls.  Memory and ability to understand (cognition).  Work and work Astronomer. Screening  You may have the following tests or measurements:  Height, weight, and BMI.  Blood pressure.  Lipid and cholesterol levels. These may be checked every 5 years, or more frequently if you are over 76 years old.  Skin check.  Lung cancer screening. You may have this screening every year starting at age 61 if you have a 30-pack-year history of smoking and currently smoke or have quit within the past 15 years.  Fecal occult blood test (FOBT) of the stool. You may have this test every year starting at age 49.  Flexible sigmoidoscopy or colonoscopy. You may have a sigmoidoscopy every 5 years or a colonoscopy every 10 years starting at age 84.  Prostate cancer screening. Recommendations will vary depending on your family history and other risks.  Hepatitis C blood test.  Hepatitis B blood test.  Sexually transmitted disease (STD) testing.  Diabetes screening. This is done by checking your blood sugar (glucose) after you have not eaten for a while (fasting). You may have this done every 1-3 years.  Abdominal aortic aneurysm (AAA) screening. You may need this if you are a current or former smoker.  Osteoporosis. You may be screened starting at age 59 if you are at high risk. Talk with your health care provider about your test results, treatment options, and if necessary, the need for more tests. Vaccines  Your health care provider  may recommend certain vaccines, such as:  Influenza vaccine. This is recommended every year.  Tetanus, diphtheria, and acellular pertussis (Tdap, Td) vaccine. You may need a Td booster every 10 years.  Zoster vaccine. You may  need this after age 39.  Pneumococcal 13-valent conjugate (PCV13) vaccine. One dose is recommended after age 56.  Pneumococcal polysaccharide (PPSV23) vaccine. One dose is recommended after age 45. Talk to your health care provider about which screenings and vaccines you need and how often you need them. This information is not intended to replace advice given to you by your health care provider. Make sure you discuss any questions you have with your health care provider. Document Released: 08/15/2015 Document Revised: 04/07/2016 Document Reviewed: 05/20/2015 Elsevier Interactive Patient Education  2017 [place] Prevention in the Home Falls can cause injuries. They can happen to people of all ages. There are many things you can do to make your home safe and to help prevent falls. What can I do on the outside of my home?  Regularly fix the edges of walkways and driveways and fix any cracks.  Remove anything that might make you trip as you walk through a door, such as a raised step or threshold.  Trim any bushes or trees on the path to your home.  Use bright outdoor lighting.  Clear any walking paths of anything that might make someone trip, such as rocks or tools.  Regularly check to see if handrails are loose or broken. Make sure that both sides of any steps have handrails.  Any raised decks and porches should have guardrails on the edges.  Have any leaves, snow, or ice cleared regularly.  Use sand or salt on walking paths during winter.  Clean up any spills in your garage right away. This includes oil or grease spills. What can I do in the bathroom?  Use night lights.  Install grab bars by the toilet and in the tub and shower. Do not use towel bars as grab bars.  Use non-skid mats or decals in the tub or shower.  If you need to sit down in the shower, use a plastic, non-slip stool.  Keep the floor dry. Clean up any water that spills on the floor as soon as it  happens.  Remove soap buildup in the tub or shower regularly.  Attach bath mats securely with double-sided non-slip rug tape.  Do not have throw rugs and other things on the floor that can make you trip. What can I do in the bedroom?  Use night lights.  Make sure that you have a light by your bed that is easy to reach.  Do not use any sheets or blankets that are too big for your bed. They should not hang down onto the floor.  Have a firm chair that has side arms. You can use this for support while you get dressed.  Do not have throw rugs and other things on the floor that can make you trip. What can I do in the kitchen?  Clean up any spills right away.  Avoid walking on wet floors.  Keep items that you use a lot in easy-to-reach places.  If you need to reach something above you, use a strong step stool that has a grab bar.  Keep electrical cords out of the way.  Do not use floor polish or wax that makes floors slippery. If you must use wax, use non-skid floor wax.  Do not have throw rugs  and other things on the floor that can make you trip. What can I do with my stairs?  Do not leave any items on the stairs.  Make sure that there are handrails on both sides of the stairs and use them. Fix handrails that are broken or loose. Make sure that handrails are as long as the stairways.  Check any carpeting to make sure that it is firmly attached to the stairs. Fix any carpet that is loose or worn.  Avoid having throw rugs at the top or bottom of the stairs. If you do have throw rugs, attach them to the floor with carpet tape.  Make sure that you have a light switch at the top of the stairs and the bottom of the stairs. If you do not have them, ask someone to add them for you. What else can I do to help prevent falls?  Wear shoes that:  Do not have high heels.  Have rubber bottoms.  Are comfortable and fit you well.  Are closed at the toe. Do not wear sandals.  If you  use a stepladder:  Make sure that it is fully opened. Do not climb a closed stepladder.  Make sure that both sides of the stepladder are locked into place.  Ask someone to hold it for you, if possible.  Clearly mark and make sure that you can see:  Any grab bars or handrails.  First and last steps.  Where the edge of each step is.  Use tools that help you move around (mobility aids) if they are needed. These include:  Canes.  Walkers.  Scooters.  Crutches.  Turn on the lights when you go into a dark area. Replace any light bulbs as soon as they burn out.  Set up your furniture so you have a clear path. Avoid moving your furniture around.  If any of your floors are uneven, fix them.  If there are any pets around you, be aware of where they are.  Review your medicines with your doctor. Some medicines can make you feel dizzy. This can increase your chance of falling. Ask your doctor what other things that you can do to help prevent falls. This information is not intended to replace advice given to you by your health care provider. Make sure you discuss any questions you have with your health care provider. Document Released: 05/15/2009 Document Revised: 12/25/2015 Document Reviewed: 08/23/2014 Elsevier Interactive Patient Education  2017 [employer].

## 2018-11-15 ENCOUNTER — Other Ambulatory Visit: Payer: Self-pay | Admitting: Family Medicine

## 2018-11-15 DIAGNOSIS — E032 Hypothyroidism due to medicaments and other exogenous substances: Secondary | ICD-10-CM

## 2018-11-16 NOTE — Telephone Encounter (Signed)
Lab Results  Component Value Date   TSH 3.19 08/08/2018   Patient was provided a 6 month Rx in Dec Refill should not be needed until mid-June 2020

## 2018-11-23 ENCOUNTER — Encounter: Payer: Self-pay | Admitting: Family Medicine

## 2019-01-25 ENCOUNTER — Other Ambulatory Visit: Payer: Self-pay | Admitting: Nurse Practitioner

## 2019-01-25 DIAGNOSIS — I10 Essential (primary) hypertension: Secondary | ICD-10-CM

## 2019-02-01 IMAGING — CR DG CHEST 2V
2 series · 2 of 2 positions shown · non-contrast
Comparison: 04/08/2016, 07/24/2014

CLINICAL DATA: Chest pain

EXAM:
CHEST  2 VIEW

[chest lat]
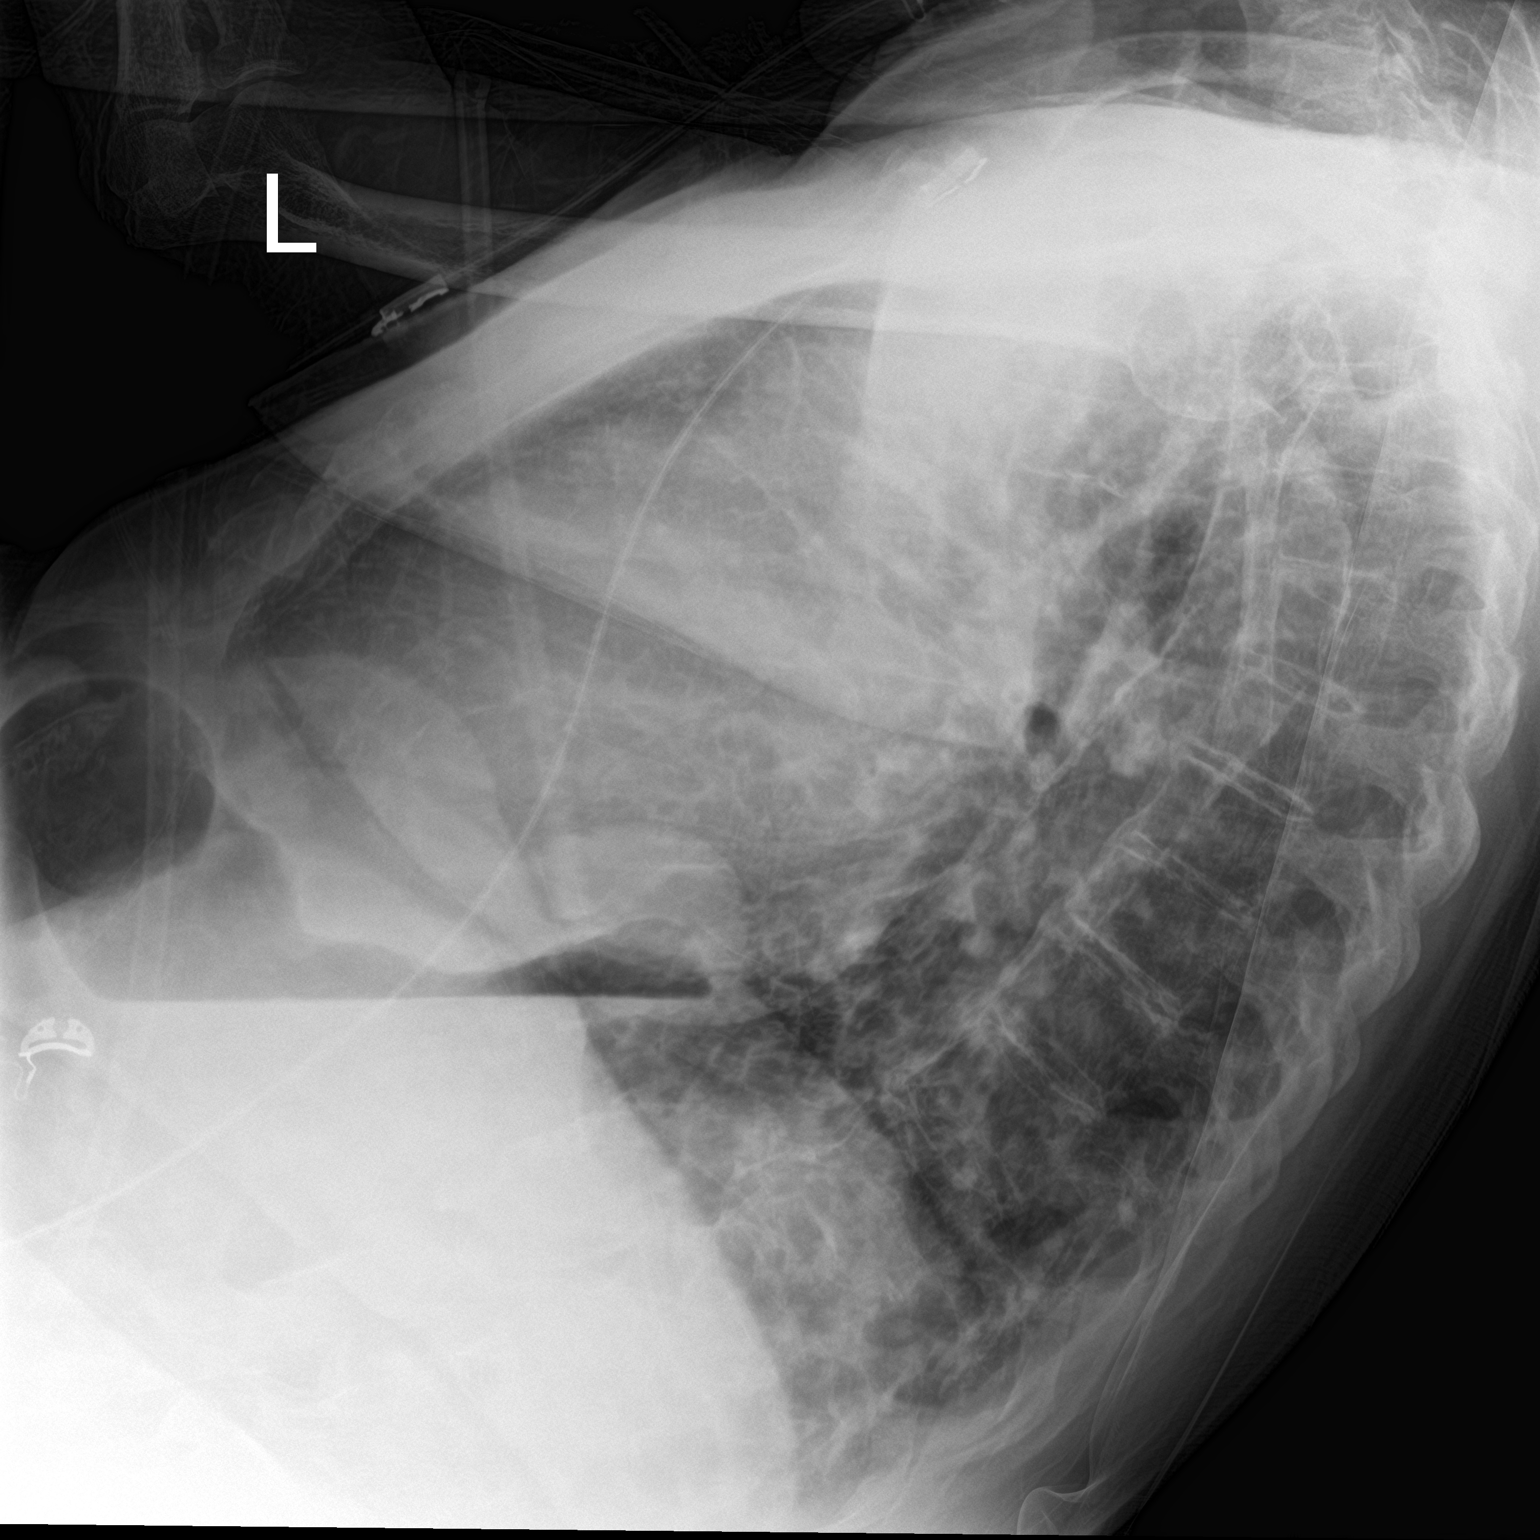

[chest ap]
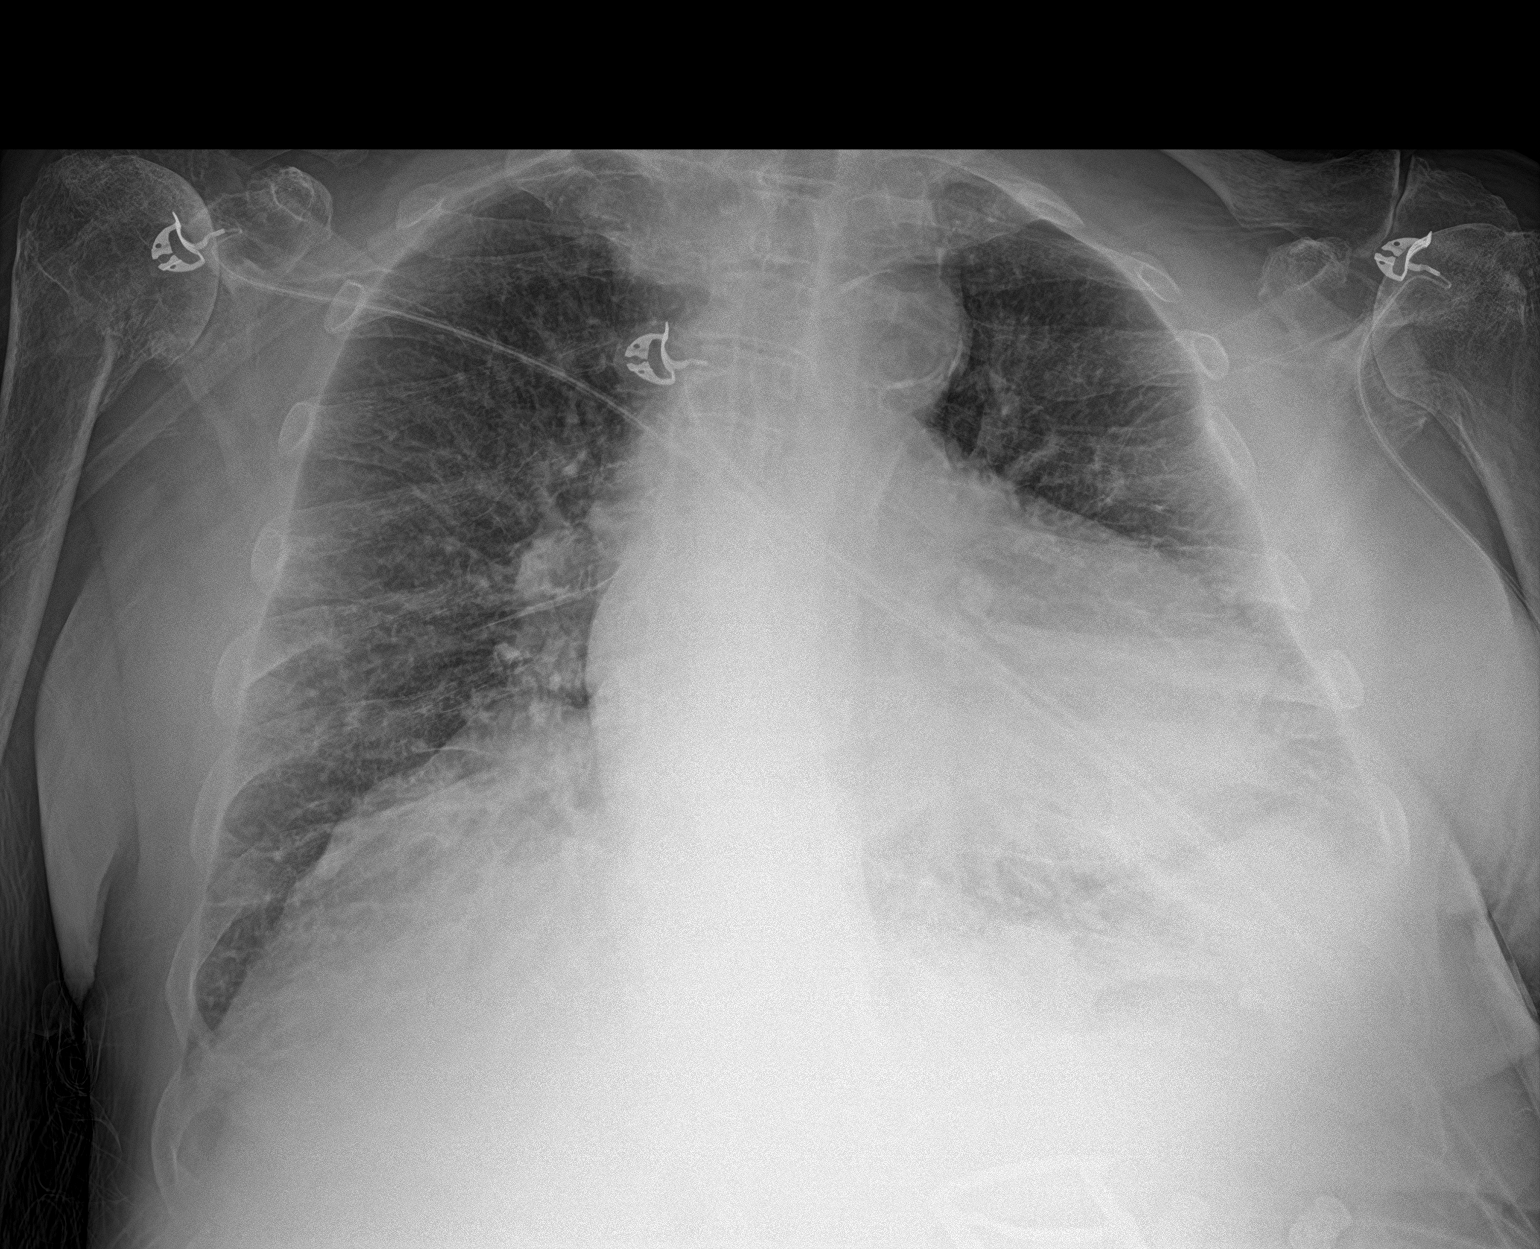

[2 of 2 positions shown; findings below may reference images not displayed]

FINDINGS: Mildly low lung volumes. Streaky bibasilar atelectasis. Bronchitic
changes at the bases. No focal consolidation or pleural effusion
cardiomegaly with aortic atherosclerosis. No pneumothorax. Small
hiatal hernia.
IMPRESSION: Bronchitic changes at the bases with streaky atelectasis.
Cardiomegaly.

## 2019-02-03 IMAGING — CR DG CHEST 2V
1 series · 2 of 2 positions shown · non-contrast
Comparison: 04/08/2016

CLINICAL DATA: Shortness of breath

EXAM:
CHEST  2 VIEW

[Series 1: dg chest 2 view · 0.14mm/px · 2 of 2 slices shown]
[im 1/2]
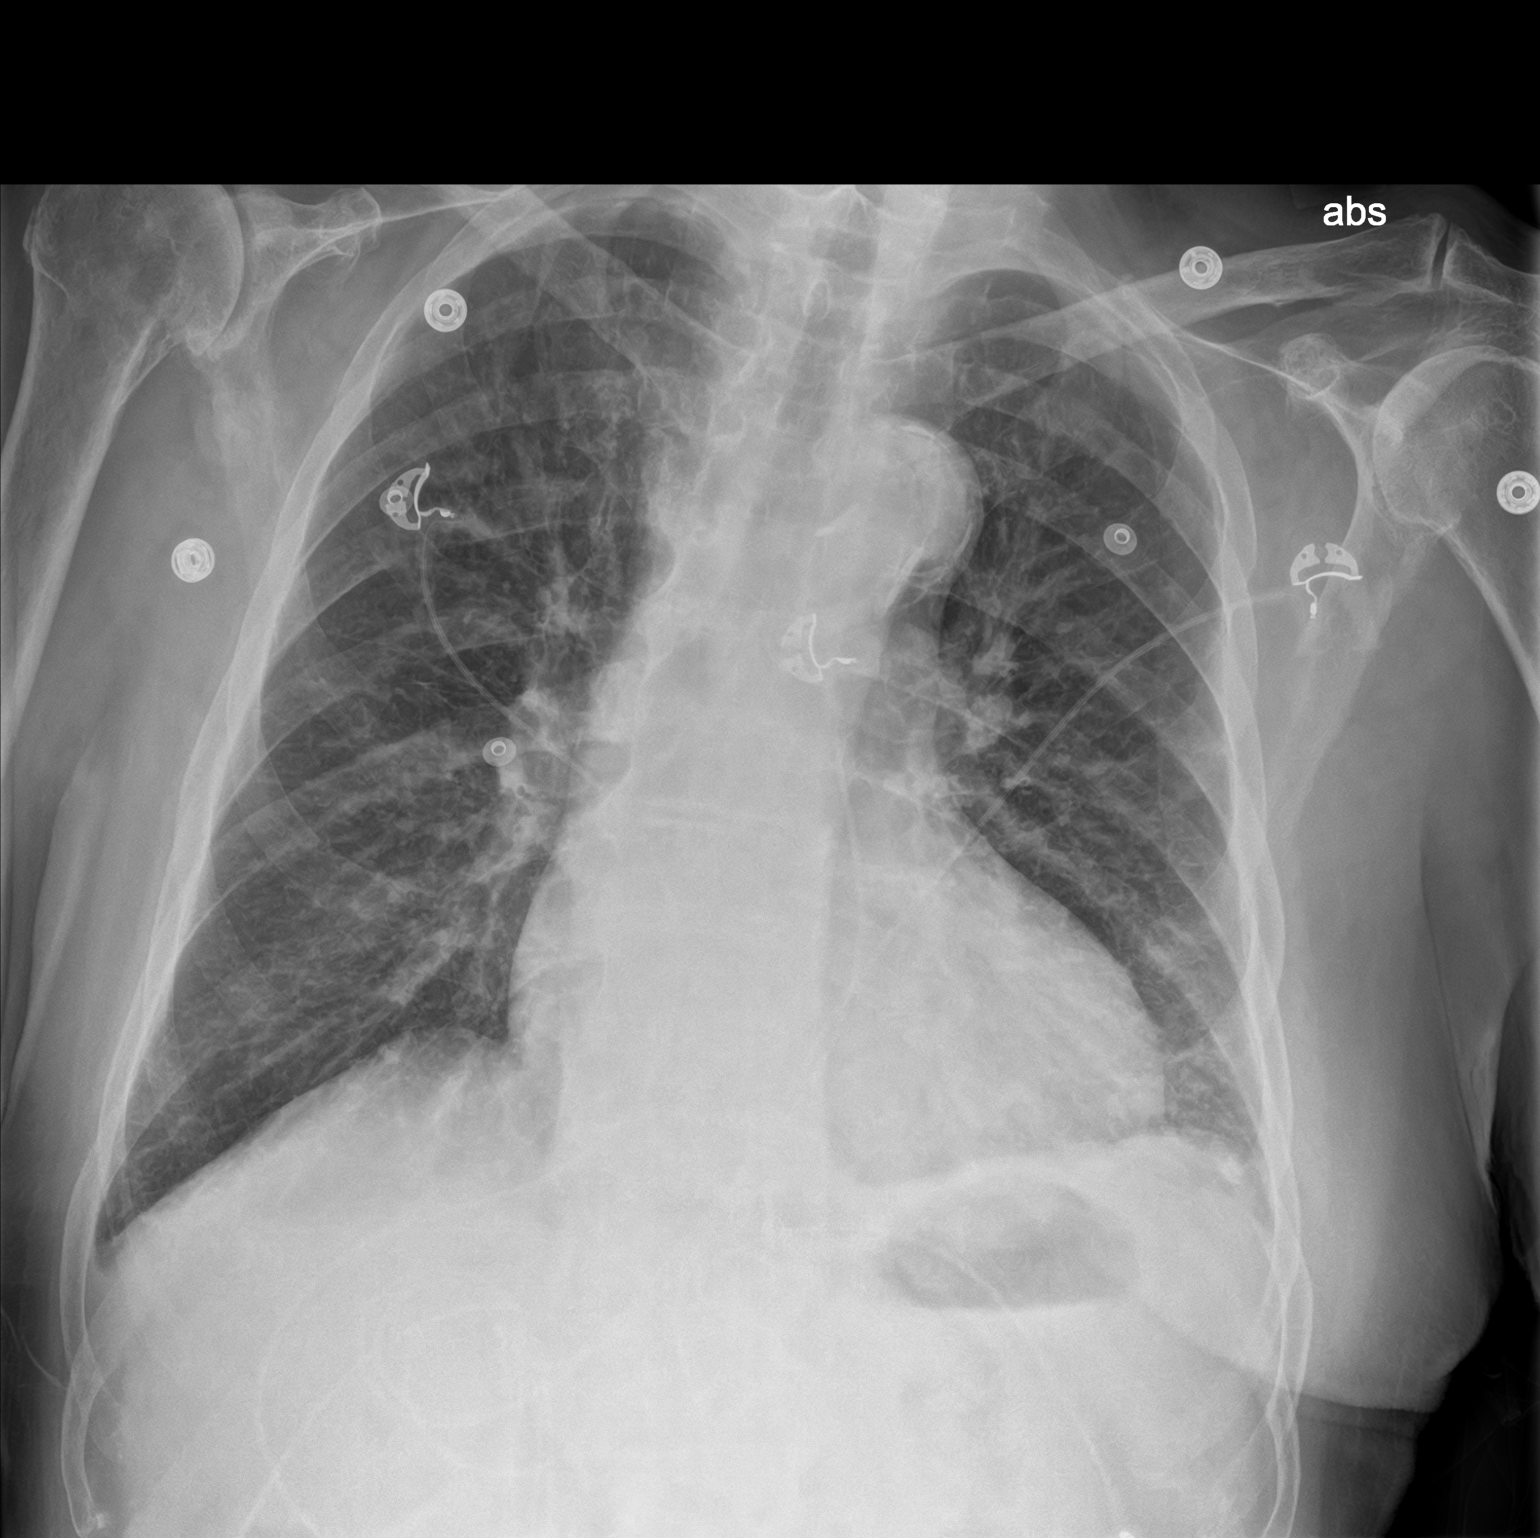
[im 2/2]
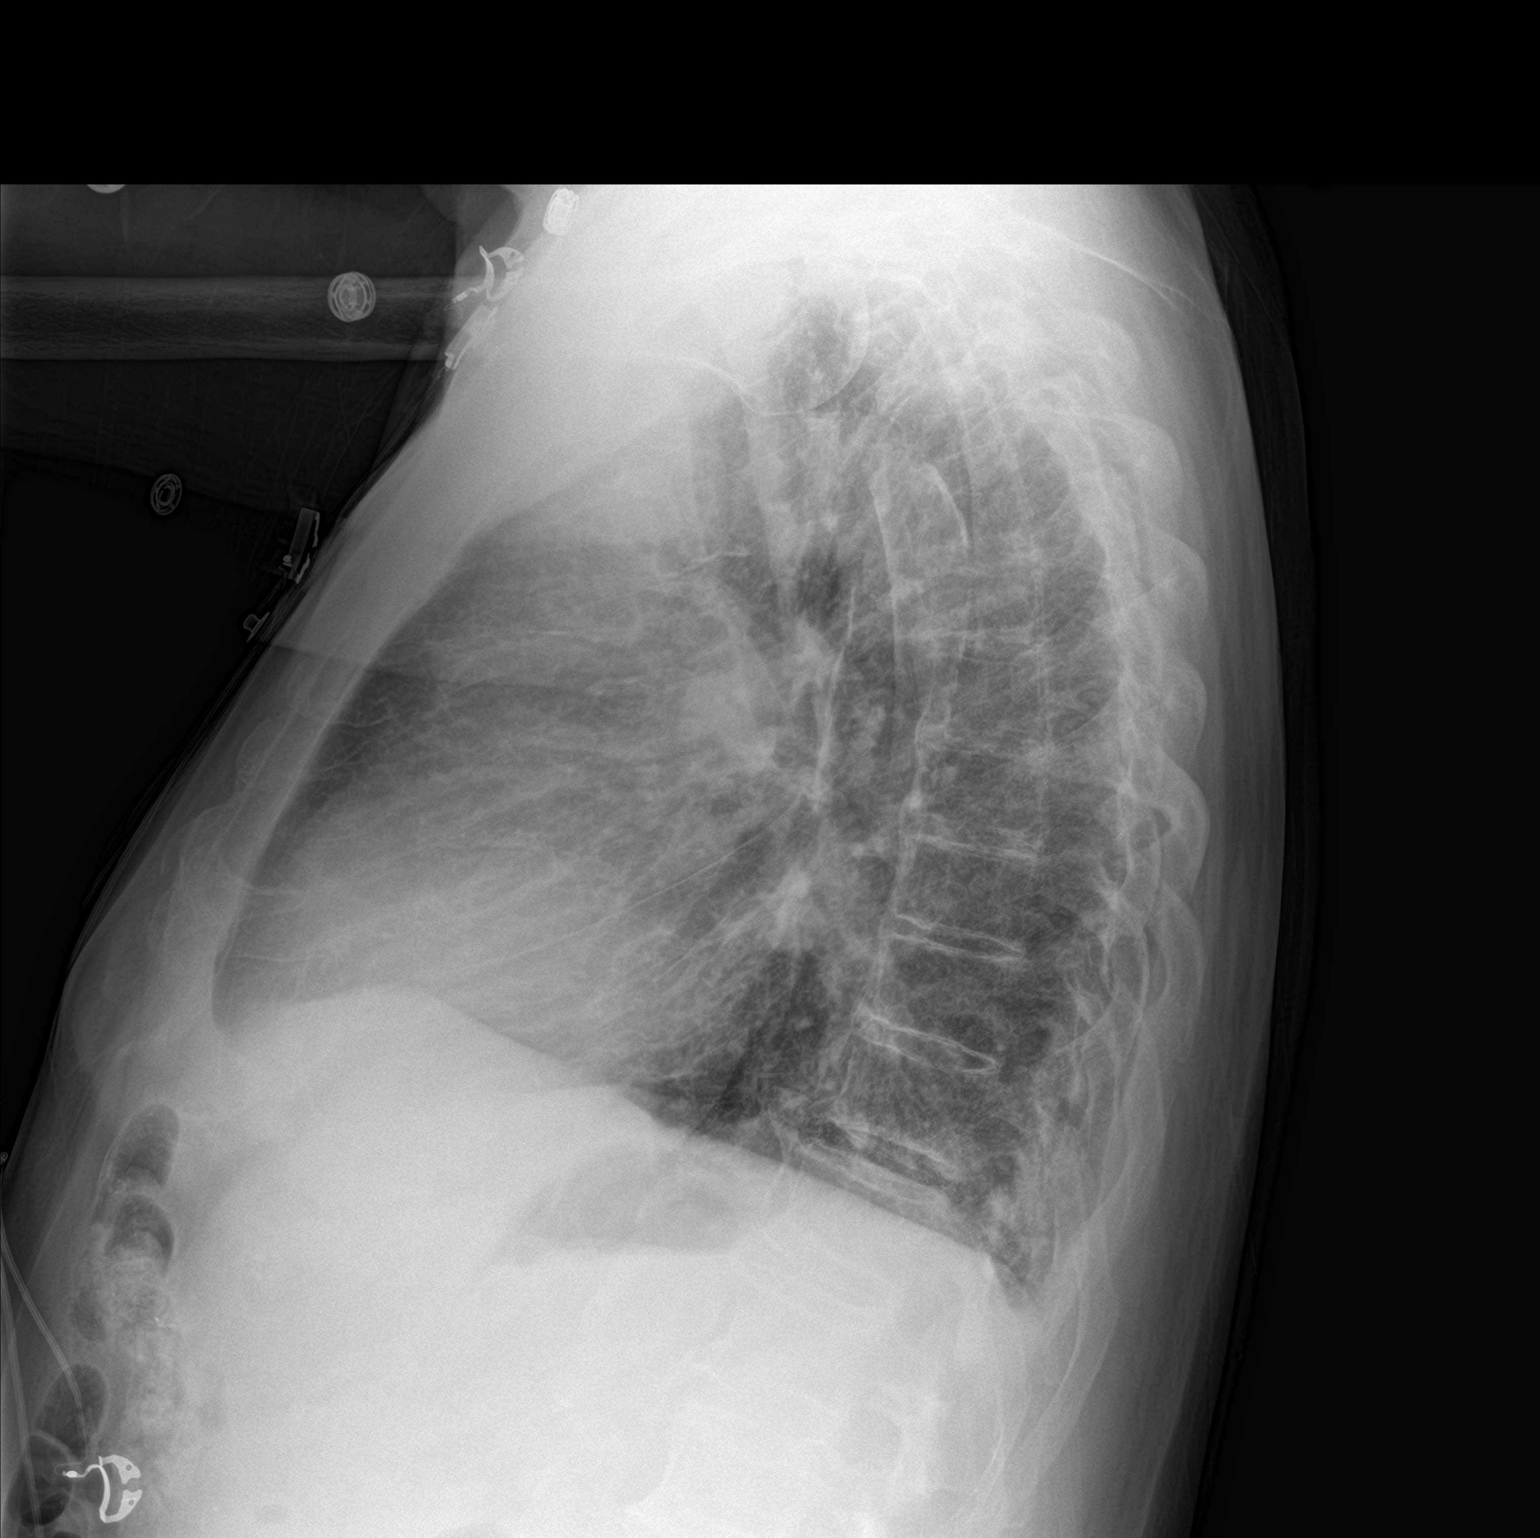

[2 of 2 positions shown; findings below may reference images not displayed]

FINDINGS: There is mild bilateral interstitial prominence. There is no focal
parenchymal opacity. There is no pleural effusion or pneumothorax.
There is mild stable cardiomegaly. There is thoracic aortic
atherosclerosis.

There is moderate osteoarthritis of the right glenohumeral joint.
IMPRESSION: Cardiomegaly with mild pulmonary vascular congestion.

## 2019-02-05 DIAGNOSIS — E6609 Other obesity due to excess calories: Secondary | ICD-10-CM | POA: Diagnosis not present

## 2019-02-05 DIAGNOSIS — Z6830 Body mass index (BMI) 30.0-30.9, adult: Secondary | ICD-10-CM | POA: Diagnosis not present

## 2019-02-05 DIAGNOSIS — I10 Essential (primary) hypertension: Secondary | ICD-10-CM | POA: Diagnosis not present

## 2019-02-05 DIAGNOSIS — E7849 Other hyperlipidemia: Secondary | ICD-10-CM | POA: Diagnosis not present

## 2019-02-05 DIAGNOSIS — I25118 Atherosclerotic heart disease of native coronary artery with other forms of angina pectoris: Secondary | ICD-10-CM | POA: Diagnosis not present

## 2019-02-05 DIAGNOSIS — E079 Disorder of thyroid, unspecified: Secondary | ICD-10-CM | POA: Diagnosis not present

## 2019-02-05 DIAGNOSIS — R011 Cardiac murmur, unspecified: Secondary | ICD-10-CM | POA: Diagnosis not present

## 2019-02-05 DIAGNOSIS — I429 Cardiomyopathy, unspecified: Secondary | ICD-10-CM | POA: Diagnosis not present

## 2019-02-05 DIAGNOSIS — I5032 Chronic diastolic (congestive) heart failure: Secondary | ICD-10-CM | POA: Diagnosis not present

## 2019-02-06 ENCOUNTER — Other Ambulatory Visit: Payer: Self-pay

## 2019-02-06 ENCOUNTER — Encounter: Payer: Self-pay | Admitting: Nurse Practitioner

## 2019-02-06 ENCOUNTER — Ambulatory Visit (INDEPENDENT_AMBULATORY_CARE_PROVIDER_SITE_OTHER): Payer: Medicare Other | Admitting: Nurse Practitioner

## 2019-02-06 VITALS — BP 118/70 | HR 75 | Temp 96.8°F | Resp 14 | Ht 68.0 in | Wt 191.7 lb

## 2019-02-06 DIAGNOSIS — I10 Essential (primary) hypertension: Secondary | ICD-10-CM | POA: Diagnosis not present

## 2019-02-06 DIAGNOSIS — E785 Hyperlipidemia, unspecified: Secondary | ICD-10-CM

## 2019-02-06 DIAGNOSIS — I2510 Atherosclerotic heart disease of native coronary artery without angina pectoris: Secondary | ICD-10-CM

## 2019-02-06 DIAGNOSIS — D692 Other nonthrombocytopenic purpura: Secondary | ICD-10-CM | POA: Diagnosis not present

## 2019-02-06 DIAGNOSIS — E032 Hypothyroidism due to medicaments and other exogenous substances: Secondary | ICD-10-CM

## 2019-02-06 DIAGNOSIS — I5023 Acute on chronic systolic (congestive) heart failure: Secondary | ICD-10-CM

## 2019-02-06 DIAGNOSIS — K219 Gastro-esophageal reflux disease without esophagitis: Secondary | ICD-10-CM | POA: Diagnosis not present

## 2019-02-06 DIAGNOSIS — I480 Paroxysmal atrial fibrillation: Secondary | ICD-10-CM | POA: Diagnosis not present

## 2019-02-06 MED ORDER — LEVOTHYROXINE SODIUM 25 MCG PO TABS: ORAL_TABLET | ORAL | 1 refills | Status: DC

## 2019-02-06 MED ORDER — SIMVASTATIN 40 MG PO TABS: ORAL_TABLET | ORAL | 1 refills | Status: DC

## 2019-02-06 NOTE — Progress Notes (Signed)
Name: Kirk Sanchez   MRN: [id number]    DOB: 24-Sep-1924   Date:02/06/2019       Progress Note  Subjective  Chief Complaint  Chief Complaint  Patient presents with  . Follow-up    HPI  Endorses some intermittent mild heart burn over the past few years, states resolves with tums. Has not had any in the last week or so.   Hypertension, CHF & Paroxysmal atrial fibrillation  Patient is on metoprolol 50mg  daily. Follows up with Dr. Juliann Paresallwood- cardiology  Takes medications as prescribed with no missed doses a month.  He is non-compliant with low-salt diet.  Denies chest pain, headaches, blurry vision.  Hyperlipidemia Patient rx simvastatin 40mg  daily. Takes medications as prescribed with no missed doses a month.  Diet: foods Denies myalgias Lab Results  Component Value Date   CHOL 171 08/08/2018   HDL 64 08/08/2018   LDLCALC 86 08/08/2018   TRIG 113 08/08/2018   CHOLHDL 2.7 08/08/2018    Hypothyroidism Patient rx levothryoxine 25 mcg daily. Has been on this dose for many years. Patient denies fatigue/palpitations, insomnia, constipation/diarrhea, hot/cold intolerances,   Lab Results  Component Value Date   TSH 3.19 08/08/2018     PH[facility]/9: [facility] 02/06/2019 10/06/2018 08/08/2018 02/03/2018 11/04/2017  Decreased Interest 0 0 0 0 0  Down, Depressed, Hopeless 0 0 0 0 0  PHQ - 2 Score 0 0 0 0 0  Altered sleeping 0 - 0 - -  Tired, decreased energy 0 - 0 - -  Change in appetite 0 - 0 - -  Feeling bad or failure about yourself  0 - 0 - -  Trouble concentrating 0 - 0 - -  Moving slowly or fidgety/restless 0 - 0 - -  Suicidal thoughts 0 - - - -  PHQ-9 Score 0 - 0 - -  Difficult doing work/chores Not difficult at all - Not difficult at all - -     PHQ reviewed. Negative  Patient Active Problem List   Diagnosis Date Noted  . Weakness generalized   . DNR (do not resuscitate)   . Palliative care by specialist   . Congestive heart failure (HCC)   . Hyponatremia  09/10/2017  . Right shoulder tendonitis 03/08/2017  . Atrial fibrillation (HCC) 03/06/2016  . Presbycusis of both ears 03/06/2016  . Medication monitoring encounter 03/03/2016  . Anemia 03/03/2016  . Arteriosclerosis of coronary artery 01/28/2015  . Cardiac murmur 01/28/2015  . HLD (hyperlipidemia) 01/28/2015  . BP (high blood pressure) 01/28/2015  . Hypothyroidism 01/28/2015    Past Medical History:  Diagnosis Date  . Atrial fibrillation (HCC)   . Hyperlipidemia   . Hypertension   . Kidney stone   . Presbycusis of both ears 03/06/2016  . Thyroid disease     Past Surgical History:  Procedure Laterality Date  . HAND ARTHROPLASTY    . HIP ARTHROPLASTY    . STENT PLACEMENT RT URETER ([facility] HX)      Social History   Tobacco Use  . Smoking status: Never Smoker  . Smokeless tobacco: Never Used  Substance Use Topics  . Alcohol use: No    Alcohol/week: 0.0 standard drinks     Current Outpatient Medications:  .  aspirin 81 MG tablet, Take 81 mg by mouth daily., Disp: , Rfl:  .  Calcium Carb-Cholecalciferol (CALCIUM-VITAMIN D) 500-200 MG-UNIT tablet, Take 1 tablet by mouth 2 (two) times daily. , Disp: , Rfl:  .  Elastic Bandages &  Supports (MEDICAL COMPRESSION STOCKINGS) MISC, Put on both legs in the morning, take off at night; do NOT sleep in them, Disp: 2 each, Rfl: 1 .  levothyroxine (SYNTHROID, LEVOTHROID) 25 MCG tablet, TAKE 1 TABLET BY MOUTH ONCE DAILY ON AN EMPTY STOMACH. WAIT 30 MINUTES BEFORE TAKING OTHER MEDS., Disp: 90 tablet, Rfl: 1 .  metoprolol succinate (TOPROL-XL) 50 MG 24 hr tablet, TAKE 1 TABLET BY MOUTH ONCE DAILY, Disp: 90 tablet, Rfl: 1 .  simvastatin (ZOCOR) 40 MG tablet, TAKE 1 TABLET BY MOUTH ONCE EVERY EVENING FOR CHOLESTEROL., Disp: 90 tablet, Rfl: 1  No Known Allergies  Review of Systems  Constitutional: Negative for chills, fever and malaise/fatigue.  Respiratory: Negative for cough and shortness of breath.   Cardiovascular: Negative for chest  pain, palpitations and leg swelling.  Gastrointestinal: Positive for heartburn. Negative for abdominal pain, blood in stool and nausea.  Genitourinary: Negative for dysuria and frequency.  Musculoskeletal: Negative for joint pain and myalgias.  Skin: Negative for rash.  Neurological: Negative for dizziness and headaches.  Psychiatric/Behavioral: The patient is not nervous/anxious and does not have insomnia.      No other specific complaints in a complete review of systems (except as listed in HPI above).  Objective  Vitals:   02/06/19 1016  BP: 118/70  Pulse: 75  Resp: 14  Temp: (!) 96.8 F (36 C)  TempSrc: Temporal  SpO2: 95%  Weight: 191 lb 11.2 oz (87 kg)  Height: 5\' 8"  (1.727 m)    Body mass index is 29.15 kg/m.  Nursing Note and Vital Signs reviewed.  Physical Exam Vitals signs reviewed.  Constitutional:      Appearance: He is well-developed.  HENT:     Head: Normocephalic and atraumatic.     Ears:     Comments: Hard of hearing  Neck:     Musculoskeletal: Normal range of motion and neck supple.     Vascular: No carotid bruit.  Cardiovascular:     Rate and Rhythm: Rhythm irregular.     Pulses: Normal pulses.     Comments: No peripheral edema  Pulmonary:     Effort: Pulmonary effort is normal.     Breath sounds: Normal breath sounds.  Abdominal:     General: Bowel sounds are normal.     Palpations: Abdomen is soft.     Tenderness: There is no abdominal tenderness.  Musculoskeletal:     Comments: Uses cane, steady ambulation   Skin:    General: Skin is warm and dry.     Capillary Refill: Capillary refill takes less than 2 seconds.     Comments: Senile purpura bilateral arms   Neurological:     Mental Status: He is alert and oriented to person, place, and time.     GCS: GCS eye subscore is 4. GCS verbal subscore is 5. GCS motor subscore is 6.     Sensory: No sensory deficit.  Psychiatric:        Speech: Speech normal.        Behavior: Behavior  normal.        Thought Content: Thought content normal.        Judgment: Judgment normal.        No results found for this or any previous visit (from the past 48 hour(s)).  Assessment & Plan  1. Essential hypertension Stable, continue meds, Follows up with cards,   2. Paroxysmal atrial fibrillation (HCC) Stable, continue meds, Follows up with cards,   3. Acute  on chronic systolic congestive heart failure (HCC) Stable, continue meds, Follows up with cards,   4. Hypothyroidism due to non-medication exogenous substances - levothyroxine (SYNTHROID) 25 MCG tablet; TAKE 1 TABLET BY MOUTH ONCE DAILY ON AN EMPTY STOMACH. WAIT 30 MINUTES BEFORE TAKING OTHER MEDS.  Dispense: 90 tablet; Refill: 1  5. Hyperlipidemia, unspecified hyperlipidemia type - simvastatin (ZOCOR) 40 MG tablet; TAKE 1 TABLET BY MOUTH ONCE EVERY EVENING FOR CHOLESTEROL.  Dispense: 90 tablet; Refill: 1  6. Senile purpura ([place]) Takes asa, reassurance   7. GERD without esophagitis Discussed diet

## 2019-02-06 NOTE — Patient Instructions (Signed)
Food Choices for Gastroesophageal Reflux Disease, Adult When you have gastroesophageal reflux disease (GERD), the foods you eat and your eating habits are very important. Choosing the right foods can help ease the discomfort of GERD. Consider working with a diet and nutrition specialist (dietitian) to help you make healthy food choices. What general guidelines should I follow?  Eating plan  Choose healthy foods low in fat, such as fruits, vegetables, whole grains, low-fat dairy products, and lean meat, fish, and poultry.  Eat frequent, small meals instead of three large meals each day. Eat your meals slowly, in a relaxed setting. Avoid bending over or lying down until 2-3 hours after eating.  Limit high-fat foods such as fatty meats or fried foods.  Limit your intake of oils, butter, and shortening to less than 8 teaspoons each day.  Avoid the following: ? Foods that cause symptoms. These may be different for different people. Keep a food diary to keep track of foods that cause symptoms. ? Alcohol. ? Drinking large amounts of liquid with meals. ? Eating meals during the 2-3 hours before bed.  Cook foods using methods other than frying. This may include baking, grilling, or broiling. Lifestyle  Maintain a healthy weight. Ask your health care provider what weight is healthy for you. If you need to lose weight, work with your health care provider to do so safely.  Exercise for at least 30 minutes on 5 or more days each week, or as told by your health care provider.  Avoid wearing clothes that fit tightly around your waist and chest.  Do not use any products that contain nicotine or tobacco, such as cigarettes and e-cigarettes. If you need help quitting, ask your health care provider.  Sleep with the head of your bed raised. Use a wedge under the mattress or blocks under the bed frame to raise the head of the bed. What foods are not recommended? The items listed may not be a complete  list. Talk with your dietitian about what dietary choices are best for you. Grains Pastries or quick breads with added fat. French toast. Vegetables Deep fried vegetables. French fries. Any vegetables prepared with added fat. Any vegetables that cause symptoms. For some people this may include tomatoes and tomato products, chili peppers, onions and garlic, and horseradish. Fruits Any fruits prepared with added fat. Any fruits that cause symptoms. For some people this may include citrus fruits, such as oranges, grapefruit, pineapple, and lemons. Meats and other protein foods High-fat meats, such as fatty beef or pork, hot dogs, ribs, ham, sausage, salami and bacon. Fried meat or protein, including fried fish and fried chicken. Nuts and nut butters. Dairy Whole milk and chocolate milk. Sour cream. Cream. Ice cream. Cream cheese. Milk shakes. Beverages Coffee and tea, with or without caffeine. Carbonated beverages. Sodas. Energy drinks. Fruit juice made with acidic fruits (such as orange or grapefruit). Tomato juice. Alcoholic drinks. Fats and oils Butter. Margarine. Shortening. Ghee. Sweets and desserts Chocolate and cocoa. Donuts. Seasoning and other foods Pepper. Peppermint and spearmint. Any condiments, herbs, or seasonings that cause symptoms. For some people, this may include curry, hot sauce, or vinegar-based salad dressings. Summary  When you have gastroesophageal reflux disease (GERD), food and lifestyle choices are very important to help ease the discomfort of GERD.  Eat frequent, small meals instead of three large meals each day. Eat your meals slowly, in a relaxed setting. Avoid bending over or lying down until 2-3 hours after eating.  Limit high-fat   foods such as fatty meat or fried foods. This information is not intended to replace advice given to you by your health care provider. Make sure you discuss any questions you have with your health care provider. Document Released:  07/19/2005 Document Revised: 11/09/2018 Document Reviewed: 07/20/2016 Elsevier Patient Education  2020 [employer]  

## 2019-07-12 ENCOUNTER — Other Ambulatory Visit: Payer: Self-pay

## 2019-07-12 DIAGNOSIS — E785 Hyperlipidemia, unspecified: Secondary | ICD-10-CM

## 2019-07-12 MED ORDER — SIMVASTATIN 40 MG PO TABS: ORAL_TABLET | ORAL | 1 refills | Status: DC

## 2019-08-09 ENCOUNTER — Ambulatory Visit (INDEPENDENT_AMBULATORY_CARE_PROVIDER_SITE_OTHER): Payer: Medicare Other | Admitting: Family Medicine

## 2019-08-09 ENCOUNTER — Other Ambulatory Visit: Payer: Self-pay

## 2019-08-09 ENCOUNTER — Encounter: Payer: Self-pay | Admitting: Family Medicine

## 2019-08-09 DIAGNOSIS — I5042 Chronic combined systolic (congestive) and diastolic (congestive) heart failure: Secondary | ICD-10-CM | POA: Insufficient documentation

## 2019-08-09 DIAGNOSIS — Z91199 Patient's noncompliance with other medical treatment and regimen due to unspecified reason: Secondary | ICD-10-CM

## 2019-08-09 DIAGNOSIS — I5032 Chronic diastolic (congestive) heart failure: Secondary | ICD-10-CM | POA: Insufficient documentation

## 2019-08-09 DIAGNOSIS — I255 Ischemic cardiomyopathy: Secondary | ICD-10-CM | POA: Diagnosis not present

## 2019-08-09 DIAGNOSIS — Z5329 Procedure and treatment not carried out because of patient's decision for other reasons: Secondary | ICD-10-CM

## 2019-08-09 DIAGNOSIS — E079 Disorder of thyroid, unspecified: Secondary | ICD-10-CM | POA: Diagnosis not present

## 2019-08-09 DIAGNOSIS — E7849 Other hyperlipidemia: Secondary | ICD-10-CM | POA: Diagnosis not present

## 2019-08-09 DIAGNOSIS — I25118 Atherosclerotic heart disease of native coronary artery with other forms of angina pectoris: Secondary | ICD-10-CM | POA: Diagnosis not present

## 2019-08-09 DIAGNOSIS — R011 Cardiac murmur, unspecified: Secondary | ICD-10-CM | POA: Diagnosis not present

## 2019-08-09 DIAGNOSIS — I10 Essential (primary) hypertension: Secondary | ICD-10-CM | POA: Diagnosis not present

## 2019-08-09 NOTE — Progress Notes (Signed)
Called home phone x2 at [phone], LVM. Called other phone x1 at [phone], LVM. And happy to see patient should he call back.  I do feel that with the COVID-19 pandemic surge happening a telephone visit is most appropriate for this patient.

## 2019-08-18 DIAGNOSIS — Z23 Encounter for immunization: Secondary | ICD-10-CM | POA: Diagnosis not present

## 2019-09-07 ENCOUNTER — Other Ambulatory Visit: Payer: Self-pay

## 2019-09-07 DIAGNOSIS — I10 Essential (primary) hypertension: Secondary | ICD-10-CM

## 2019-09-07 MED ORDER — METOPROLOL SUCCINATE ER 50 MG PO TB24: 50.0000 mg | ORAL_TABLET | Freq: Every day | ORAL | 1 refills | Status: DC

## 2019-09-10 DIAGNOSIS — Z23 Encounter for immunization: Secondary | ICD-10-CM | POA: Diagnosis not present

## 2019-10-09 ENCOUNTER — Other Ambulatory Visit: Payer: Self-pay | Admitting: Emergency Medicine

## 2019-10-09 DIAGNOSIS — E032 Hypothyroidism due to medicaments and other exogenous substances: Secondary | ICD-10-CM

## 2019-10-09 MED ORDER — LEVOTHYROXINE SODIUM 25 MCG PO TABS
ORAL_TABLET | ORAL | 0 refills | Status: DC
Start: 2019-10-09 — End: 2020-01-18

## 2019-10-10 NOTE — Telephone Encounter (Signed)
Lvm for the patient that his thyroid meds was approved but needs an appt for TSH recheck per Angelica Chessman

## 2019-10-31 NOTE — Progress Notes (Signed)
Patient ID: Kirk Sanchez, male    DOB: 1924/08/28, 86 y.o.   MRN: [id number]  PCP: Towanda Malkin, MD  Chief Complaint  Patient presents with  . Hypertension  . Hyperlipidemia    Subjective:   Kirk Sanchez is a 86 y.o. male, presents to clinic with CC of the following:  Chief Complaint  Patient presents with  . Hypertension  . Hyperlipidemia    HPI:  Patient is a 86 year old male who presents today for follow-up. He noted he feels great, has no complaints.  His last follow-up was with cardiology in early January 2021 [place] significant for CAD, MI s/p PCI with BMS stent placement to LAD (1996), HTN, thyroid disease, hyperlipidemia, paroxysmal atrial fibrillation, heart murmur, CHF with systolic and diastolic dysfunction with EF=40-45% and Cardiomyopathy    he had no specific complaints at that visit, noted was taking his medications and doing well.  The patient is currently being treated for the following medical issues: CAD confirmed via Cardiac Catheterization, patient is on statin therapy and reports that he also takes a baby aspirin daily; MI s/p PCI with BMS stent placement to LAD (1996); HTN that is reasonably controlled with metoprolol; thyroid disease that is reasonably controlled with levothyroxine; hyperlipidemia that is reasonably controlled with simvastatin; paroxysmal atrial fibrillation that is reasonably controlled with metoprolol; CHF with systolic and diastolic dysfunction with EF=40-45% that is reasonably controlled with metoprolol.   His last visit at [facility] with 02/06/2019 with the following issues reviewed:  History of some intermittent GERD over the past few years, no recent increased concerns.    Hypertension, CHF & Paroxysmal atrial fibrillation  Patient is on metoprolol 50mg  daily. Follows up with Dr. Clayborn Bigness- cardiology as above noted.  He noted that his heart rate is irregular and has been that way for years BP Readings from Last 3  Encounters:  11/01/19 110/72  02/06/19 118/70  10/06/18 122/72   Denies chest pain, SOB, increased headaches, blurry vision.  Hyperlipidemia Patient rx simvastatin 40mg  daily. Takes medications as prescribed  Denies myalgias He wanted to get his cholesterol checked with the labs. Lab Results  Component Value Date   CHOL 171 08/08/2018   HDL 64 08/08/2018   LDLCALC 86 08/08/2018   TRIG 113 08/08/2018   CHOLHDL 2.7 08/08/2018    Hypothyroidism Patient rx levothryoxine 25 mcg daily. Has been on this dose for many years. Patient denies increased fatigue, worsening constipation/diarrhea and noted he does take an occasional laxative, has not gained weight, and thinks he has lost a few pounds since his last visit. Wt Readings from Last 3 Encounters:  11/01/19 181 lb 8 oz (82.3 kg)  02/06/19 191 lb 11.2 oz (87 kg)  10/06/18 191 lb 1.6 oz (86.7 kg)    Lab Results  Component Value Date   TSH 3.19 08/08/2018   Mild Anemia - stable over past years noted, iron panel normal last check Lab Results  Component Value Date   WBC 6.3 08/08/2018   HGB 12.2 (L) 08/08/2018   HCT 37.0 (L) 08/08/2018   MCV 89.6 08/08/2018   PLT 197 08/08/2018   Lab Results  Component Value Date   IRON 60 08/08/2018   TIBC 283 08/08/2018   FERRITIN 118 08/08/2018    He denied any bleeding concerns, with no bleeding per rectum or dark black stools   The patient lives at home alone but has 3 sons that check in on him at times.  He states  that he is able to perform his activities of daily living without assistance and that he continues to drive.  He denies missing medication doses.  He noted he mows his yard, cleans the house, and does not need help taking care of things at home. The patient is very hard of hearing but was a good historian and able to answer all of the questions regarding his health.    Patient Active Problem List   Diagnosis Date Noted  . Senile purpura (HCC) 02/06/2019  . DNR (do  not resuscitate)   . Palliative care by specialist   . Congestive heart failure (HCC)   . Hyponatremia 09/10/2017  . Right shoulder tendonitis 03/08/2017  . Atrial fibrillation (HCC) 03/06/2016  . Presbycusis of both ears 03/06/2016  . Anemia 03/03/2016  . Arteriosclerosis of coronary artery 01/28/2015  . Cardiac murmur 01/28/2015  . HLD (hyperlipidemia) 01/28/2015  . BP (high blood pressure) 01/28/2015  . Hypothyroidism 01/28/2015      Current Outpatient Medications:  .  aspirin 81 MG tablet, Take 81 mg by mouth daily., Disp: , Rfl:  .  levothyroxine (SYNTHROID) 25 MCG tablet, TAKE 1 TABLET BY MOUTH ONCE DAILY ON AN EMPTY STOMACH. WAIT 30 MINUTES BEFORE TAKING OTHER MEDS., Disp: 90 tablet, Rfl: 0 .  simvastatin (ZOCOR) 40 MG tablet, TAKE 1 TABLET BY MOUTH ONCE EVERY EVENING FOR CHOLESTEROL., Disp: 90 tablet, Rfl: 1 .  Calcium Carb-Cholecalciferol (CALCIUM-VITAMIN D) 500-200 MG-UNIT tablet, Take 1 tablet by mouth 2 (two) times daily. , Disp: , Rfl:  .  Elastic Bandages & Supports (MEDICAL COMPRESSION STOCKINGS) MISC, Put on both legs in the morning, take off at night; do NOT sleep in them (Patient not taking: Reported on 11/01/2019), Disp: 2 each, Rfl: 1 .  metoprolol succinate (TOPROL-XL) 50 MG 24 hr tablet, Take 1 tablet (50 mg total) by mouth daily. Take with or immediately following a meal. (Patient not taking: Reported on 11/01/2019), Disp: 90 tablet, Rfl: 1   No Known Allergies   Past Surgical History:  Procedure Laterality Date  . HAND ARTHROPLASTY    . HIP ARTHROPLASTY    . STENT PLACEMENT RT URETER ([facility] HX)       Family History  Problem Relation Age of Onset  . Cancer Mother        stomach  . Asthma Father   . Stroke Neg Hx   . Heart disease Neg Hx      Social History   Tobacco Use  . Smoking status: Never Smoker  . Smokeless tobacco: Never Used  Substance Use Topics  . Alcohol use: No    Alcohol/week: 0.0 standard drinks    With staff assistance, above  reviewed with the patient today.  ROS: As per HPI, he notes he sometimes gets up once a night to urinate, sometimes more depending on what he eats and drinks, denied any pain with urination, no blood, no increased frequency, otherwise no specific complaints on a limited and focused system review   No results found for this or any previous visit (from the past 72 hour(s)).   PHQ2/9: Depression screen [facility] 2/9 11/01/2019 08/09/2019 02/06/2019 10/06/2018 08/08/2018  Decreased Interest 0 0 0 0 0  Down, Depressed, Hopeless 0 0 0 0 0  PHQ - 2 Score 0 0 0 0 0  Altered sleeping 0 0 0 - 0  Tired, decreased energy 0 0 0 - 0  Change in appetite 0 0 0 - 0  Feeling bad or failure about  yourself  0 0 0 - 0  Trouble concentrating 0 0 0 - 0  Moving slowly or fidgety/restless 0 0 0 - 0  Suicidal thoughts 0 0 0 - -  PHQ-9 Score 0 0 0 - 0  Difficult doing work/chores Not difficult at all Not difficult at all Not difficult at all - Not difficult at all   PHQ-2/9 Result is neg  Fall Risk: Fall Risk  11/01/2019 08/09/2019 02/06/2019 10/06/2018 08/08/2018  Falls in the past year? 0 0 0 0 0  Number falls in past yr: 0 0 0 0 -  Injury with Fall? 0 0 0 0 -  Follow up - Falls evaluation completed - Falls prevention discussed -      Objective:   Vitals:   11/01/19 0930  BP: 110/72  Pulse: 75  Resp: 16  Temp: 97.9 F (36.6 C)  TempSrc: Temporal  SpO2: 97%  Weight: 181 lb 8 oz (82.3 kg)  Height: 5\' 8"  (1.727 m)    Body mass index is 27.6 kg/m.  Physical Exam   NAD, masked, very pleasant  The patient appears well-groomed, he was very interactive during the exam and did not show any signs of acute distress. HEENT - [place]/AT, sclera anicteric, positive glasses, positive arcus, PERRL, EOMI, conj - non-inj'ed, TM's and canals clear with a hearing aid in place in the left ear, pharynx clear Neck - supple, no adenopathy, no TM, carotids 2+ and = without bruits bilat Car -rhythm was irregular, normal rate, + murmur (not  new and noted on recent cards assessment) Pulm- RR and effort normal at rest, CTA without wheeze or rales Abd - soft, NT, ND, BS+,  no obvious masses Back - no CVA tenderness, no low back muscle discomfort (noted can have after digging in the garden) GU -prostate exam deferred without concerning increased symptoms Skin- no rash noted on exposed areas, patient denied otherwise Ext - trace bilat LE edema, Neuro/psychiatric - affect was not flat, appropriate with conversation, very hard of hearing  Alert and oriented  Grossly non-focal - adequate strength on testing extremities, sensation intact to LT in distal extremities, DTRs 2+ and equal in the patella, uses a cane to ambulate  Able to arise from the chair and step up onto the table without assistance  Speech  normal   Results for orders placed or performed in visit on 08/08/18  Lipid panel  Result Value Ref Range   Cholesterol 171 <200 mg/dL   HDL 64 10/07/18 mg/dL   Triglycerides >96 295 mg/dL   LDL Cholesterol (Calc) 86 mg/dL (calc)   Total CHOL/HDL Ratio 2.7 <5.0 (calc)   Non-HDL Cholesterol (Calc) 107 <130 mg/dL (calc)  BASIC METABOLIC PANEL WITH GFR  Result Value Ref Range   Glucose, Bld 85 65 - 99 mg/dL   BUN 15 7 - 25 mg/dL   Creat <284 1.32 - 4.40 mg/dL   GFR, Est Non African American 80 > OR = 60 mL/min/1.49m2   GFR, Est African American 92 > OR = 60 mL/min/1.32m2   BUN/Creatinine Ratio NOT APPLICABLE 6 - 22 (calc)   Sodium 138 135 - 146 mmol/L   Potassium 4.8 3.5 - 5.3 mmol/L   Chloride 101 98 - 110 mmol/L   CO2 32 20 - 32 mmol/L   Calcium 9.3 8.6 - 10.3 mg/dL  TSH  Result Value Ref Range   TSH 3.19 0.40 - 4.50 mIU/L  CBC with Differential/Platelet  Result Value Ref Range   WBC 6.3  3.8 - 10.8 Thousand/uL   RBC 4.13 (L) 4.20 - 5.80 Million/uL   Hemoglobin 12.2 (L) 13.2 - 17.1 g/dL   HCT 02.7 (L) 25.3 - 66.4 %   MCV 89.6 80.0 - 100.0 fL   MCH 29.5 27.0 - 33.0 pg   MCHC 33.0 32.0 - 36.0 g/dL   RDW 40.3 47.4 - 25.9  %   Platelets 197 140 - 400 Thousand/uL   MPV 10.8 7.5 - 12.5 fL   Neutro Abs 4,101 1,500 - 7,800 cells/uL   Lymphs Abs 1,342 850 - 3,900 cells/uL   Absolute Monocytes 605 200 - 950 cells/uL   Eosinophils Absolute 221 15 - 500 cells/uL   Basophils Absolute 32 0 - 200 cells/uL   Neutrophils Relative % 65.1 %   Total Lymphocyte 21.3 %   Monocytes Relative 9.6 %   Eosinophils Relative 3.5 %   Basophils Relative 0.5 %  Iron, TIBC and Ferritin Panel  Result Value Ref Range   Iron 60 50 - 180 mcg/dL   TIBC 563 875 - 643 mcg/dL (calc)   %SAT 21 20 - 48 % (calc)   Ferritin 118 24 - 380 ng/mL  Retic  Result Value Ref Range   Retic Ct Pct 1.3 %   ABS Retic 53,690 25,000 - 9,000 cells/uL       Assessment & Plan:    1. Essential hypertension Blood pressure remains well controlled - COMPLETE METABOLIC PANEL WITH GFR  2. Chronic combined systolic and diastolic congestive heart failure (HCC) Continue with cardiology follow-up, with their last note reviewed  3. Arteriosclerosis of coronary artery Continue the statin Also continue with cardiology follow-up - COMPLETE METABOLIC PANEL WITH GFR  4. Mixed hyperlipidemia Continue the statin product Will recheck labs today as he very much wanted to recheck his cholesterol. Not anxious to recommend a restrictive diet at this time. - COMPLETE METABOLIC PANEL WITH GFR - Lipid panel  5. Hypothyroidism, unspecified type Recheck his lab test today Continue his thyroid supplement, and await that TSH result - TSH  6. Anemia, unspecified type We will check a CBC again, and has been mildly anemic in the recent past, with no significant changes of concern noted.  They did do an iron panel after the last check, and that was all normal. - CBC with Differential/Platelet  7. Presbycusis of both ears Is very hard of hearing, and has a hearing aid in the left ear.  He denied having 1 ever in his right ear.  He noted it is getting along well  presently and discussed potentially trying to get more help with his hearing.  8. Paroxysmal atrial fibrillation (HCC) Continue with cardiology follow-up. Continue the metoprolol and aspirin product.  9.  Mild weight loss noted   Wt Readings from Last 3 Encounters:  11/01/19 181 lb 8 oz (82.3 kg)  02/06/19 191 lb 11.2 oz (87 kg)  10/06/18 191 lb 1.6 oz (86.7 kg)  He noted has been eating well, and his BMI is still very good.  Does not seem consistent with a concerning unintentional weight loss pattern, and continue to monitor.   Await check of the labs today Follow-up again in 6 months, sooner as needed.     Jamelle Haring, MD 11/01/19 9:44 AM

## 2019-11-01 ENCOUNTER — Encounter: Payer: Self-pay | Admitting: Internal Medicine

## 2019-11-01 ENCOUNTER — Ambulatory Visit (INDEPENDENT_AMBULATORY_CARE_PROVIDER_SITE_OTHER): Payer: Medicare Other | Admitting: Internal Medicine

## 2019-11-01 ENCOUNTER — Other Ambulatory Visit: Payer: Self-pay

## 2019-11-01 VITALS — BP 110/72 | HR 75 | Temp 97.9°F | Resp 16 | Ht 68.0 in | Wt 181.5 lb

## 2019-11-01 DIAGNOSIS — I10 Essential (primary) hypertension: Secondary | ICD-10-CM

## 2019-11-01 DIAGNOSIS — E782 Mixed hyperlipidemia: Secondary | ICD-10-CM

## 2019-11-01 DIAGNOSIS — D649 Anemia, unspecified: Secondary | ICD-10-CM

## 2019-11-01 DIAGNOSIS — E039 Hypothyroidism, unspecified: Secondary | ICD-10-CM

## 2019-11-01 DIAGNOSIS — I2510 Atherosclerotic heart disease of native coronary artery without angina pectoris: Secondary | ICD-10-CM

## 2019-11-01 DIAGNOSIS — I5042 Chronic combined systolic (congestive) and diastolic (congestive) heart failure: Secondary | ICD-10-CM | POA: Diagnosis not present

## 2019-11-01 DIAGNOSIS — I480 Paroxysmal atrial fibrillation: Secondary | ICD-10-CM | POA: Diagnosis not present

## 2019-11-01 DIAGNOSIS — H9113 Presbycusis, bilateral: Secondary | ICD-10-CM

## 2019-11-01 LAB — LIPID PANEL
Cholesterol: 150 mg/dL (ref <200)
HDL: 57 mg/dL (ref >=40)
LDL Cholesterol (Calc): 69 mg/dL (calc)
Non-HDL Cholesterol (Calc): 93 mg/dL (calc) (ref <130)
Total CHOL/HDL Ratio: 2.6 (calc) (ref <5.0)
Triglycerides: 159 mg/dL — ABNORMAL HIGH (ref <150)

## 2019-11-01 LAB — COMPLETE METABOLIC PANEL WITH GFR
AG Ratio: 1.3 (calc) (ref 1.0–2.5)
ALT: 12 U/L (ref 9–46)
AST: 22 U/L (ref 10–35)
Albumin: 3.6 g/dL (ref 3.6–5.1)
Alkaline phosphatase (APISO): 64 U/L (ref 35–144)
BUN: 19 mg/dL (ref 7–25)
CO2: 30 mmol/L (ref 20–32)
Calcium: 9.8 mg/dL (ref 8.6–10.3)
Chloride: 101 mmol/L (ref 98–110)
Creat: 1.05 mg/dL (ref 0.70–1.11)
GFR, Est African American: 71 mL/min/{1.73_m2} (ref >=60)
GFR, Est Non African American: 61 mL/min/{1.73_m2} (ref >=60)
Globulin: 2.7 g/dL (calc) (ref 1.9–3.7)
Glucose, Bld: 99 mg/dL (ref 65–99)
Potassium: 4.9 mmol/L (ref 3.5–5.3)
Sodium: 138 mmol/L (ref 135–146)
Total Bilirubin: 0.5 mg/dL (ref 0.2–1.2)
Total Protein: 6.3 g/dL (ref 6.1–8.1)

## 2019-11-01 LAB — CBC WITH DIFFERENTIAL/PLATELET
Absolute Monocytes: 586 cells/uL (ref 200–950)
Basophils Absolute: 43 cells/uL (ref 0–200)
Basophils Relative: 0.7 %
Eosinophils Absolute: 201 cells/uL (ref 15–500)
Eosinophils Relative: 3.3 %
HCT: 39.8 % (ref 38.5–50.0)
Hemoglobin: 12.5 g/dL — ABNORMAL LOW (ref 13.2–17.1)
Lymphs Abs: 1366 cells/uL (ref 850–3900)
MCH: 28.8 pg (ref 27.0–33.0)
MCHC: 31.4 g/dL — ABNORMAL LOW (ref 32.0–36.0)
MCV: 91.7 fL (ref 80.0–100.0)
MPV: 11.1 fL (ref 7.5–12.5)
Monocytes Relative: 9.6 %
Neutro Abs: 3904 cells/uL (ref 1500–7800)
Neutrophils Relative %: 64 %
Platelets: 199 10*3/uL (ref 140–400)
RBC: 4.34 10*6/uL (ref 4.20–5.80)
RDW: 13.7 % (ref 11.0–15.0)
Total Lymphocyte: 22.4 %
WBC: 6.1 10*3/uL (ref 3.8–10.8)

## 2019-11-01 LAB — TSH: TSH: 3.95 mIU/L (ref 0.40–4.50)

## 2020-01-02 ENCOUNTER — Telehealth: Payer: Self-pay | Admitting: Internal Medicine

## 2020-01-02 NOTE — Chronic Care Management (AMB) (Signed)
  Chronic Care Management   Outreach Note  01/02/2020 Name: Kirk Sanchez MRN: [id number] DOB: October 12, 1924  Kirk Sanchez is a 86 y.o. year old male who is a primary care patient of Jamelle Haring, MD. I reached out to [name] by phone today in response to a referral sent by Mr. Kirk Sanchez's health plan.     An unsuccessful telephone outreach was attempted today. The patient was referred to the case management team for assistance with care management and care coordination.   Follow Up Plan: A HIPPA compliant phone message was left for the patient providing contact information and requesting a return call.  The care management team will reach out to the patient again over the next 7 days.  If patient returns call to provider office, please advise to call Embedded Care Management Care Guide Penne Lash  at [phone]  Penne Lash, RMA Care Guide, Embedded Care Coordination [facility]  [name], [place] [address] Direct Dial: [phone] [email or web address]: [email or web address]

## 2020-01-07 NOTE — Chronic Care Management (AMB) (Signed)
  Chronic Care Management   Outreach Note  01/07/2020 Name: Kirk Sanchez MRN: [id number] DOB: 1924-11-15  Kirk Sanchez is a 86 y.o. year old male who is a primary care patient of Jamelle Haring, MD. I reached out to Kirk Sanchez by phone today in response to a referral sent by Kirk Sanchez health plan.     A second unsuccessful telephone outreach was attempted today. The patient was referred to the case management team for assistance with care management and care coordination.   Follow Up Plan: A HIPPA compliant phone message was left for the patient providing contact information and requesting a return call.  The care management team will reach out to the patient again over the next 7 days.  If patient returns call to provider office, please advise to call Embedded Care Management Care Guide Penne Lash * at [phone]*  Penne Lash, RMA Care Guide, Embedded Care Coordination [facility]  [place], [place] [address] Direct Dial: [phone] [email or web address] Website: [email or web address]

## 2020-01-08 NOTE — Telephone Encounter (Signed)
Patient returned call [facility] # [phone]

## 2020-01-09 NOTE — Chronic Care Management (AMB) (Signed)
  Chronic Care Management   Note  01/09/2020 Name: [name] MRN: [id number] DOB: January 18, 1925  Kirk Sanchez is a 86 y.o. year old male who is a primary care patient of Towanda Malkin, MD. I reached out to [name] by phone today in response to a referral sent by Mr. Jacquelyne Balint Depoy's health plan.     Mr. Jenniges was given information about Chronic Care Management services today including:  1. CCM service includes personalized support from designated clinical staff supervised by his physician, including individualized plan of care and coordination with other care providers 2. 24/7 contact phone numbers for assistance for urgent and routine care needs. 3. Service will only be billed when office clinical staff spend 20 minutes or more in a month to coordinate care. 4. Only one practitioner may furnish and bill the service in a calendar month. 5. The patient may stop CCM services at any time (effective at the end of the month) by phone call to the office staff. 6. The patient will be responsible for cost sharing (co-pay) of up to 20% of the service fee (after annual deductible is met).  Patient was very hard of hearing and did not fully understand what I was saying. I reach out to RN CM to see if she could mail patient info regarding CCM services, so that he could better understand. Patient wishes to consider information provided and/or speak with a member of the care team before deciding about enrollment in care management services.   Follow up plan: The care management team is available to follow up with the patient after provider conversation with the patient regarding recommendation for care management engagement and subsequent re-referral to the care management team.   Noreene Larsson, [place], [place], [place] [address] Direct Dial: [phone] [email or web address]

## 2020-01-18 ENCOUNTER — Other Ambulatory Visit: Payer: Self-pay | Admitting: Family Medicine

## 2020-01-18 DIAGNOSIS — E032 Hypothyroidism due to medicaments and other exogenous substances: Secondary | ICD-10-CM

## 2020-02-11 DIAGNOSIS — I480 Paroxysmal atrial fibrillation: Secondary | ICD-10-CM | POA: Diagnosis not present

## 2020-02-11 DIAGNOSIS — I10 Essential (primary) hypertension: Secondary | ICD-10-CM | POA: Diagnosis not present

## 2020-02-11 DIAGNOSIS — I2510 Atherosclerotic heart disease of native coronary artery without angina pectoris: Secondary | ICD-10-CM | POA: Diagnosis not present

## 2020-02-11 DIAGNOSIS — R600 Localized edema: Secondary | ICD-10-CM | POA: Diagnosis not present

## 2020-02-11 DIAGNOSIS — E782 Mixed hyperlipidemia: Secondary | ICD-10-CM | POA: Diagnosis not present

## 2020-02-11 DIAGNOSIS — I5032 Chronic diastolic (congestive) heart failure: Secondary | ICD-10-CM | POA: Diagnosis not present

## 2020-02-11 DIAGNOSIS — I255 Ischemic cardiomyopathy: Secondary | ICD-10-CM | POA: Diagnosis not present

## 2020-02-11 DIAGNOSIS — R011 Cardiac murmur, unspecified: Secondary | ICD-10-CM | POA: Diagnosis not present

## 2020-02-11 DIAGNOSIS — E079 Disorder of thyroid, unspecified: Secondary | ICD-10-CM | POA: Diagnosis not present

## 2020-04-15 ENCOUNTER — Other Ambulatory Visit: Payer: Self-pay | Admitting: Family Medicine

## 2020-04-15 DIAGNOSIS — E785 Hyperlipidemia, unspecified: Secondary | ICD-10-CM

## 2020-04-16 ENCOUNTER — Other Ambulatory Visit: Payer: Self-pay

## 2020-04-16 DIAGNOSIS — I10 Essential (primary) hypertension: Secondary | ICD-10-CM

## 2020-04-16 MED ORDER — METOPROLOL SUCCINATE ER 50 MG PO TB24: 50.0000 mg | ORAL_TABLET | Freq: Every day | ORAL | 1 refills | Status: DC

## 2020-05-01 NOTE — Progress Notes (Signed)
Patient ID: Kirk Sanchez, male    DOB: 05-Jun-1925, 86 y.o.   MRN: [id number]  PCP: Jamelle Haring, MD  Chief Complaint  Patient presents with  . Follow-up    Subjective:   Kirk Sanchez is a 86 y.o. male, presents to clinic with CC of the following:  Chief Complaint  Patient presents with  . Follow-up    HPI:  Patient is a 86 year old male Last visit was 11/01/2019 Follows up today  He noted he feels great, has no complaints. He noted he still lives alone, and drives, and is doing well. Is very hard of hearing, and had to talk very loudly to communicate  His last follow-up was with cardiology on 02/11/2020, after having couple falls of concern, with the following assessment/plan noted:  Kirk Sanchez is a 86 y.o.male patient with a PMH significant for CAD, MI s/p PCI with BMS stent placement to LAD (1996), HTN, thyroid disease, hyperlipidemia, paroxysmal atrial fibrillation, heart murmur CHF with systolic and diastolic dysfunction with EF=40-45% and Cardiomyopathy that presents today for routine follow up. The patient reports having two mechanical falls and is now utilizing a straight cane for ambulatory assistance. The incidences do not appear to be cardiac-related; however, we will continue to assess for symptoms of dizziness or syncope. The patient is noted to have mild right lower extremity edema without pain, erythema or evidence of trauma. The patient appears fairly well and is in no apparent distress today.   Plan  1. Falls, recurrent, fairly stable -The patient was thoroughly educated on fall prevention and the importance of following fall precautions as well as the importance of utilizing the straight cane or walker for ambulatory assistance and seeking medical evaluation when a fall occurs. It was also recommended to the patient that he allow someone to help him with his yard work. He verbalized understanding of all information. -Recommend following up with PCP  as scheduled.  2. Right lower extremity edema likely due to recent trauma from fall, recently stable, patient denies any pain at the site and the site is also free of erythema or signs of hematoma -Recommend elevating the extremity while sitting.  3. CAD s/p PCI with BMS placement, reasonably stable -We will continue management with aspirin, metoprolol and simvastatin.  4. CHF, reasonably stable, most recent EF = 45-50 % -We will continue management with metoprolol.  5. Murmur, reasonably stable -We will continue conservative management at this time.  6. Ischemic cardiomyopathy, reasonably stable, previous echocardiogram reveals moderate LVH -We will continue conservative management at the time. -We will consider scheduling a repeat echocardiogram at the next visit.  7. Paroxysmal atrial fibrillation, reasonably controlled, rate controlled, patient not on anticoagulation -We will continue aspirin therapy.  8. Hypertension, reasonable control, patient is normotensive on today -We will continue management with metoprolol.  9. Hyperlipidemia, reasonably controlled, most recent lipid profile reveals total cholesterol = 150, HDL = 57, triglycerides = 297 (H), LDL = 69 -We will continue management with simvastatin. -We will consider adding fenofibrate at the next visit due to hypertriglyceridemia.  10. Thyroid disease, recently stable -Management per PCP with levothyroxine; recommend continuing current therapy.  Return in about 6 months (around 08/13/2020). Discussed return precautions with patient for acute and chronic medical issues.   intermittent GERD over the past few years, he noted he still gets occasional reflux symptoms, although no recent increased concerns.   Hypertension, CHF & Paroxysmal atrial fibrillation Patient is onmetoprolol 50mg  daily. Follows  up with Dr. Juliann Pares- cardiologyas above noted.  He noted that his heart rate is irregular and has been that way for  years BP Readings from Last 3 Encounters:  05/02/20 134/90  11/01/19 110/72  02/06/19 118/70   Denies chest pain, SOB, increased headaches, blurry vision.  Hyperlipidemia Medication regimen-simvastatin 40mg  daily. Takes medications as prescribed    Lab Results  Component Value Date   CHOL 150 11/01/2019   HDL 57 11/01/2019   LDLCALC 69 11/01/2019   TRIG 159 (H) 11/01/2019   CHOLHDL 2.6 11/01/2019     Hypothyroidism Patient rx levothryoxine69mcg daily. Has been on this dose formanyyears. Lab Results  Component Value Date   TSH 3.95 11/01/2019    Patient denies increased fatigue, worsening constipation/diarrhea and noted he does take an occasional laxative,  Overweight Wt Readings from Last 3 Encounters:  05/02/20 182 lb (82.6 kg)  11/01/19 181 lb 8 oz (82.3 kg)  02/06/19 191 lb 11.2 oz (87 kg)   Weight has been stable in the recent past  Mild Anemia - stable over past years noted, iron panel normal last check Lab Results  Component Value Date   WBC 6.1 11/01/2019   HGB 12.5 (L) 11/01/2019   HCT 39.8 11/01/2019   MCV 91.7 11/01/2019   PLT 199 11/01/2019   Lab Results  Component Value Date   IRON 60 08/08/2018   TIBC 283 08/08/2018   FERRITIN 118 08/08/2018                He denied any bleeding concerns, with no bleeding per rectum or dark black stools   The patient lives at home alone but has 3 sons that check in on him at times.  He states that he is able to perform his activities of daily living without assistance and that he continues to drive.  He denies missing medication doses.   The patient is very hard of hearing but was a good historian and able to answer all of the questions regarding his health.      Patient Active Problem List   Diagnosis Date Noted  . Senile purpura (HCC) 02/06/2019  . DNR (do not resuscitate)   . Palliative care by specialist   . Congestive heart failure (HCC)   . Hyponatremia 09/10/2017  . Right shoulder  tendonitis 03/08/2017  . Atrial fibrillation (HCC) 03/06/2016  . Presbycusis of both ears 03/06/2016  . Anemia 03/03/2016  . Arteriosclerosis of coronary artery 01/28/2015  . Cardiac murmur 01/28/2015  . HLD (hyperlipidemia) 01/28/2015  . BP (high blood pressure) 01/28/2015  . Hypothyroidism 01/28/2015      Current Outpatient Medications:  .  aspirin 81 MG tablet, Take 81 mg by mouth daily., Disp: , Rfl:  .  Calcium Carb-Cholecalciferol (CALCIUM-VITAMIN D) 500-200 MG-UNIT tablet, Take 1 tablet by mouth 2 (two) times daily. , Disp: , Rfl:  .  levothyroxine (SYNTHROID) 25 MCG tablet, TAKE 1 TABLET BY MOUTH ONCE DAILY ON AN EMPTY STOMACH. WAIT 30 MINUTES BEFORE TAKING OTHER MEDS., Disp: 90 tablet, Rfl: 3 .  metoprolol succinate (TOPROL-XL) 50 MG 24 hr tablet, Take 1 tablet (50 mg total) by mouth daily. Take with or immediately following a meal., Disp: 90 tablet, Rfl: 1 .  simvastatin (ZOCOR) 40 MG tablet, TAKE 1 TABLET BY MOUTH AT BEDTIME, Disp: 90 tablet, Rfl: 1 .  Elastic Bandages & Supports (MEDICAL COMPRESSION STOCKINGS) MISC, Put on both legs in the morning, take off at night; do NOT sleep in them (Patient not  taking: Reported on 05/02/2020), Disp: 2 each, Rfl: 1   No Known Allergies   Past Surgical History:  Procedure Laterality Date  . HAND ARTHROPLASTY    . HIP ARTHROPLASTY    . STENT PLACEMENT RT URETER ([facility] HX)       Family History  Problem Relation Age of Onset  . Cancer Mother        stomach  . Asthma Father   . Stroke Neg Hx   . Heart disease Neg Hx      Social History   Tobacco Use  . Smoking status: Never Smoker  . Smokeless tobacco: Never Used  Substance Use Topics  . Alcohol use: No    Alcohol/week: 0.0 standard drinks    With staff assistance, above reviewed with the patient today.  ROS: As per HPI, otherwise no specific complaints on a limited and focused system review   No results found for this or any previous visit (from the past 72  hour(s)).   PHQ2/9: Depression screen [facility] 2/9 05/02/2020 11/01/2019 08/09/2019 02/06/2019 10/06/2018  Decreased Interest 0 0 0 0 0  Down, Depressed, Hopeless 0 0 0 0 0  PHQ - 2 Score 0 0 0 0 0  Altered sleeping - 0 0 0 -  Tired, decreased energy - 0 0 0 -  Change in appetite - 0 0 0 -  Feeling bad or failure about yourself  - 0 0 0 -  Trouble concentrating - 0 0 0 -  Moving slowly or fidgety/restless - 0 0 0 -  Suicidal thoughts - 0 0 0 -  PHQ-9 Score - 0 0 0 -  Difficult doing work/chores - Not difficult at all Not difficult at all Not difficult at all -   PHQ-2/9 Result is neg  Fall Risk: Fall Risk  05/02/2020 11/01/2019 08/09/2019 02/06/2019 10/06/2018  Falls in the past year? 0 0 0 0 0  Number falls in past yr: 0 0 0 0 0  Injury with Fall? 0 0 0 0 0  Risk for fall due to : Impaired balance/gait - - - -  Follow up Falls evaluation completed - Falls evaluation completed - Falls prevention discussed      Objective:   Vitals:   05/02/20 0857  BP: 134/90  Pulse: 67  Resp: 16  Temp: (!) 97.4 F (36.3 C)  TempSrc: Oral  SpO2: 100%  Weight: 182 lb (82.6 kg)  Height: 5\' 8"  (1.727 m)    Body mass index is 27.67 kg/m.   Physical Exam   NAD, masked, very pleasant, hard of hearing  The patient appears well-groomed, he was very interactive during the exam and did not show any signs of acute distress. HEENT - [place]/AT, sclera anicteric, positive glasses, positive arcus, PERRL, EOMI, conj - non-inj'ed, a hearing aid in place in the left ear, pharynx clear Neck - supple, no adenopathy,  carotids 2+ and = without bruits bilat Car -rhythm was irregular, normal rate, + murmur (not new and noted on recent cards assessment) Pulm- RR and effort normal at rest, CTA without wheeze or rales Abd - soft, NT diffusely,  Back - no CVA tenderness,  Ext - trace bilat LE edema, with slight indentations noted on the upper sock line bilateral Neuro/psychiatric - affect was not flat, appropriate with  conversation, very hard of hearing             Alert              Grossly  non-focal              Able to arise from the chair and step up onto the table without assistance, uses a cane to help with support when ambulates             Speech  normal   Results for orders placed or performed in visit on 11/01/19  COMPLETE METABOLIC PANEL WITH GFR  Result Value Ref Range   Glucose, Bld 99 65 - 99 mg/dL   BUN 19 7 - 25 mg/dL   Creat 1.61 0.96 - 0.45 mg/dL   GFR, Est Non African American 61 > OR = 60 mL/min/1.90m2   GFR, Est African American 71 > OR = 60 mL/min/1.40m2   BUN/Creatinine Ratio NOT APPLICABLE 6 - 22 (calc)   Sodium 138 135 - 146 mmol/L   Potassium 4.9 3.5 - 5.3 mmol/L   Chloride 101 98 - 110 mmol/L   CO2 30 20 - 32 mmol/L   Calcium 9.8 8.6 - 10.3 mg/dL   Total Protein 6.3 6.1 - 8.1 g/dL   Albumin 3.6 3.6 - 5.1 g/dL   Globulin 2.7 1.9 - 3.7 g/dL (calc)   AG Ratio 1.3 1.0 - 2.5 (calc)   Total Bilirubin 0.5 0.2 - 1.2 mg/dL   Alkaline phosphatase (APISO) 64 35 - 144 U/L   AST 22 10 - 35 U/L   ALT 12 9 - 46 U/L  CBC with Differential/Platelet  Result Value Ref Range   WBC 6.1 3.8 - 10.8 Thousand/uL   RBC 4.34 4.20 - 5.80 Million/uL   Hemoglobin 12.5 (L) 13.2 - 17.1 g/dL   HCT 40.9 38 - 50 %   MCV 91.7 80.0 - 100.0 fL   MCH 28.8 27.0 - 33.0 pg   MCHC 31.4 (L) 32.0 - 36.0 g/dL   RDW 81.1 91.4 - 78.2 %   Platelets 199 140 - 400 Thousand/uL   MPV 11.1 7.5 - 12.5 fL   Neutro Abs 3,904 1,500 - 7,800 cells/uL   Lymphs Abs 1,366 850 - 3,900 cells/uL   Absolute Monocytes 586 200 - 950 cells/uL   Eosinophils Absolute 201 15 - 500 cells/uL   Basophils Absolute 43 0 - 200 cells/uL   Neutrophils Relative % 64 %   Total Lymphocyte 22.4 %   Monocytes Relative 9.6 %   Eosinophils Relative 3.3 %   Basophils Relative 0.7 %  Lipid panel  Result Value Ref Range   Cholesterol 150 <200 mg/dL   HDL 57 > OR = 40 mg/dL   Triglycerides 956 (H) <150 mg/dL   LDL Cholesterol (Calc) 69  mg/dL (calc)   Total CHOL/HDL Ratio 2.6 <5.0 (calc)   Non-HDL Cholesterol (Calc) 93 <213 mg/dL (calc)  TSH  Result Value Ref Range   TSH 3.95 0.40 - 4.50 mIU/L   Last labs reviewed Assessment & Plan:    1. Essential hypertension Blood pressure has remained relatively stable in the recent past, with a blind-ending noted today. Continues to gain cardiology input to help in management Continue the metoprolol presently.  2. Chronic combined systolic and diastolic congestive heart failure (HCC) Continues to follow with cardiology  3. Arteriosclerosis of coronary artery Remains on a statin product.  4. Mixed hyperlipidemia Remains on a statin product, and has been tolerating to date.  5. Hypothyroidism, unspecified type Remains on a low-dose of a thyroid supplement. Last TSH check was good and monitoring  6. Anemia, unspecified type Has been stable in the recent past.  7. Presbycusis of both ears Has significant hearing loss, and has a hearing aid in the left ear presently.   8. Paroxysmal atrial fibrillation (HCC) Continue to follow with cardiology.  9. Need for influenza vaccination  - Flu Vaccine QUAD High Dose(Fluad)   All in all, he noted he continues to feel very well. Discussed concerns with potential fall risk and recent fall concerns, and the importance of being very careful when getting up and about, and has a cane to help with support. We will not repeat labs today, and tentatively schedule a follow-up in 4 months time, follow-up sooner as needed.   Jamelle HaringLIFFORD D Cing , MD 05/02/20 9:03 AM

## 2020-05-02 ENCOUNTER — Other Ambulatory Visit: Payer: Self-pay

## 2020-05-02 ENCOUNTER — Encounter: Payer: Self-pay | Admitting: Internal Medicine

## 2020-05-02 ENCOUNTER — Ambulatory Visit (INDEPENDENT_AMBULATORY_CARE_PROVIDER_SITE_OTHER): Payer: Medicare Other | Admitting: Internal Medicine

## 2020-05-02 VITALS — BP 134/90 | HR 67 | Temp 97.4°F | Resp 16 | Ht 68.0 in | Wt 182.0 lb

## 2020-05-02 DIAGNOSIS — D649 Anemia, unspecified: Secondary | ICD-10-CM

## 2020-05-02 DIAGNOSIS — Z23 Encounter for immunization: Secondary | ICD-10-CM | POA: Diagnosis not present

## 2020-05-02 DIAGNOSIS — H9113 Presbycusis, bilateral: Secondary | ICD-10-CM | POA: Diagnosis not present

## 2020-05-02 DIAGNOSIS — E039 Hypothyroidism, unspecified: Secondary | ICD-10-CM | POA: Diagnosis not present

## 2020-05-02 DIAGNOSIS — I2510 Atherosclerotic heart disease of native coronary artery without angina pectoris: Secondary | ICD-10-CM

## 2020-05-02 DIAGNOSIS — I480 Paroxysmal atrial fibrillation: Secondary | ICD-10-CM | POA: Diagnosis not present

## 2020-05-02 DIAGNOSIS — I5042 Chronic combined systolic (congestive) and diastolic (congestive) heart failure: Secondary | ICD-10-CM

## 2020-05-02 DIAGNOSIS — I10 Essential (primary) hypertension: Secondary | ICD-10-CM

## 2020-05-02 DIAGNOSIS — E782 Mixed hyperlipidemia: Secondary | ICD-10-CM

## 2020-05-07 DIAGNOSIS — Z23 Encounter for immunization: Secondary | ICD-10-CM | POA: Diagnosis not present

## 2020-05-13 ENCOUNTER — Telehealth: Payer: Self-pay | Admitting: Internal Medicine

## 2020-05-13 NOTE — Telephone Encounter (Signed)
Copied from CRM [id number]. Topic: Medicare AWV >> May 13, 2020 12:48 PM Claudette Laws R wrote: Reason for CRM:   Left message for patient to call back and schedule the Medicare Annual Wellness Visit (AWV) in office or virtual  Last AWV 10/06/2018  Please schedule at anytime with [facility] Advisor.  40 minute appointment  Any questions, please contact me at [phone]

## 2020-07-15 ENCOUNTER — Ambulatory Visit (INDEPENDENT_AMBULATORY_CARE_PROVIDER_SITE_OTHER): Payer: Medicare Other

## 2020-07-15 ENCOUNTER — Other Ambulatory Visit: Payer: Self-pay

## 2020-07-15 VITALS — BP 130/72 | HR 70 | Temp 97.7°F | Resp 16 | Ht 68.0 in | Wt 180.6 lb

## 2020-07-15 DIAGNOSIS — Z0000 Encounter for general adult medical examination without abnormal findings: Secondary | ICD-10-CM | POA: Diagnosis not present

## 2020-07-15 NOTE — Progress Notes (Signed)
Subjective:   Kirk Sanchez is a 86 y.o. male who presents for Medicare Annual/Subsequent preventive examination.  Review of Systems     Cardiac Risk Factors include: advanced age (>57men, >37 women);dyslipidemia;hypertension;male gender;sedentary lifestyle     Objective:    Today's Vitals   07/15/20 1450  BP: 130/72  Pulse: 70  Resp: 16  Temp: 97.7 F (36.5 C)  TempSrc: Oral  SpO2: 99%  Weight: 180 lb 9.6 oz (81.9 kg)  Height: 5\' 8"  (1.727 m)   Body mass index is 27.46 kg/m.  Advanced Directives 07/15/2020 10/06/2018 09/10/2017 03/08/2017 09/08/2016 04/08/2016 03/03/2016  Does Patient Have a Medical Advance Directive? Yes Yes No Yes Yes No Yes  Type of 05/03/2016 of [place];Living will Living will;Healthcare Power of Attorney - - Living will;Healthcare Power of Attorney - Living will  Copy of Healthcare Power of Attorney in Chart? No - copy requested No - copy requested - - - - No - copy requested  Would patient like information on creating a medical advance directive? - - No - Patient declined - - - -    Current Medications (verified) Outpatient Encounter Medications as of 07/15/2020  Medication Sig  . aspirin 81 MG tablet Take 81 mg by mouth daily.  . Calcium Carb-Cholecalciferol (CALCIUM-VITAMIN Sanchez) 500-200 MG-UNIT tablet Take 1 tablet by mouth 2 (two) times daily.   07/17/2020 Bandages & Supports (MEDICAL COMPRESSION STOCKINGS) MISC Put on both legs in the morning, take off at night; do NOT sleep in them  . levothyroxine (SYNTHROID) 25 MCG tablet TAKE 1 TABLET BY MOUTH ONCE DAILY ON AN EMPTY STOMACH. WAIT 30 MINUTES BEFORE TAKING OTHER MEDS.  . metoprolol succinate (TOPROL-XL) 50 MG 24 hr tablet Take 1 tablet (50 mg total) by mouth daily. Take with or immediately following a meal.  . simvastatin (ZOCOR) 40 MG tablet TAKE 1 TABLET BY MOUTH AT BEDTIME   No facility-administered encounter medications on file as of 07/15/2020.    Allergies  (verified) Patient has no known allergies.   History: Past Medical History:  Diagnosis Date  . Atrial fibrillation (HCC)   . Hyperlipidemia   . Hypertension   . Kidney stone   . Presbycusis of both ears 03/06/2016  . Thyroid disease    Past Surgical History:  Procedure Laterality Date  . HAND ARTHROPLASTY    . HIP ARTHROPLASTY    . STENT PLACEMENT RT URETER ([facility] HX)     Family History  Problem Relation Age of Onset  . Cancer Mother        stomach  . Asthma Father   . Stroke Neg Hx   . Heart disease Neg Hx    Social History   Socioeconomic History  . Marital status: Widowed    Spouse name: Not on file  . Number of children: 3  . Years of education: Not on file  . Highest education level: High school graduate  Occupational History  . Occupation: Retired  Tobacco Use  . Smoking status: Never Smoker  . Smokeless tobacco: Never Used  Vaping Use  . Vaping Use: Never used  Substance and Sexual Activity  . Alcohol use: No    Alcohol/week: 0.0 standard drinks  . Drug use: No  . Sexual activity: Not Currently  Other Topics Concern  . Not on file  Social History Narrative   Pt lives alone.    Social Determinants of Health   Financial Resource Strain: Low Risk   . Difficulty of  Paying Living Expenses: Not hard at all  Food Insecurity: No Food Insecurity  . Worried About Programme researcher, broadcasting/film/videounning Out of Food in the Last Year: Never true  . Ran Out of Food in the Last Year: Never true  Transportation Needs: No Transportation Needs  . Lack of Transportation (Medical): No  . Lack of Transportation (Non-Medical): No  Physical Activity: Inactive  . Days of Exercise per Week: 0 days  . Minutes of Exercise per Session: 0 min  Stress: No Stress Concern Present  . Feeling of Stress : Not at all  Social Connections: Moderately Isolated  . Frequency of Communication with Friends and Family: More than three times a week  . Frequency of Social Gatherings with Friends and Family: More than  three times a week  . Attends Religious Services: More than 4 times per year  . Active Member of Clubs or Organizations: No  . Attends BankerClub or Organization Meetings: Never  . Marital Status: Widowed    Tobacco Counseling Counseling given: Not Answered   Clinical Intake:  Pre-visit preparation completed: Yes  Pain : No/denies pain     BMI - recorded: 27.46 Nutritional Status: BMI 25 -29 Overweight Nutritional Risks: None Diabetes: No  How often do you need to have someone help you when you read instructions, pamphlets, or other written materials from your doctor or pharmacy?: 1 - Never    Interpreter Needed?: No  Information entered by :: Kirk LittlerKasey Brittian Renaldo LPN   Activities of Daily Living In your present state of health, do you have any difficulty performing the following activities: 07/15/2020 05/02/2020  Hearing? Kirk JohnsY Y  Comment wears hearing aids -  Vision? N N  Difficulty concentrating or making decisions? N N  Walking or climbing stairs? Y Y  Dressing or bathing? N N  Doing errands, shopping? N N  Preparing Food and eating ? N -  Using the Toilet? N -  In the past six months, have you accidently leaked urine? N -  Do you have problems with loss of bowel control? N -  Managing your Medications? N -  Managing your Finances? N -  Housekeeping or managing your Housekeeping? N -  Some recent data might be hidden    Patient Care Team: Kirk Sanchez, Kirk D, MD as PCP - General (Internal Medicine) Kirk Sanchez, Kirk D, MD as Consulting Physician (Cardiology)  Indicate any recent Medical Services you may have received from other than [facility] providers in the past year (date may be approximate).     Assessment:   This is a routine wellness examination for [facility].  Hearing/Vision screen  Hearing Screening   125Hz  250Hz  500Hz  1000Hz  2000Hz  3000Hz  4000Hz  6000Hz  8000Hz   Right ear:           Left ear:           Comments: Wears hearing aids  Vision Screening Comments:  [facility]   Dietary issues and exercise activities discussed: Current Exercise Habits: The patient does not participate in regular exercise at present, Exercise limited by: orthopedic condition(s)  Goals    . DIET - INCREASE WATER INTAKE     Recommend drinking 6-8 glasses of water per day      Depression Screen PHQ 2/9 Scores 07/15/2020 05/02/2020 11/01/2019 08/09/2019 02/06/2019 10/06/2018 08/08/2018  PHQ - 2 Score 0 0 0 0 0 0 0  PHQ- 9 Score - - 0 0 0 - 0    Fall Risk Fall Risk  07/15/2020  05/02/2020 11/01/2019 08/09/2019 02/06/2019  Falls in the past year? 0 0 0 0 0  Number falls in past yr: 0 0 0 0 0  Injury with Fall? 0 0 0 0 0  Risk for fall due to : Impaired balance/gait Impaired balance/gait - - -  Follow up Falls prevention discussed Falls evaluation completed - Falls evaluation completed -    FALL RISK PREVENTION PERTAINING TO THE HOME:  Any stairs in or around the home? Yes  If so, are there any without handrails? No  Home free of loose throw rugs in walkways, pet beds, electrical cords, etc? Yes  Adequate lighting in your home to reduce risk of falls? Yes   ASSISTIVE DEVICES UTILIZED TO PREVENT FALLS:  Life alert? No  Use of a cane, walker or w/c? Yes  Grab bars in the bathroom? Yes  Shower chair or bench in shower? No  Elevated toilet seat or a handicapped toilet? No   TIMED UP AND GO:  Was the test performed? Yes .  Length of time to ambulate 10 feet: 10 sec.   Gait slow and steady with assistive device  Cognitive Function:     6CIT Screen 10/06/2018  What Year? 4 points  What month? 0 points  What time? 0 points  Count back from 20 0 points  Months in reverse 4 points  Repeat phrase 2 points  Total Score 10    Immunizations Immunization History  Administered Date(s) Administered  . Fluad Quad(high Dose 65+) 05/02/2020  . Influenza, High Dose Seasonal PF 06/09/2015, 09/08/2016, 09/08/2017, 08/08/2018  . PFIZER  SARS-COV-2 Vaccination 08/18/2019, 09/10/2019  . Pneumococcal Conjugate-13 09/26/2014  . Pneumococcal Polysaccharide-23 06/13/2012  . Tdap 02/02/2011    TDAP status: Up to date  Flu Vaccine status: Up to date  Pneumococcal vaccine status: Up to date  Covid-19 vaccine status: Completed vaccines; pt advised to bring vaccine record to next appt for boost vaccine information  Qualifies for Shingles Vaccine? Yes   Zostavax completed No   Shingrix Completed?: No.    Education has been provided regarding the importance of this vaccine. Patient has been advised to call insurance company to determine out of pocket expense if they have not yet received this vaccine. Advised may also receive vaccine at local pharmacy or Health Dept. Verbalized acceptance and understanding.  Screening Tests Health Maintenance  Topic Date Due  . COVID-19 Vaccine (3 - Booster for Pfizer series) 03/09/2020  . TETANUS/TDAP  02/01/2021  . INFLUENZA VACCINE  Completed  . PNA vac Low Risk Adult  Completed    Health Maintenance  Health Maintenance Due  Topic Date Due  . COVID-19 Vaccine (3 - Booster for Pfizer series) 03/09/2020    Colorectal cancer screening: No longer required.   Lung Cancer Screening: (Low Dose CT Chest recommended if Age 39-80 years, 30 pack-year currently smoking OR have quit w/in 15years.) does not qualify.   Additional Screening:  Hepatitis C Screening: does not qualify  Vision Screening: Recommended annual ophthalmology exams for early detection of glaucoma and other disorders of the eye. Is the patient up to date with their annual eye exam?  No  Who is the provider or what is the name of the office in which the patient attends annual eye exams? [facility]  Dental Screening: Recommended annual dental exams for proper oral hygiene  Community Resource Referral / Chronic Care Management: CRR required this visit?  No   CCM required this visit?  No  Plan:     I  have personally reviewed and noted the following in the patient's chart:   . Medical and social history . Use of alcohol, tobacco or illicit drugs  . Current medications and supplements . Functional ability and status . Nutritional status . Physical activity . Advanced directives . List of other physicians . Hospitalizations, surgeries, and ER visits in previous 12 months . Vitals . Screenings to include cognitive, depression, and falls . Referrals and appointments  In addition, I have reviewed and discussed with patient certain preventive protocols, quality metrics, and best practice recommendations. A written personalized care plan for preventive services as well as general preventive health recommendations were provided to patient.     Kirk Littler, LPN   [id number]   Nurse Notes: none

## 2020-07-15 NOTE — Patient Instructions (Signed)
Kirk Sanchez , Thank you for taking time to come for your Medicare Wellness Visit. I appreciate your ongoing commitment to your health goals. Please review the following plan we discussed and let me know if I can assist you in the future.   Screening recommendations/referrals: Colonoscopy: No longer required Recommended yearly ophthalmology/optometry visit for glaucoma screening and checkup Recommended yearly dental visit for hygiene and checkup  Vaccinations: Influenza vaccine: done 05/02/20 Pneumococcal vaccine: done 09/26/14 Tdap vaccine: done 02/02/11 Shingles vaccine: Shingrix discussed. Please contact your pharmacy for coverage information.  Covid-19:  Done 08/18/19 & 09/10/19; please bring your vaccine record with your booster vaccine to your next appt  Advanced directives: Please bring a copy of your health care power of attorney and living will to the office at your convenience.  Conditions/risks identified: Recommend drinking 6-8 glasses of water per day   Next appointment: Follow up in one year for your annual wellness visit.   Preventive Care 2 Years and Older, Male Preventive care refers to lifestyle choices and visits with your health care provider that can promote health and wellness. What does preventive care include?  A yearly physical exam. This is also called an annual well check.  Dental exams once or twice a year.  Routine eye exams. Ask your health care provider how often you should have your eyes checked.  Personal lifestyle choices, including:  Daily care of your teeth and gums.  Regular physical activity.  Eating a healthy diet.  Avoiding tobacco and drug use.  Limiting alcohol use.  Practicing safe sex.  Taking low doses of aspirin every day.  Taking vitamin and mineral supplements as recommended by your health care provider. What happens during an annual well check? The services and screenings done by your health care provider during your annual well  check will depend on your age, overall health, lifestyle risk factors, and family history of disease. Counseling  Your health care provider may ask you questions about your:  Alcohol use.  Tobacco use.  Drug use.  Emotional well-being.  Home and relationship well-being.  Sexual activity.  Eating habits.  History of falls.  Memory and ability to understand (cognition).  Work and work Astronomer. Screening  You may have the following tests or measurements:  Height, weight, and BMI.  Blood pressure.  Lipid and cholesterol levels. These may be checked every 5 years, or more frequently if you are over 33 years old.  Skin check.  Lung cancer screening. You may have this screening every year starting at age 52 if you have a 30-pack-year history of smoking and currently smoke or have quit within the past 15 years.  Fecal occult blood test (FOBT) of the stool. You may have this test every year starting at age 53.  Flexible sigmoidoscopy or colonoscopy. You may have a sigmoidoscopy every 5 years or a colonoscopy every 10 years starting at age 36.  Prostate cancer screening. Recommendations will vary depending on your family history and other risks.  Hepatitis C blood test.  Hepatitis B blood test.  Sexually transmitted disease (STD) testing.  Diabetes screening. This is done by checking your blood sugar (glucose) after you have not eaten for a while (fasting). You may have this done every 1-3 years.  Abdominal aortic aneurysm (AAA) screening. You may need this if you are a current or former smoker.  Osteoporosis. You may be screened starting at age 28 if you are at high risk. Talk with your health care provider about your  test results, treatment options, and if necessary, the need for more tests. Vaccines  Your health care provider may recommend certain vaccines, such as:  Influenza vaccine. This is recommended every year.  Tetanus, diphtheria, and acellular  pertussis (Tdap, Td) vaccine. You may need a Td booster every 10 years.  Zoster vaccine. You may need this after age 27.  Pneumococcal 13-valent conjugate (PCV13) vaccine. One dose is recommended after age 37.  Pneumococcal polysaccharide (PPSV23) vaccine. One dose is recommended after age 23. Talk to your health care provider about which screenings and vaccines you need and how often you need them. This information is not intended to replace advice given to you by your health care provider. Make sure you discuss any questions you have with your health care provider. Document Released: 08/15/2015 Document Revised: 04/07/2016 Document Reviewed: 05/20/2015 Elsevier Interactive Patient Education  2017 [place] Prevention in the Home Falls can cause injuries. They can happen to people of all ages. There are many things you can do to make your home safe and to help prevent falls. What can I do on the outside of my home?  Regularly fix the edges of walkways and driveways and fix any cracks.  Remove anything that might make you trip as you walk through a door, such as a raised step or threshold.  Trim any bushes or trees on the path to your home.  Use bright outdoor lighting.  Clear any walking paths of anything that might make someone trip, such as rocks or tools.  Regularly check to see if handrails are loose or broken. Make sure that both sides of any steps have handrails.  Any raised decks and porches should have guardrails on the edges.  Have any leaves, snow, or ice cleared regularly.  Use sand or salt on walking paths during winter.  Clean up any spills in your garage right away. This includes oil or grease spills. What can I do in the bathroom?  Use night lights.  Install grab bars by the toilet and in the tub and shower. Do not use towel bars as grab bars.  Use non-skid mats or decals in the tub or shower.  If you need to sit down in the shower, use a plastic,  non-slip stool.  Keep the floor dry. Clean up any water that spills on the floor as soon as it happens.  Remove soap buildup in the tub or shower regularly.  Attach bath mats securely with double-sided non-slip rug tape.  Do not have throw rugs and other things on the floor that can make you trip. What can I do in the bedroom?  Use night lights.  Make sure that you have a light by your bed that is easy to reach.  Do not use any sheets or blankets that are too big for your bed. They should not hang down onto the floor.  Have a firm chair that has side arms. You can use this for support while you get dressed.  Do not have throw rugs and other things on the floor that can make you trip. What can I do in the kitchen?  Clean up any spills right away.  Avoid walking on wet floors.  Keep items that you use a lot in easy-to-reach places.  If you need to reach something above you, use a strong step stool that has a grab bar.  Keep electrical cords out of the way.  Do not use floor polish or wax that  makes floors slippery. If you must use wax, use non-skid floor wax.  Do not have throw rugs and other things on the floor that can make you trip. What can I do with my stairs?  Do not leave any items on the stairs.  Make sure that there are handrails on both sides of the stairs and use them. Fix handrails that are broken or loose. Make sure that handrails are as long as the stairways.  Check any carpeting to make sure that it is firmly attached to the stairs. Fix any carpet that is loose or worn.  Avoid having throw rugs at the top or bottom of the stairs. If you do have throw rugs, attach them to the floor with carpet tape.  Make sure that you have a light switch at the top of the stairs and the bottom of the stairs. If you do not have them, ask someone to add them for you. What else can I do to help prevent falls?  Wear shoes that:  Do not have high heels.  Have rubber  bottoms.  Are comfortable and fit you well.  Are closed at the toe. Do not wear sandals.  If you use a stepladder:  Make sure that it is fully opened. Do not climb a closed stepladder.  Make sure that both sides of the stepladder are locked into place.  Ask someone to hold it for you, if possible.  Clearly mark and make sure that you can see:  Any grab bars or handrails.  First and last steps.  Where the edge of each step is.  Use tools that help you move around (mobility aids) if they are needed. These include:  Canes.  Walkers.  Scooters.  Crutches.  Turn on the lights when you go into a dark area. Replace any light bulbs as soon as they burn out.  Set up your furniture so you have a clear path. Avoid moving your furniture around.  If any of your floors are uneven, fix them.  If there are any pets around you, be aware of where they are.  Review your medicines with your doctor. Some medicines can make you feel dizzy. This can increase your chance of falling. Ask your doctor what other things that you can do to help prevent falls. This information is not intended to replace advice given to you by your health care provider. Make sure you discuss any questions you have with your health care provider. Document Released: 05/15/2009 Document Revised: 12/25/2015 Document Reviewed: 08/23/2014 Elsevier Interactive Patient Education  2017 [employer].

## 2020-09-02 ENCOUNTER — Ambulatory Visit: Payer: Medicare Other | Admitting: Internal Medicine

## 2020-11-28 ENCOUNTER — Telehealth: Payer: Self-pay

## 2020-11-28 DIAGNOSIS — E032 Hypothyroidism due to medicaments and other exogenous substances: Secondary | ICD-10-CM

## 2020-11-28 NOTE — Telephone Encounter (Signed)
Pt needs an appt

## 2020-12-04 NOTE — Progress Notes (Signed)
Name: Kirk Sanchez   MRN: [id number]    DOB: [birth year]   Date:12/05/2020       Progress Note  Subjective  Chief Complaint  Follow Up  HPI  CHF/Afib/HTN: he is compliant with his medications, he denies side effects. Denies chest pain, palpitation and only has SOB with moderate activity. He lives alone and is able to do all ADL and takes care of his own yard. He has three sons. One of them lives near by. He denies orthopnea, has very mild swelling of legs but stable. Sees Dr. Juliann Pares, last visit 01/2020 on aspirin only, likely due to risk of falls   Hypothyroidism: he gets labs done yearly, taking same dose of a long time, no change in bowel movements of dysphagia.   Hearing loss: has hearing loss but had to remove my mask so he could read my lips\  Senile purpura: on arms, he states stable, reassurance given   Atherosclerosis of aorta: taking statin therapy, denies side effects, reviewed CT chest done 2017   GERD: taking medication prn, states not having problems  Patient Active Problem List   Diagnosis Date Noted  . CHF (congestive heart failure), NYHA class II, chronic, diastolic (HCC) 08/09/2019  . Ischemic cardiomyopathy 08/09/2019  . Senile purpura (HCC) 02/06/2019  . DNR (do not resuscitate)   . Palliative care by specialist   . Congestive heart failure (HCC)   . Hyponatremia 09/10/2017  . Right shoulder tendonitis 03/08/2017  . Atrial fibrillation (HCC) 03/06/2016  . Presbycusis of both ears 03/06/2016  . Anemia 03/03/2016  . Arteriosclerosis of coronary artery 01/28/2015  . Cardiac murmur 01/28/2015  . HLD (hyperlipidemia) 01/28/2015  . BP (high blood pressure) 01/28/2015  . Hypothyroidism 01/28/2015    Past Surgical History:  Procedure Laterality Date  . HAND ARTHROPLASTY    . HIP ARTHROPLASTY    . STENT PLACEMENT RT URETER ([facility] HX)      Family History  Problem Relation Age of Onset  . Cancer Mother        stomach  . Asthma Father   . Stroke Neg Hx    . Heart disease Neg Hx     Social History   Tobacco Use  . Smoking status: Never Smoker  . Smokeless tobacco: Never Used  Substance Use Topics  . Alcohol use: No    Alcohol/week: 0.0 standard drinks     Current Outpatient Medications:  .  aspirin 81 MG tablet, Take 81 mg by mouth daily., Disp: , Rfl:  .  Calcium Carb-Cholecalciferol (CALCIUM-VITAMIN D) 500-200 MG-UNIT tablet, Take 1 tablet by mouth 2 (two) times daily. , Disp: , Rfl:  .  Elastic Bandages & Supports (MEDICAL COMPRESSION STOCKINGS) MISC, Put on both legs in the morning, take off at night; do NOT sleep in them, Disp: 2 each, Rfl: 1 .  levothyroxine (SYNTHROID) 25 MCG tablet, TAKE 1 TABLET BY MOUTH ONCE DAILY ON AN EMPTY STOMACH. WAIT 30 MINUTES BEFORE TAKING OTHER MEDS., Disp: 90 tablet, Rfl: 3 .  metoprolol succinate (TOPROL-XL) 50 MG 24 hr tablet, Take 1 tablet (50 mg total) by mouth daily. Take with or immediately following a meal., Disp: 90 tablet, Rfl: 1 .  simvastatin (ZOCOR) 40 MG tablet, TAKE 1 TABLET BY MOUTH AT BEDTIME, Disp: 90 tablet, Rfl: 1  No Known Allergies  I personally reviewed active problem list, medication list, allergies, family history, social history, health maintenance with the patient/caregiver today.   ROS  Constitutional: Negative for fever or weight  change.  Respiratory: Negative for cough and shortness of breath.   Cardiovascular: Negative for chest pain or palpitations.  Gastrointestinal: Negative for abdominal pain, no bowel changes.  Musculoskeletal: positive  for gait problem but no  joint swelling.  Skin: Negative for rash.  Neurological: Negative for dizziness or headache.  No other specific complaints in a complete review of systems (except as listed in HPI above).  Objective  Vitals:   12/05/20 1337  BP: 118/84  Pulse: 84  Resp: 16  Temp: 97.7 F (36.5 C)  TempSrc: Oral  SpO2: 99%  Weight: 180 lb (81.6 kg)  Height: 5\' 7"  (1.702 m)    Body mass index is 28.19  kg/m.  Physical Exam  Constitutional: Patient appears well-developed and well-nourished. No distress.  HEENT: head atraumatic, normocephalic, pupils equal and reactive to light, ears hearing aids, neck supple, throat within normal limits Cardiovascular: Normal rate, irregular rhythm and normal heart sounds.  No murmur heard.Trace  BLE edema. Pulmonary/Chest: Effort normal and breath sounds normal. No respiratory distress. Abdominal: Soft.  There is no tenderness. Skin: senile purpura on arms  Psychiatric: Patient has a normal mood and affect. behavior is normal. Judgment and thought content normal.  PHQ2/9: Depression screen [facility] 2/9 12/05/2020 07/15/2020 05/02/2020 11/01/2019 08/09/2019  Decreased Interest 0 0 0 0 0  Down, Depressed, Hopeless 0 0 0 0 0  PHQ - 2 Score 0 0 0 0 0  Altered sleeping - - - 0 0  Tired, decreased energy - - - 0 0  Change in appetite - - - 0 0  Feeling bad or failure about yourself  - - - 0 0  Trouble concentrating - - - 0 0  Moving slowly or fidgety/restless - - - 0 0  Suicidal thoughts - - - 0 0  PHQ-9 Score - - - 0 0  Difficult doing work/chores - - - Not difficult at all Not difficult at all    phq 9 is negative   Fall Risk: Fall Risk  12/05/2020 07/15/2020 05/02/2020 11/01/2019 08/09/2019  Falls in the past year? 0 0 0 0 0  Number falls in past yr: 0 0 0 0 0  Injury with Fall? 0 0 0 0 0  Risk for fall due to : - Impaired balance/gait Impaired balance/gait - -  Follow up - Falls prevention discussed Falls evaluation completed - Falls evaluation completed    Functional Status Survey: Is the patient deaf or have difficulty hearing?: Yes Does the patient have difficulty seeing, even when wearing glasses/contacts?: No Does the patient have difficulty concentrating, remembering, or making decisions?: No Does the patient have difficulty walking or climbing stairs?: Yes Does the patient have difficulty dressing or bathing?: No Does the patient have difficulty  doing errands alone such as visiting a doctor's office or shopping?: No  Lives alone, still drives, cooks and lives alone   Assessment & Plan  1. Chronic combined systolic and diastolic congestive heart failure (HCC)  - COMPLETE METABOLIC PANEL WITH GFR - CBC with Differential/Platelet  2. Paroxysmal atrial fibrillation (HCC)  - COMPLETE METABOLIC PANEL WITH GFR  3. Atherosclerosis of aorta (HCC)  - Lipid panel  4. Hypothyroidism, unspecified type  - TSH  5. GERD without esophagitis   6. Senile purpura (HCC)  - CBC with Differential/Platelet  7. Presbycusis of both ears

## 2020-12-05 ENCOUNTER — Other Ambulatory Visit: Payer: Self-pay

## 2020-12-05 ENCOUNTER — Ambulatory Visit (INDEPENDENT_AMBULATORY_CARE_PROVIDER_SITE_OTHER): Payer: Medicare Other | Admitting: Family Medicine

## 2020-12-05 ENCOUNTER — Encounter: Payer: Self-pay | Admitting: Family Medicine

## 2020-12-05 VITALS — BP 118/84 | HR 84 | Temp 97.7°F | Resp 16 | Ht 67.0 in | Wt 180.0 lb

## 2020-12-05 DIAGNOSIS — K219 Gastro-esophageal reflux disease without esophagitis: Secondary | ICD-10-CM | POA: Diagnosis not present

## 2020-12-05 DIAGNOSIS — I480 Paroxysmal atrial fibrillation: Secondary | ICD-10-CM | POA: Diagnosis not present

## 2020-12-05 DIAGNOSIS — E039 Hypothyroidism, unspecified: Secondary | ICD-10-CM | POA: Diagnosis not present

## 2020-12-05 DIAGNOSIS — I700 Atherosclerosis of aorta: Secondary | ICD-10-CM | POA: Diagnosis not present

## 2020-12-05 DIAGNOSIS — H9113 Presbycusis, bilateral: Secondary | ICD-10-CM | POA: Diagnosis not present

## 2020-12-05 DIAGNOSIS — I5042 Chronic combined systolic (congestive) and diastolic (congestive) heart failure: Secondary | ICD-10-CM | POA: Diagnosis not present

## 2020-12-05 DIAGNOSIS — I2510 Atherosclerotic heart disease of native coronary artery without angina pectoris: Secondary | ICD-10-CM | POA: Diagnosis not present

## 2020-12-05 DIAGNOSIS — D692 Other nonthrombocytopenic purpura: Secondary | ICD-10-CM | POA: Diagnosis not present

## 2020-12-08 ENCOUNTER — Other Ambulatory Visit: Payer: Self-pay

## 2020-12-08 DIAGNOSIS — D649 Anemia, unspecified: Secondary | ICD-10-CM

## 2020-12-08 LAB — COMPLETE METABOLIC PANEL WITH GFR
AG Ratio: 1.6 (calc) (ref 1.0–2.5)
ALT: 10 U/L (ref 9–46)
AST: 18 U/L (ref 10–35)
Albumin: 3.8 g/dL (ref 3.6–5.1)
Alkaline phosphatase (APISO): 59 U/L (ref 35–144)
BUN: 15 mg/dL (ref 7–25)
CO2: 30 mmol/L (ref 20–32)
Calcium: 9.1 mg/dL (ref 8.6–10.3)
Chloride: 101 mmol/L (ref 98–110)
Creat: 0.84 mg/dL (ref 0.70–1.11)
GFR, Est African American: 87 mL/min/{1.73_m2} (ref >=60)
GFR, Est Non African American: 75 mL/min/{1.73_m2} (ref >=60)
Globulin: 2.4 g/dL (calc) (ref 1.9–3.7)
Glucose, Bld: 84 mg/dL (ref 65–139)
Potassium: 4.5 mmol/L (ref 3.5–5.3)
Sodium: 139 mmol/L (ref 135–146)
Total Bilirubin: 0.7 mg/dL (ref 0.2–1.2)
Total Protein: 6.2 g/dL (ref 6.1–8.1)

## 2020-12-08 LAB — TSH: TSH: 4.14 mIU/L (ref 0.40–4.50)

## 2020-12-08 LAB — IRON,TIBC AND FERRITIN PANEL
%SAT: 22 % (calc) (ref 20–48)
Ferritin: 78 ng/mL (ref 24–380)
Iron: 57 ug/dL (ref 50–180)
TIBC: 259 mcg/dL (calc) (ref 250–425)

## 2020-12-08 LAB — CBC WITH DIFFERENTIAL/PLATELET
Absolute Monocytes: 563 cells/uL (ref 200–950)
Basophils Absolute: 17 cells/uL (ref 0–200)
Basophils Relative: 0.3 %
Eosinophils Absolute: 151 cells/uL (ref 15–500)
Eosinophils Relative: 2.6 %
HCT: 36.8 % — ABNORMAL LOW (ref 38.5–50.0)
Hemoglobin: 11.6 g/dL — ABNORMAL LOW (ref 13.2–17.1)
Lymphs Abs: 1247 cells/uL (ref 850–3900)
MCH: 28 pg (ref 27.0–33.0)
MCHC: 31.5 g/dL — ABNORMAL LOW (ref 32.0–36.0)
MCV: 88.7 fL (ref 80.0–100.0)
MPV: 11 fL (ref 7.5–12.5)
Monocytes Relative: 9.7 %
Neutro Abs: 3822 cells/uL (ref 1500–7800)
Neutrophils Relative %: 65.9 %
Platelets: 181 10*3/uL (ref 140–400)
RBC: 4.15 10*6/uL — ABNORMAL LOW (ref 4.20–5.80)
RDW: 14.2 % (ref 11.0–15.0)
Total Lymphocyte: 21.5 %
WBC: 5.8 10*3/uL (ref 3.8–10.8)

## 2020-12-08 LAB — LIPID PANEL
Cholesterol: 140 mg/dL (ref <200)
HDL: 57 mg/dL (ref >=40)
LDL Cholesterol (Calc): 64 mg/dL (calc)
Non-HDL Cholesterol (Calc): 83 mg/dL (calc) (ref <130)
Total CHOL/HDL Ratio: 2.5 (calc) (ref <5.0)
Triglycerides: 106 mg/dL (ref <150)

## 2020-12-08 LAB — TEST AUTHORIZATION

## 2020-12-08 NOTE — Progress Notes (Unsigned)
iron

## 2021-01-08 ENCOUNTER — Encounter: Payer: Self-pay | Admitting: Internal Medicine

## 2021-01-08 ENCOUNTER — Inpatient Hospital Stay
Admission: EM | Admit: 2021-01-08 | Discharge: 2021-01-16 | DRG: 291 | Disposition: A | Discharge status: Home Health | Payer: Medicare Other | Attending: Internal Medicine | Admitting: Internal Medicine

## 2021-01-08 ENCOUNTER — Ambulatory Visit
Admission: EM | Admit: 2021-01-08 | Discharge: 2021-01-08 | Disposition: A | Discharge status: Other Acute Inpatient Hospital | Payer: Medicare Other | Attending: Emergency Medicine | Admitting: Emergency Medicine

## 2021-01-08 ENCOUNTER — Ambulatory Visit: Payer: Self-pay | Admitting: *Deleted

## 2021-01-08 ENCOUNTER — Encounter: Payer: Self-pay | Admitting: Emergency Medicine

## 2021-01-08 ENCOUNTER — Emergency Department: Payer: Medicare Other

## 2021-01-08 ENCOUNTER — Other Ambulatory Visit: Payer: Self-pay

## 2021-01-08 DIAGNOSIS — I248 Other forms of acute ischemic heart disease: Secondary | ICD-10-CM | POA: Diagnosis present

## 2021-01-08 DIAGNOSIS — Z87442 Personal history of urinary calculi: Secondary | ICD-10-CM | POA: Diagnosis not present

## 2021-01-08 DIAGNOSIS — R609 Edema, unspecified: Secondary | ICD-10-CM | POA: Diagnosis not present

## 2021-01-08 DIAGNOSIS — I459 Conduction disorder, unspecified: Secondary | ICD-10-CM | POA: Diagnosis present

## 2021-01-08 DIAGNOSIS — R079 Chest pain, unspecified: Secondary | ICD-10-CM | POA: Diagnosis not present

## 2021-01-08 DIAGNOSIS — I4891 Unspecified atrial fibrillation: Secondary | ICD-10-CM

## 2021-01-08 DIAGNOSIS — R7989 Other specified abnormal findings of blood chemistry: Secondary | ICD-10-CM

## 2021-01-08 DIAGNOSIS — R0602 Shortness of breath: Secondary | ICD-10-CM | POA: Diagnosis present

## 2021-01-08 DIAGNOSIS — R0789 Other chest pain: Secondary | ICD-10-CM | POA: Diagnosis not present

## 2021-01-08 DIAGNOSIS — K7460 Unspecified cirrhosis of liver: Secondary | ICD-10-CM | POA: Diagnosis not present

## 2021-01-08 DIAGNOSIS — R531 Weakness: Secondary | ICD-10-CM | POA: Diagnosis not present

## 2021-01-08 DIAGNOSIS — I5032 Chronic diastolic (congestive) heart failure: Secondary | ICD-10-CM | POA: Diagnosis present

## 2021-01-08 DIAGNOSIS — J90 Pleural effusion, not elsewhere classified: Secondary | ICD-10-CM | POA: Diagnosis not present

## 2021-01-08 DIAGNOSIS — I5023 Acute on chronic systolic (congestive) heart failure: Secondary | ICD-10-CM | POA: Diagnosis not present

## 2021-01-08 DIAGNOSIS — Z20822 Contact with and (suspected) exposure to covid-19: Secondary | ICD-10-CM | POA: Diagnosis not present

## 2021-01-08 DIAGNOSIS — I82452 Acute embolism and thrombosis of left peroneal vein: Secondary | ICD-10-CM | POA: Diagnosis not present

## 2021-01-08 DIAGNOSIS — Z79899 Other long term (current) drug therapy: Secondary | ICD-10-CM | POA: Diagnosis not present

## 2021-01-08 DIAGNOSIS — H9113 Presbycusis, bilateral: Secondary | ICD-10-CM | POA: Diagnosis present

## 2021-01-08 DIAGNOSIS — I517 Cardiomegaly: Secondary | ICD-10-CM | POA: Diagnosis not present

## 2021-01-08 DIAGNOSIS — I82492 Acute embolism and thrombosis of other specified deep vein of left lower extremity: Secondary | ICD-10-CM | POA: Diagnosis not present

## 2021-01-08 DIAGNOSIS — I509 Heart failure, unspecified: Secondary | ICD-10-CM

## 2021-01-08 DIAGNOSIS — I5042 Chronic combined systolic (congestive) and diastolic (congestive) heart failure: Secondary | ICD-10-CM | POA: Diagnosis present

## 2021-01-08 DIAGNOSIS — H9193 Unspecified hearing loss, bilateral: Secondary | ICD-10-CM | POA: Diagnosis not present

## 2021-01-08 DIAGNOSIS — D649 Anemia, unspecified: Secondary | ICD-10-CM | POA: Diagnosis not present

## 2021-01-08 DIAGNOSIS — E871 Hypo-osmolality and hyponatremia: Secondary | ICD-10-CM | POA: Diagnosis present

## 2021-01-08 DIAGNOSIS — R0600 Dyspnea, unspecified: Secondary | ICD-10-CM | POA: Diagnosis not present

## 2021-01-08 DIAGNOSIS — Z96649 Presence of unspecified artificial hip joint: Secondary | ICD-10-CM | POA: Diagnosis present

## 2021-01-08 DIAGNOSIS — I480 Paroxysmal atrial fibrillation: Secondary | ICD-10-CM | POA: Diagnosis present

## 2021-01-08 DIAGNOSIS — Z7982 Long term (current) use of aspirin: Secondary | ICD-10-CM

## 2021-01-08 DIAGNOSIS — R2240 Localized swelling, mass and lump, unspecified lower limb: Secondary | ICD-10-CM | POA: Diagnosis not present

## 2021-01-08 DIAGNOSIS — R0609 Other forms of dyspnea: Secondary | ICD-10-CM | POA: Diagnosis not present

## 2021-01-08 DIAGNOSIS — I110 Hypertensive heart disease with heart failure: Principal | ICD-10-CM | POA: Diagnosis present

## 2021-01-08 DIAGNOSIS — I5043 Acute on chronic combined systolic (congestive) and diastolic (congestive) heart failure: Secondary | ICD-10-CM | POA: Diagnosis present

## 2021-01-08 DIAGNOSIS — Z79890 Hormone replacement therapy (postmenopausal): Secondary | ICD-10-CM

## 2021-01-08 DIAGNOSIS — Z7189 Other specified counseling: Secondary | ICD-10-CM | POA: Diagnosis not present

## 2021-01-08 DIAGNOSIS — M25571 Pain in right ankle and joints of right foot: Secondary | ICD-10-CM

## 2021-01-08 DIAGNOSIS — Z974 Presence of external hearing-aid: Secondary | ICD-10-CM

## 2021-01-08 DIAGNOSIS — E039 Hypothyroidism, unspecified: Secondary | ICD-10-CM | POA: Diagnosis not present

## 2021-01-08 DIAGNOSIS — E785 Hyperlipidemia, unspecified: Secondary | ICD-10-CM | POA: Diagnosis not present

## 2021-01-08 DIAGNOSIS — R0902 Hypoxemia: Secondary | ICD-10-CM | POA: Diagnosis not present

## 2021-01-08 DIAGNOSIS — J9811 Atelectasis: Secondary | ICD-10-CM | POA: Diagnosis not present

## 2021-01-08 DIAGNOSIS — Z9181 History of falling: Secondary | ICD-10-CM | POA: Diagnosis not present

## 2021-01-08 LAB — RESP PANEL BY RT-PCR (FLU A&B, COVID) ARPGX2
Influenza A by PCR: NEGATIVE
Influenza B by PCR: NEGATIVE
SARS Coronavirus 2 by RT PCR: NEGATIVE

## 2021-01-08 LAB — COMPREHENSIVE METABOLIC PANEL
ALT: 12 U/L (ref 0–44)
AST: 25 U/L (ref 15–41)
Albumin: 3.5 g/dL (ref 3.5–5.0)
Alkaline Phosphatase: 60 U/L (ref 38–126)
Anion gap: 10 (ref 5–15)
BUN: 17 mg/dL (ref 8–23)
CO2: 27 mmol/L (ref 22–32)
Calcium: 9.1 mg/dL (ref 8.9–10.3)
Chloride: 95 mmol/L — ABNORMAL LOW (ref 98–111)
Creatinine, Ser: 0.81 mg/dL (ref 0.61–1.24)
GFR, Estimated: >60 mL/min (ref >60)
Glucose, Bld: 124 mg/dL — ABNORMAL HIGH (ref 70–99)
Potassium: 4 mmol/L (ref 3.5–5.1)
Sodium: 132 mmol/L — ABNORMAL LOW (ref 135–145)
Total Bilirubin: 0.9 mg/dL (ref 0.3–1.2)
Total Protein: 6.3 g/dL — ABNORMAL LOW (ref 6.5–8.1)

## 2021-01-08 LAB — CBC WITH DIFFERENTIAL/PLATELET
Abs Immature Granulocytes: 0.03 10*3/uL (ref 0.00–0.07)
Basophils Absolute: 0 10*3/uL (ref 0.0–0.1)
Basophils Relative: 0 %
Eosinophils Absolute: 0 10*3/uL (ref 0.0–0.5)
Eosinophils Relative: 0 %
HCT: 36.3 % — ABNORMAL LOW (ref 39.0–52.0)
Hemoglobin: 11.6 g/dL — ABNORMAL LOW (ref 13.0–17.0)
Immature Granulocytes: 0 %
Lymphocytes Relative: 13 %
Lymphs Abs: 1 10*3/uL (ref 0.7–4.0)
MCH: 28.7 pg (ref 26.0–34.0)
MCHC: 32 g/dL (ref 30.0–36.0)
MCV: 89.9 fL (ref 80.0–100.0)
Monocytes Absolute: 0.8 10*3/uL (ref 0.1–1.0)
Monocytes Relative: 11 %
Neutro Abs: 5.7 10*3/uL (ref 1.7–7.7)
Neutrophils Relative %: 76 %
Platelets: 204 10*3/uL (ref 150–400)
RBC: 4.04 MIL/uL — ABNORMAL LOW (ref 4.22–5.81)
RDW: 15.8 % — ABNORMAL HIGH (ref 11.5–15.5)
WBC: 7.6 10*3/uL (ref 4.0–10.5)
nRBC: 0 % (ref 0.0–0.2)

## 2021-01-08 LAB — BRAIN NATRIURETIC PEPTIDE: B Natriuretic Peptide: 220 pg/mL — ABNORMAL HIGH (ref 0.0–100.0)

## 2021-01-08 LAB — TROPONIN I (HIGH SENSITIVITY)
Troponin I (High Sensitivity): 69 ng/L — ABNORMAL HIGH (ref <18)
Troponin I (High Sensitivity): 82 ng/L — ABNORMAL HIGH (ref <18)

## 2021-01-08 LAB — TSH: TSH: 3.314 u[IU]/mL (ref 0.350–4.500)

## 2021-01-08 LAB — LIPID PANEL
Cholesterol: 188 mg/dL (ref 0–200)
HDL: 67 mg/dL (ref >40)
LDL Cholesterol: 105 mg/dL — ABNORMAL HIGH (ref 0–99)
Total CHOL/HDL Ratio: 2.8 RATIO
Triglycerides: 80 mg/dL (ref <150)
VLDL: 16 mg/dL (ref 0–40)

## 2021-01-08 LAB — T4, FREE: Free T4: 1.14 ng/dL — ABNORMAL HIGH (ref 0.61–1.12)

## 2021-01-08 LAB — MAGNESIUM: Magnesium: 1.9 mg/dL (ref 1.7–2.4)

## 2021-01-08 MED ORDER — ACETAMINOPHEN 325 MG PO TABS: 650.0000 mg | ORAL_TABLET | Freq: Four times a day (QID) | ORAL | Status: DC | PRN

## 2021-01-08 MED ORDER — ONDANSETRON HCL 4 MG PO TABS: 4.0000 mg | ORAL_TABLET | Freq: Four times a day (QID) | ORAL | Status: DC | PRN

## 2021-01-08 MED ORDER — LEVOTHYROXINE SODIUM 25 MCG PO TABS
25.0000 ug | ORAL_TABLET | Freq: Every day | ORAL | Status: DC
  Administered 2021-01-09 – 2021-01-16 (×8): 25 ug via ORAL
  Filled 2021-01-08 (×8): qty 1

## 2021-01-08 MED ORDER — ASPIRIN 81 MG PO CHEW
243.0000 mg | CHEWABLE_TABLET | Freq: Once | ORAL | Status: AC
  Administered 2021-01-08: 243 mg via ORAL

## 2021-01-08 MED ORDER — ACETAMINOPHEN 650 MG RE SUPP: 650.0000 mg | Freq: Four times a day (QID) | RECTAL | Status: DC | PRN

## 2021-01-08 MED ORDER — SIMVASTATIN 20 MG PO TABS
40.0000 mg | ORAL_TABLET | Freq: Every day | ORAL | Status: DC
  Administered 2021-01-08 – 2021-01-15 (×8): 40 mg via ORAL
  Filled 2021-01-08 (×4): qty 2
  Filled 2021-01-08: qty 4
  Filled 2021-01-08 (×3): qty 2

## 2021-01-08 MED ORDER — METOPROLOL SUCCINATE ER 50 MG PO TB24
50.0000 mg | ORAL_TABLET | Freq: Every day | ORAL | Status: DC
  Administered 2021-01-08: 50 mg via ORAL
  Filled 2021-01-08: qty 1

## 2021-01-08 MED ORDER — ASPIRIN 81 MG PO CHEW
81.0000 mg | CHEWABLE_TABLET | Freq: Every day | ORAL | Status: DC
  Administered 2021-01-09: 81 mg via ORAL
  Filled 2021-01-08: qty 1

## 2021-01-08 MED ORDER — ONDANSETRON HCL 4 MG/2ML IJ SOLN: 4.0000 mg | Freq: Four times a day (QID) | INTRAMUSCULAR | Status: DC | PRN

## 2021-01-08 MED ORDER — FUROSEMIDE 10 MG/ML IJ SOLN
60.0000 mg | Freq: Once | INTRAMUSCULAR | Status: AC
  Administered 2021-01-08: 60 mg via INTRAVENOUS
  Filled 2021-01-08: qty 8

## 2021-01-08 MED ORDER — HEPARIN SODIUM (PORCINE) 5000 UNIT/ML IJ SOLN
5000.0000 [IU] | Freq: Three times a day (TID) | INTRAMUSCULAR | Status: DC
  Administered 2021-01-08 – 2021-01-09 (×2): 5000 [IU] via SUBCUTANEOUS
  Filled 2021-01-08 (×2): qty 1

## 2021-01-08 MED ORDER — PANTOPRAZOLE SODIUM 40 MG IV SOLR
40.0000 mg | Freq: Every day | INTRAVENOUS | Status: DC
  Administered 2021-01-08 – 2021-01-11 (×4): 40 mg via INTRAVENOUS
  Filled 2021-01-08 (×4): qty 40

## 2021-01-08 NOTE — H&P (Addendum)
History and Physical    Kirk HeadsHoward H Tiger ZOX:[id number] DOB: 01/11/1925 DOA: 01/08/2021  [facility]    Patient coming from:  Home   Chief Complaint:  Fall and le edema.    HPI: Kirk Sanchez is a 87 y.o. male seen in ed with complaints of  fall 2 weeks ago and hurt his rt ankle and has been progressively dwelling and now left ankle is swollen as well. Pt also reports having midsternal chest discomfort. Pt came via ems from urgent care. EKG shows a.fib rvr and pt has h/o PAF, pt is not on any NOAC.Edema is symmetric 2 + both edema.HPI is limited due to pt's hearing issue.   Pt has past medical history of PAF, Hyperlipidemia, HTN, Kidney stone, CHF, Hypothyroidism hearing impaired, and DNR.  ED Course:  Vitals:   01/08/21 1600 01/08/21 1615 01/08/21 1630 01/08/21 1700  BP: (!) 119/93  138/88 130/90  Pulse: 92  (!) 42 (!) 53  Resp: (!) 25  (!) 24 19  Temp:  97.9 F (36.6 C)    TempSrc:  Oral    SpO2: 97%  99% 100%  Weight:      Height:      In the emergency room patient is alert awake afebrile oriented hypertensive tachycardic and hypoxic initially.  CMP shows mild hyponatremia, glucose of 124, magnesium of 1.9, normal electrolytes otherwise normal LFTs and normal kidney function.  BNP of 220, troponin of 69.  CBC shows 0.6, white blood cell count of 7.6, platelets of 204.  CBC is pretty similar to previous CBCs.  Respiratory panel is pending.  Chest x-ray shows mild bibasilar atelectasis. Initial EKG shows A. fib RVR at the rate of 107,initially trop of 69 and we will follow.  Last echo was in 2019: Study Conclusions: - Left ventricle: Wall thickness was increased in a pattern of    moderate LVH. Systolic function was mildly to moderately reduced.    The estimated ejection fraction was in the range of 40% to 45%.  - Aortic valve: There was mild regurgitation.  - Mitral valve: There was mild to moderate regurgitation.  - Left atrium: The atrium was mildly  dilated.  - Right ventricle: The cavity size was mildly dilated. Wall    thickness was mildly to moderately increased. There was mild    hypertrophy.  - Right atrium: The atrium was moderately dilated.   Review of Systems:  Review of Systems  Unable to perform ROS: Other (Difficulty with hearing but pt denies any complaints except for leg swelling.)    Past Medical History:  Diagnosis Date   Atrial fibrillation (HCC)    Hyperlipidemia    Hypertension    Kidney stone    Presbycusis of both ears 03/06/2016   Thyroid disease     Past Surgical History:  Procedure Laterality Date   HAND ARTHROPLASTY     HIP ARTHROPLASTY     STENT PLACEMENT RT URETER ([facility] HX)       reports that he has never smoked. He has never used smokeless tobacco. He reports that he does not drink alcohol and does not use drugs.  No Known Allergies  Family History  Problem Relation Age of Onset   Cancer Mother        stomach   Asthma Father    Stroke Neg Hx    Heart disease Neg Hx     Prior to Admission medications   Medication Sig Start Date End Date  Taking? Authorizing Provider  aspirin 81 MG tablet Take 81 mg by mouth daily.    [provider]  Calcium Carb-Cholecalciferol (CALCIUM-VITAMIN D) 500-200 MG-UNIT tablet Take 1 tablet by mouth 2 (two) times daily.     [provider]  Elastic Bandages & Supports (MEDICAL COMPRESSION STOCKINGS) MISC Put on both legs in the morning, take off at night; do NOT sleep in them 11/04/17   Lada, Janit Bern, MD  levothyroxine (SYNTHROID) 25 MCG tablet TAKE 1 TABLET BY MOUTH ONCE DAILY ON AN EMPTY STOMACH. WAIT 30 MINUTES BEFORE TAKING OTHER MEDS. 01/18/20   Danelle Berry, PA-C  metoprolol succinate (TOPROL-XL) 50 MG 24 hr tablet Take 1 tablet (50 mg total) by mouth daily. Take with or immediately following a meal. 04/16/20   Jamelle Haring, MD  simvastatin (ZOCOR) 40 MG tablet TAKE 1 TABLET BY MOUTH AT BEDTIME 04/15/20   Jamelle Haring, MD     Physical Exam: Vitals:   01/08/21 1600 01/08/21 1615 01/08/21 1630 01/08/21 1700  BP: (!) 119/93  138/88 130/90  Pulse: 92  (!) 42 (!) 53  Resp: (!) 25  (!) 24 19  Temp:  97.9 F (36.6 C)    TempSrc:  Oral    SpO2: 97%  99% 100%  Weight:      Height:       Physical Exam Vitals and nursing note reviewed.  Constitutional:      General: He is not in acute distress.    Appearance: Normal appearance. He is not ill-appearing, toxic-appearing or diaphoretic.  HENT:     Head: Normocephalic and atraumatic.     Right Ear: External ear normal.     Left Ear: External ear normal.     Nose: Nose normal.     Mouth/Throat:     Mouth: Mucous membranes are moist.  Eyes:     Extraocular Movements: Extraocular movements intact.     Pupils: Pupils are equal, round, and reactive to light.  Neck:     Vascular: No carotid bruit.  Cardiovascular:     Rate and Rhythm: Normal rate. Rhythm irregular.     Pulses: Normal pulses.     Heart sounds: Murmur heard.  Pulmonary:     Effort: Pulmonary effort is normal.     Breath sounds: Normal breath sounds.  Abdominal:     General: Bowel sounds are normal. There is no distension.     Palpations: Abdomen is soft.     Tenderness: There is no abdominal tenderness. There is no guarding.  Musculoskeletal:     Right lower leg: 2+ Edema present.     Left lower leg: 2+ Edema present.  Skin:    General: Skin is warm.  Neurological:     General: No focal deficit present.     Mental Status: He is alert and oriented to person, place, and time.     Cranial Nerves: No cranial nerve deficit.  Psychiatric:        Mood and Affect: Mood normal.        Behavior: Behavior normal.     Labs on Admission: I have personally reviewed following labs and imaging studies  No results for input(s): CKTOTAL, CKMB, TROPONINI in the last 72 hours. Lab Results  Component Value Date   WBC 7.6 01/08/2021   HGB 11.6 (L) 01/08/2021   HCT 36.3 (L) 01/08/2021   MCV 89.9  01/08/2021   PLT 204 01/08/2021    Recent Labs  Lab 01/08/21 1550  NA 132*  K 4.0  CL 95*  CO2 27  BUN 17  CREATININE 0.81  CALCIUM 9.1  PROT 6.3*  BILITOT 0.9  ALKPHOS 60  ALT 12  AST 25  GLUCOSE 124*   Lab Results  Component Value Date   CHOL 140 12/05/2020   HDL 57 12/05/2020   LDLCALC 64 12/05/2020   TRIG 106 12/05/2020   No results found for: DDIMER Invalid input(s): POCBNP  Urinalysis    Component Value Date/Time   COLORURINE YELLOW (A) 09/10/2017 2157   APPEARANCEUR HAZY (A) 09/10/2017 2157   LABSPEC 1.019 09/10/2017 2157   PHURINE 5.0 09/10/2017 2157   GLUCOSEU NEGATIVE 09/10/2017 2157   HGBUR MODERATE (A) 09/10/2017 2157   BILIRUBINUR NEGATIVE 09/10/2017 2157   KETONESUR 5 (A) 09/10/2017 2157   PROTEINUR 100 (A) 09/10/2017 2157   NITRITE NEGATIVE 09/10/2017 2157   LEUKOCYTESUR NEGATIVE 09/10/2017 2157   COVID-19 Labs No results for input(s): DDIMER, FERRITIN, LDH, CRP in the last 72 hours. Lab Results  Component Value Date   SARSCOV2NAA NEGATIVE 01/08/2021    Radiological Exams on Admission:ASi DG Chest Portable 1 View  Result Date: 01/08/2021 CLINICAL DATA:  Shortness of breath. EXAM: PORTABLE CHEST 1 VIEW COMPARISON:  September 15, 2017. FINDINGS: Stable cardiomegaly. No pneumothorax is noted. Mild bibasilar atelectasis or infiltrates are noted. No significant pleural effusion is noted. Bony thorax is unremarkable. IMPRESSION: Mild bibasilar subsegmental atelectasis or infiltrates. Aortic Atherosclerosis (ICD10-I70.0). Electronically Signed   By: Lupita Raider M.D.   On: 01/08/2021 16:19    EKG: Independently reviewed.  A.fib rvr at 107.   Assessment/Plan Principal Problem:   Shortness of breath Active Problems:   CHF (congestive heart failure), NYHA class II, chronic, diastolic (HCC)   Atrial fibrillation (HCC)   HLD (hyperlipidemia)   Hypothyroidism   Anemia   Shortness of breath: Differentials include congestive heart failure  exacerbation, PE, GERD variant asthma. Will admit patient to progressive cardiac unit with supplemental oxygen and supportive care with continuous cardiac monitoring.  Congestive heart failure: Patient has a history of chronic diastolic heart failure.  Echocardiogram from 2019 reviewed shows systolic function is reduced with a EF of 40 to 45%.  Diuretic therapy as needed with strict I's and O's and daily I's and O's and weights.  Patient currently received Lasix 60 mg IV x1 in the emergency room.  Patient also found to have a elevated positive troponin which we will trend.  Chest pain-free currently and will monitor for any symptom development.  Atrial fibrillation: With A. fib RVR on EKG, will continue home regimen with metoprolol. Monitor for electrolytes and replace as needed.   Hyperlipidemia: Home regimen of statin therapy continued. A.m. lipid panel.  Hypothyroidism: We will check patient's free T4 and TSH. Continue home dose of levothyroxine at 25 mcg.  Anemia: Patient's last anemia panel showed a serum iron of 57, TIBC of 259, percent saturation of 22 and ferritin of 78 all which means normal parameters suspect anemia and anemia of chronic disease.  We will type and screen, IV PPI therapy and obtain a stool guaiac.  DVT prophylaxis:  Heparin    Code Status:  Full code   Family Communication:  Burnside,Garrison B (Son)  [phone] [facility] Phone)   Disposition Plan:  Home    Consults called:  None  Admission status: Inpatient.     Gertha Calkin MD [facility] [phone] How to contact the [facility] Attending or Consulting provider 7A - 7P or covering  provider during after hours 7P -7A, for this patient.    Check the care team in [facility] and look for a) attending/consulting [facility] provider listed and b) the [facility] team listed Log into [email or web address] and use [place]'s universal password to access. If you do not have the password, please contact the hospital operator. Locate  the [facility] provider you are looking for under [facility] and page to a number that you can be directly reached. If you still have difficulty reaching the provider, please page the [facility] (Director on Call) for the Hospitalists listed on amion for assistance. [email or web address] Password [email or web address] 01/08/2021, 5:09 PM

## 2021-01-08 NOTE — ED Provider Notes (Signed)
[facility]    CSN: [id number] Arrival date & time: 01/08/21  1401      History   Chief Complaint No chief complaint on file.   HPI Kirk Sanchez is a 87 y.o. male.   HPI  87 year old male here for evaluation of bilateral leg swelling.  Patient is here with his daughter-in-law who reports that he fell 2 weeks ago and injured his right ankle.  Since then he has had swelling to the right leg and then 5 days ago patient called and said that he was not feeling well and his left leg was swollen.  Patient reports that he has been staying in bed a lot and not feeling him to his normal self.  Patient describes having central chest pressure and shortness of breath at present.  Past Medical History:  Diagnosis Date   Atrial fibrillation (HCC)    Hyperlipidemia    Hypertension    Kidney stone    Presbycusis of both ears 03/06/2016   Thyroid disease     Patient Active Problem List   Diagnosis Date Noted   CHF (congestive heart failure), NYHA class II, chronic, diastolic (HCC) 08/09/2019   Ischemic cardiomyopathy 08/09/2019   Senile purpura (HCC) 02/06/2019   DNR (do not resuscitate)    Palliative care by specialist    Congestive heart failure (HCC)    Hyponatremia 09/10/2017   Right shoulder tendonitis 03/08/2017   Atrial fibrillation (HCC) 03/06/2016   Presbycusis of both ears 03/06/2016   Anemia 03/03/2016   Arteriosclerosis of coronary artery 01/28/2015   Cardiac murmur 01/28/2015   HLD (hyperlipidemia) 01/28/2015   BP (high blood pressure) 01/28/2015   Hypothyroidism 01/28/2015    Past Surgical History:  Procedure Laterality Date   HAND ARTHROPLASTY     HIP ARTHROPLASTY     STENT PLACEMENT RT URETER ([facility] HX)         Home Medications    Prior to Admission medications   Medication Sig Start Date End Date Taking? Authorizing Provider  aspirin 81 MG tablet Take 81 mg by mouth daily.   Yes [provider]  Calcium Carb-Cholecalciferol  (CALCIUM-VITAMIN D) 500-200 MG-UNIT tablet Take 1 tablet by mouth 2 (two) times daily.    Yes [provider]  levothyroxine (SYNTHROID) 25 MCG tablet TAKE 1 TABLET BY MOUTH ONCE DAILY ON AN EMPTY STOMACH. WAIT 30 MINUTES BEFORE TAKING OTHER MEDS. 01/18/20  Yes Danelle Berry, PA-C  metoprolol succinate (TOPROL-XL) 50 MG 24 hr tablet Take 1 tablet (50 mg total) by mouth daily. Take with or immediately following a meal. 04/16/20  Yes Jamelle Haring, MD  simvastatin (ZOCOR) 40 MG tablet TAKE 1 TABLET BY MOUTH AT BEDTIME 04/15/20  Yes Jamelle Haring, MD  Elastic Bandages & Supports (MEDICAL COMPRESSION STOCKINGS) MISC Put on both legs in the morning, take off at night; do NOT sleep in them 11/04/17   Lada, Janit Bern, MD    Family History Family History  Problem Relation Age of Onset   Cancer Mother        stomach   Asthma Father    Stroke Neg Hx    Heart disease Neg Hx     Social History Social History   Tobacco Use   Smoking status: Never   Smokeless tobacco: Never  Vaping Use   Vaping Use: Never used  Substance Use Topics   Alcohol use: No    Alcohol/week: 0.0 standard drinks   Drug use: No  Allergies   Patient has no known allergies.   Review of Systems Review of Systems  Respiratory:  Positive for shortness of breath.   Cardiovascular:  Positive for chest pain, palpitations and leg swelling.    Physical Exam Triage Vital Signs ED Triage Vitals  Enc Vitals Group     BP 01/08/21 1427 (!) 121/93     Pulse Rate 01/08/21 1427 (!) 120     Resp 01/08/21 1427 18     Temp 01/08/21 1427 98 F (36.7 C)     Temp Source 01/08/21 1427 Oral     SpO2 01/08/21 1427 98 %     Weight 01/08/21 1429 179 lb 14.3 oz (81.6 kg)     Height 01/08/21 1429 5\' 7"  (1.702 m)     Head Circumference --      Peak Flow --      Pain Score 01/08/21 1428 5     Pain Loc --      Pain Edu? --      Excl. in GC? --    No data found.  Updated Vital Signs BP (!) 121/93 (BP  Location: Right Arm)   Pulse (!) 120 Comment: heart rate between 58-130  Temp 98 F (36.7 C) (Oral)   Resp 18   Ht 5\' 7"  (1.702 m)   Wt 179 lb 14.3 oz (81.6 kg)   SpO2 98%   BMI 28.18 kg/m   Visual Acuity Right Eye Distance:   Left Eye Distance:   Bilateral Distance:    Right Eye Near:   Left Eye Near:    Bilateral Near:     Physical Exam Vitals and nursing note reviewed.  Constitutional:      General: He is not in acute distress.    Appearance: Normal appearance. He is not ill-appearing.  Cardiovascular:     Rate and Rhythm: Tachycardia present. Rhythm irregular.     Pulses: Normal pulses.     Heart sounds: Normal heart sounds. No murmur heard.   No gallop.  Pulmonary:     Effort: Pulmonary effort is normal.     Breath sounds: Normal breath sounds. No wheezing, rhonchi or rales.  Musculoskeletal:        General: No tenderness.     Right lower leg: Edema present.     Left lower leg: Edema present.  Skin:    General: Skin is warm and dry.     Capillary Refill: Capillary refill takes less than 2 seconds.     Findings: No erythema.  Neurological:     General: No focal deficit present.     Mental Status: He is alert and oriented to person, place, and time.  Psychiatric:        Mood and Affect: Mood normal.        Behavior: Behavior normal.        Thought Content: Thought content normal.        Judgment: Judgment normal.     UC Treatments / Results  Labs (all labs ordered are listed, but only abnormal results are displayed) Labs Reviewed - No data to display  EKG   Radiology No results found.  Procedures Procedures (including critical care time)  Medications Ordered in UC Medications  aspirin chewable tablet 243 mg (243 mg Oral Given 01/08/21 1453)    Initial Impression / Assessment and Plan / UC Course  I have reviewed the triage vital signs and the nursing notes.  Pertinent labs & imaging results that were available  during my care of the patient  were reviewed by me and considered in my medical decision making (see chart for details).  Patient is a very pleasant 87 year old male who is hard of hearing who was brought in by his daughter-in-law for evaluation of bilateral leg swelling that started after a fall 2 weeks ago as outlined in the HPI above.  Patient's heart rate was found to be irregular during triage so an EKG was obtained which shows atrial fibrillation with a rapid ventricular response.  Patient's pulse rate is ranging from 45-1 20 on the monitor and EKG captured 107 bpm.  Patient has a history of A. fib but is not currently taking any medication for it.  He last saw his cardiologist 4 months ago and is due to see him in July.  Patient states that he having a funny feeling that he finally describes as pressure in the center of his chest and also feeling short winded.  Cardiopulmonary exam reveals an irregularly irregular tachycardic rhythm without murmur, rub, or gallop.  Patient has 2+ peripheral pulses.  Patient has 2+ pitting edema to above the level of the knee bilaterally.  Patient has no tenderness in his calf and no Homans' sign in either leg.  Patient takes a baby aspirin at night so we will give 243 mg of baby aspirin here, started peripheral IV, and send him to the hospital by EMS for evaluation of his A. fib with RVR.  Report called to Vincenza Hews the charge nurse in the ER at [facility].  Report given to [facility] emergency medical services and care transferred.   Final Clinical Impressions(s) / UC Diagnoses   Final diagnoses:  Atrial fibrillation with RVR (HCC)  Peripheral edema     Discharge Instructions      Please go to the emergency department at [facility] for evaluation of your chest pressure, atrial fibrillation, and swollen legs.     ED Prescriptions   None    PDMP not reviewed this encounter.   Becky Augusta, NP 01/08/21 1459

## 2021-01-08 NOTE — Telephone Encounter (Signed)
Reason for Disposition  [1] After 2 weeks AND [2] still painful or swollen  Answer Assessment - Initial Assessment Questions 1. MECHANISM: "How did the injury happen?" (e.g., twisting injury, direct blow)    He fell a week or longer ago and twisted his ankle.   It's very swollen now.  His right ankle.   It's swollen to his knee.  He has not been feeling good since he fell.   He called daughter last Sat.  He has been in the bed most of the week and now the other leg is swollen to mid calf. 2. ONSET: "When did the injury happen?" (Minutes or hours ago)      Over a week ago almost 2 weeks ago 3. LOCATION: "Where is the injury located?"      Both legs are now swollen   Right ankle is swollen.   Right knee swollen 4. APPEARANCE of INJURY: "What does the injury look like?"      Twisted right ankle.    He has a spot on his leg where he dropped something hot on it.   It's the size of a quarter.    5. SEVERITY: "Can you put weight on that leg?" "Can you walk?"      He can walk slowly.    He is usually very active.  6. SIZE: For cuts, bruises, or swelling, ask: "How large is it?" (e.g., inches or centimeters;  entire joint)      No cuts. His right ankle after being twisted caused him to walk slowly.   He is using his cane.     He refused to be seen by a doctor since he fell.     Daughter came to his house today after his neighbor called her and said he was still in bed and both of his legs swollen.    He refuses to go to the doctor.     He is just like that.     I'm married to his son.   My husband has ALS.   He has 2 other sons that should be helping in this situation.   7. PAIN: "Is there pain?" If Yes, ask: "How bad is the pain?"  "What does it keep you from doing?" (e.g., Scale 1-10; or mild, moderate, severe)   -  NONE: (0): no pain   -  MILD (1-3): doesn't interfere with normal activities    -  MODERATE (4-7): interferes with normal activities (e.g., work or school) or awakens from sleep, limping     -  SEVERE (8-10): excruciating pain, unable to do any normal activities, unable to walk     Moderate 8. TETANUS: For any breaks in the skin, ask: "When was the last tetanus booster?"     Not asked 9. OTHER SYMPTOMS: "Do you have any other symptoms?"  (e.g., "pop" when knee injured, swelling, locking, buckling)      Right ankle is swollen from being twisted.   Both legs are now swollen. 10. PREGNANCY: "Is there any chance you are pregnant?" "When was your last menstrual period?"       N/A  Protocols used: Knee Injury-A-AH

## 2021-01-08 NOTE — ED Triage Notes (Addendum)
Daughter in law states he fell 2 weeks ago and hurt his right ankle. She reports right leg swelling that started after he fell. She states Saturday the patient called her with not feeling well and his left lower leg is now swollen. She reports the patient has been staying in bed a lot and this is not normal for him.  He also reports "feeling funny" in his chest and he is having some SOB.

## 2021-01-08 NOTE — ED Notes (Signed)
Patient is being discharged from the Urgent Care and sent to the Emergency Department via EMS. Per Berneta Sages, NP, patient is in need of higher level of care due to A-fib with RVR. Patient is aware and verbalizes understanding of plan of care.  Vitals:   01/08/21 1427  BP: (!) 121/93  Pulse: (!) 120  Resp: 18  Temp: 98 F (36.7 C)  SpO2: 98%

## 2021-01-08 NOTE — Telephone Encounter (Signed)
Tried to call both #'s listed in chart and son's, pt does need to be evaluated at Urgent care.

## 2021-01-08 NOTE — ED Provider Notes (Signed)
[facility] Emergency Department Provider Note  ____________________________________________   Event Date/Time   First MD Initiated Contact with Patient 01/08/21 1544     (approximate)  I have reviewed the triage vital signs and the nursing notes.   HISTORY  Chief Complaint Shortness of Breath   HPI Kirk Sanchez is a 87 y.o. male with past medical history significant for CHF, paroxysmal A. fib, hyperlipidemia and hypertension who reports to the emergency department via EMS from [facility] urgent care.  He initially presented to urgent care complaining of bilateral lower extremity swelling for 2 weeks and feeling that his chest "felt funny". He denies any specific chest pain, lightheadedness, presyncopal sensation, recent falls.  He denies any recent fevers, cough or any other significant symptoms.  He is unable to tell me what medications he takes on a regular basis or if he took these recently.  He does state that he lives alone, drives himself when he needs to, uses a cane as needed for prolonged walking; otherwise is usually independent.       Past Medical History:  Diagnosis Date   Atrial fibrillation (HCC)    Hyperlipidemia    Hypertension    Kidney stone    Presbycusis of both ears 03/06/2016   Thyroid disease     Patient Active Problem List   Diagnosis Date Noted   Shortness of breath 01/08/2021   CHF (congestive heart failure), NYHA class II, chronic, diastolic (HCC) 08/09/2019   Ischemic cardiomyopathy 08/09/2019   Senile purpura (HCC) 02/06/2019   DNR (do not resuscitate)    Palliative care by specialist    Hyponatremia 09/10/2017   Right shoulder tendonitis 03/08/2017   Atrial fibrillation (HCC) 03/06/2016   Presbycusis of both ears 03/06/2016   Anemia 03/03/2016   Arteriosclerosis of coronary artery 01/28/2015   Cardiac murmur 01/28/2015   HLD (hyperlipidemia) 01/28/2015   BP (high blood pressure) 01/28/2015   Hypothyroidism  01/28/2015    Past Surgical History:  Procedure Laterality Date   HAND ARTHROPLASTY     HIP ARTHROPLASTY     STENT PLACEMENT RT URETER ([facility] HX)      Prior to Admission medications   Medication Sig Start Date End Date Taking? Authorizing Provider  aspirin 81 MG tablet Take 81 mg by mouth daily.    [provider]  Calcium Carb-Cholecalciferol (CALCIUM-VITAMIN D) 500-200 MG-UNIT tablet Take 1 tablet by mouth 2 (two) times daily.     [provider]  Elastic Bandages & Supports (MEDICAL COMPRESSION STOCKINGS) MISC Put on both legs in the morning, take off at night; do NOT sleep in them 11/04/17   Lada, Janit Bern, MD  levothyroxine (SYNTHROID) 25 MCG tablet TAKE 1 TABLET BY MOUTH ONCE DAILY ON AN EMPTY STOMACH. WAIT 30 MINUTES BEFORE TAKING OTHER MEDS. 01/18/20   Danelle Berry, PA-C  metoprolol succinate (TOPROL-XL) 50 MG 24 hr tablet Take 1 tablet (50 mg total) by mouth daily. Take with or immediately following a meal. 04/16/20   Jamelle Haring, MD  simvastatin (ZOCOR) 40 MG tablet TAKE 1 TABLET BY MOUTH AT BEDTIME 04/15/20   Jamelle Haring, MD    Allergies Patient has no known allergies.  Family History  Problem Relation Age of Onset   Cancer Mother        stomach   Asthma Father    Stroke Neg Hx    Heart disease Neg Hx     Social History Social History   Tobacco Use  Smoking status: Never   Smokeless tobacco: Never  Vaping Use   Vaping Use: Never used  Substance Use Topics   Alcohol use: No    Alcohol/week: 0.0 standard drinks   Drug use: No    Review of Systems Constitutional: No fever/chills Eyes: No visual changes. ENT: No sore throat. Cardiovascular: +" Funny feeling" in chest, denies chest pain. Respiratory: Denies shortness of breath. Gastrointestinal: No abdominal pain.  No nausea, no vomiting.  No diarrhea.  No constipation. Genitourinary: Negative for dysuria. Musculoskeletal: + Bilateral lower extremity edema, negative for  back pain. Skin: Negative for rash. Neurological: Negative for headaches, focal weakness or numbness.  ____________________________________________   PHYSICAL EXAM:  VITAL SIGNS: ED Triage Vitals  Enc Vitals Group     BP 01/08/21 1551 (!) 138/100     Pulse Rate 01/08/21 1551 (!) 128     Resp 01/08/21 1551 18     Temp 01/08/21 1615 97.9 F (36.6 C)     Temp Source 01/08/21 1615 Oral     SpO2 01/08/21 1551 (!) 88 %     Weight 01/08/21 1542 178 lb 9.2 oz (81 kg)     Height 01/08/21 1542 5\' 7"  (1.702 m)     Head Circumference --      Peak Flow --      Pain Score 01/08/21 1542 0     Pain Loc --      Pain Edu? --      Excl. in GC? --    Constitutional: Alert and oriented. Well appearing and in no acute distress. Eyes: Conjunctivae are normal. PERRL. EOMI. Head: Atraumatic. Nose: No congestion/rhinnorhea. Mouth/Throat: Mucous membranes are moist.  Neck: No stridor.   Cardiovascular: Tachycardic, irregular rhythm. Grossly normal heart sounds.  2+ bilateral lower extremity pitting edema Respiratory: Normal respiratory effort.  No retractions. Lungs CTAB. Gastrointestinal: Soft and nontender. No distention. No abdominal bruits. No CVA tenderness. Musculoskeletal: No lower extremity tenderness, no erythema.  No joint effusions. Neurologic:  Normal speech and language. No gross focal neurologic deficits are appreciated.  Skin:  Skin is warm, dry and intact. No rash noted. Psychiatric: Mood and affect are normal. Speech and behavior are normal.  ____________________________________________   LABS (all labs ordered are listed, but only abnormal results are displayed)  Labs Reviewed  CBC WITH DIFFERENTIAL/PLATELET - Abnormal; Notable for the following components:      Result Value   RBC 4.04 (*)    Hemoglobin 11.6 (*)    HCT 36.3 (*)    RDW 15.8 (*)    All other components within normal limits  COMPREHENSIVE METABOLIC PANEL - Abnormal; Notable for the following components:    Sodium 132 (*)    Chloride 95 (*)    Glucose, Bld 124 (*)    Total Protein 6.3 (*)    All other components within normal limits  BRAIN NATRIURETIC PEPTIDE - Abnormal; Notable for the following components:   B Natriuretic Peptide 220.0 (*)    All other components within normal limits  TROPONIN I (HIGH SENSITIVITY) - Abnormal; Notable for the following components:   Troponin I (High Sensitivity) 69 (*)    All other components within normal limits  RESP PANEL BY RT-PCR (FLU A&B, COVID) ARPGX2  MAGNESIUM  TROPONIN I (HIGH SENSITIVITY)   ____________________________________________  EKG  Afib with RVR with a rate of 114. Left axis deviation. No evidence of ST elevation or depression. ____________________________________________  RADIOLOGY 03/10/21, personally viewed and evaluated these images (  plain radiographs) as part of my medical decision making, as well as reviewing the written report by the radiologist.  ED provider interpretation:  Evidence of pulmonary vascular congestion. No identified infiltrates, no blunting of costovertebral angles consistent with effusion  Official radiology report(s): DG Chest Portable 1 View  Result Date: 01/08/2021 CLINICAL DATA:  Shortness of breath. EXAM: PORTABLE CHEST 1 VIEW COMPARISON:  September 15, 2017. FINDINGS: Stable cardiomegaly. No pneumothorax is noted. Mild bibasilar atelectasis or infiltrates are noted. No significant pleural effusion is noted. Bony thorax is unremarkable. IMPRESSION: Mild bibasilar subsegmental atelectasis or infiltrates. Aortic Atherosclerosis (ICD10-I70.0). Electronically Signed   By: Lupita Raider M.D.   On: 01/08/2021 16:19      ____________________________________________   INITIAL IMPRESSION / ASSESSMENT AND PLAN / ED COURSE  As part of my medical decision making, I reviewed the following data within the electronic MEDICAL RECORD NUMBER History obtained from family, Nursing notes reviewed and  incorporated, Labs reviewed, EKG interpreted, Radiograph reviewed, Discussed with admitting physician, Evaluated by EM attending, and Notes from prior ED visits        Patient reports to the ER via EMS for evaluation of bilateral LE edema and feeling "funny" in his chest. He denies any chest pain, shortness of breath, dizziness, syncope, fevers, cough or other significant symptoms.  In triage, patient is noted to be tachycardic with a rate of 128, normotensive at 138/100, mildly hypoxic with 88%.  He was placed on 2 L by nasal cannula by nursing and improved to 97 to 100% the remainder of his time in the emergency department.  He intermittently would come out of RVR with a rate around 90, but has remained in A. fib during his course in the emergency department.  On physical exam he has bilateral pitting edema of about 2+ bilaterally, his heart rate tachycardic during exam, but otherwise no other significant auscultation identified.   Laboratory evaluation included CMP, CBC, BMP, troponin, magnesium, respiratory panel.  Patient's CMP is significant for a mild hyponatremia of 132, mild hypochloremia at 95, glucose of 124.  CBC shows a very mild microcytic anemia of 11.6.  BNP elevated at 220, troponin elevated at 69.  Mag 1.9.  Chest x-ray was reviewed and shows some pulmonary vascular congestion with no evidence of acute infiltrate, and he has not had any fever, cough or other acute illness symptoms.  Will initiate IV Lasix 60 mg, consult to the hospitalist.  Discussed that patient with Dr. Allena Katz, hospitalist for admission.  She accepts the patient for admission.  Patient stable this time for transfer to the floor.  Patient was also seen and examined by my attending physician Dr. Delton Prairie who agrees with patient's work-up and plan of care.      ____________________________________________   FINAL CLINICAL IMPRESSION(S) / ED DIAGNOSES  Final diagnoses:  Acute on chronic heart failure,  unspecified heart failure type [facility])  Atrial fibrillation, unspecified type [facility])     ED Discharge Orders     None        Note:  This document was prepared using Dragon voice recognition software and may include unintentional dictation errors.    Lucy Chris, PA 01/08/21 Zollie Pee    Delton Prairie, MD 01/08/21 [phone]

## 2021-01-08 NOTE — Telephone Encounter (Signed)
[name], daughter in law called in.   Pt questionably fell 2 weeks ago and twisted his right ankle.  He refused to see a doctor.   "He is just that way".  He has been limping on it and using his cane.   His right leg is swollen up to his knee.   He has become very lethargic and laying in the bed all the time.   His left leg is now swollen to his mid calf and he is laying in the bed and sleeping a lot.   His neighbor is the one that called me.   I'm married to one of the pt's sons who has ALS.   He has 2 other sons that should be helping out in this situation who are not.   I'm trying to care for my husband and his father so I have my hands full.      The neighbor mentioned the pt has been very tired and sleeping a lot.   He is usually up and outside.   This is very unlike him to be laying in the bed.   Now that his other leg is swollen I feel there is something more going on with him.    There is not a knee injury involved, it's his right ankle.   He also has a spot on his leg about the size of a quarter where he said he "Dropped something hot on it".    "I don't know when that happened".     I made him an appt with Gabriel Cirri, NP at [facility].   The provider he used to see has left the practice.  It's on 01/22/2021 at 3:00.   [name] is going to take him to an urgent care today, probably the [facility] in [place].   I let her know he needed to be seen and going on to the urgent care is advisable.   We will keep the appt for 6/23.  [name] thanked me for my help with this situation.

## 2021-01-08 NOTE — Discharge Instructions (Addendum)
Please go to the emergency department at [facility] for evaluation of your chest pressure, atrial fibrillation, and swollen legs.

## 2021-01-08 NOTE — ED Triage Notes (Signed)
Patient BIBA from Urgent Care. Patient reports increasing shortness of breath for past couple days. Patient reports chest pressure. Hx of afib. Patient HR 90-120s per EMS in route to hospital. Patient reports bilateral lower extremity swelling for 2 weeks. Patient A&OX3 and lives at home. Per family patient is typically very active but has been laying in bed more for past few days.

## 2021-01-08 NOTE — ED Notes (Signed)
Diet ordered, tray requested from dietary 

## 2021-01-09 ENCOUNTER — Inpatient Hospital Stay: Payer: Medicare Other

## 2021-01-09 ENCOUNTER — Inpatient Hospital Stay: Admit: 2021-01-09 | Payer: Medicare Other

## 2021-01-09 DIAGNOSIS — R0602 Shortness of breath: Secondary | ICD-10-CM | POA: Diagnosis not present

## 2021-01-09 DIAGNOSIS — I5023 Acute on chronic systolic (congestive) heart failure: Secondary | ICD-10-CM | POA: Diagnosis present

## 2021-01-09 DIAGNOSIS — H9193 Unspecified hearing loss, bilateral: Secondary | ICD-10-CM

## 2021-01-09 DIAGNOSIS — Z7189 Other specified counseling: Secondary | ICD-10-CM

## 2021-01-09 DIAGNOSIS — R531 Weakness: Secondary | ICD-10-CM

## 2021-01-09 DIAGNOSIS — E039 Hypothyroidism, unspecified: Secondary | ICD-10-CM

## 2021-01-09 DIAGNOSIS — I4891 Unspecified atrial fibrillation: Secondary | ICD-10-CM

## 2021-01-09 LAB — CBC
HCT: 37.1 % — ABNORMAL LOW (ref 39.0–52.0)
Hemoglobin: 11.7 g/dL — ABNORMAL LOW (ref 13.0–17.0)
MCH: 28.3 pg (ref 26.0–34.0)
MCHC: 31.5 g/dL (ref 30.0–36.0)
MCV: 89.6 fL (ref 80.0–100.0)
Platelets: 218 10*3/uL (ref 150–400)
RBC: 4.14 MIL/uL — ABNORMAL LOW (ref 4.22–5.81)
RDW: 15.9 % — ABNORMAL HIGH (ref 11.5–15.5)
WBC: 9.1 10*3/uL (ref 4.0–10.5)
nRBC: 0 % (ref 0.0–0.2)

## 2021-01-09 LAB — COMPREHENSIVE METABOLIC PANEL
ALT: 14 U/L (ref 0–44)
AST: 27 U/L (ref 15–41)
Albumin: 3.4 g/dL — ABNORMAL LOW (ref 3.5–5.0)
Alkaline Phosphatase: 61 U/L (ref 38–126)
Anion gap: 10 (ref 5–15)
BUN: 16 mg/dL (ref 8–23)
CO2: 27 mmol/L (ref 22–32)
Calcium: 8.8 mg/dL — ABNORMAL LOW (ref 8.9–10.3)
Chloride: 93 mmol/L — ABNORMAL LOW (ref 98–111)
Creatinine, Ser: 0.91 mg/dL (ref 0.61–1.24)
GFR, Estimated: >60 mL/min (ref >60)
Glucose, Bld: 126 mg/dL — ABNORMAL HIGH (ref 70–99)
Potassium: 4.1 mmol/L (ref 3.5–5.1)
Sodium: 130 mmol/L — ABNORMAL LOW (ref 135–145)
Total Bilirubin: 1.2 mg/dL (ref 0.3–1.2)
Total Protein: 6.3 g/dL — ABNORMAL LOW (ref 6.5–8.1)

## 2021-01-09 LAB — PHOSPHORUS: Phosphorus: 4.4 mg/dL (ref 2.5–4.6)

## 2021-01-09 LAB — D-DIMER, QUANTITATIVE: D-Dimer, Quant: 1.96 ug/mL-FEU — ABNORMAL HIGH (ref 0.00–0.50)

## 2021-01-09 LAB — HEMOGLOBIN A1C
Hgb A1c MFr Bld: 5.8 % — ABNORMAL HIGH (ref 4.8–5.6)
Mean Plasma Glucose: 120 mg/dL

## 2021-01-09 MED ORDER — APIXABAN 5 MG PO TABS
5.0000 mg | ORAL_TABLET | Freq: Two times a day (BID) | ORAL | Status: DC
  Administered 2021-01-09 – 2021-01-16 (×14): 5 mg via ORAL
  Filled 2021-01-09 (×15): qty 1

## 2021-01-09 MED ORDER — METOPROLOL SUCCINATE ER 25 MG PO TB24
25.0000 mg | ORAL_TABLET | Freq: Every day | ORAL | Status: DC
  Administered 2021-01-09 – 2021-01-15 (×7): 25 mg via ORAL
  Filled 2021-01-09 (×7): qty 1

## 2021-01-09 MED ORDER — HALOPERIDOL LACTATE 5 MG/ML IJ SOLN
1.0000 mg | Freq: Four times a day (QID) | INTRAMUSCULAR | Status: DC | PRN
  Filled 2021-01-09: qty 1

## 2021-01-09 MED ORDER — FUROSEMIDE 10 MG/ML IJ SOLN
20.0000 mg | Freq: Once | INTRAMUSCULAR | Status: AC
  Administered 2021-01-09: 20 mg via INTRAVENOUS
  Filled 2021-01-09: qty 4

## 2021-01-09 NOTE — Consult Note (Addendum)
   Heart Failure Nurse Navigator Note  HFrEF 40-45% by echocardiogram in 2019.  Mild aortic.  Mild to moderate mitral regurgitation.  Mild left atrial enlargement.  Moderate right atrial mild LVH.  Echocardiogram is pending on this admission.  He presented to the ED from Urgent Care due to a fall and lower extremity edema.  Co morbidities:  Paroxysmal atrial fibrillation Hypertension Hyperlipidemia Coronary artery disease with bare-metal stent to the LAD in 1996.  Is very hard of hearing.  Medications:  Metoprolol succinate 25 mg daily Aspirin 81 g daily Zocor 40 mg daily  Labs:  Sodium 138, potassium 4.1, chloride 93, CO2 27, BUN 16, creatinine 0.91, hemoglobin 11.7, BNP 220, troponin level 82.69, total cholesterol 188, triglycerides 80, HDL LDL 105.  Assessment:  General-he is awake and alert lying in bed.   HEENT-wears hearing aid in the left ear but is still very hard of hearing.  Cardiac-heart tones of regular rate and rhythm.  Chest-breath sounds clear to posterior auscultation.  Abdomen-soft nontender.  Musculoskeletal-1+ pitting edema > on the left.  Psych-is pleasant and appropriate makes good eye contact.  Neurologic-speech is clear, moves all extremities without difficulty.  Initial meeting with patient.  Due to him being very hard of hearing use the written material in living with heart failure teaching booklet.  He states that he would season his food with salt and pepper his food before eating tasting it.  He states that he is now cut back.  Suggested completely removing the saltshaker from the table.  He states that he lives alone and he does his own cooking and does have friends that bring in meals.  Also discussed weighing daily and recording.  He states that he is very active at home and mows his own lawn.  He states there would be occasions when he would notice a slight chest pressure and takes Alka-Seltzer for that.  He was given the living  with heart failure teaching booklet along with zone  magnet, information on low-sodium foods and taking command of your heart failure info.    Tresa Endo RN [facility]

## 2021-01-09 NOTE — Evaluation (Signed)
Physical Therapy Evaluation Patient Details Name: Kirk Sanchez MRN: [id number] DOB: 1924/11/25 Today's Date: 01/09/2021   History of Present Illness  Kirk Sanchez is a 87 y.o. male seen in ed with complaints of  fall 2 weeks ago and hurt his rt ankle and has been progressively dwelling and now left ankle is swollen as well. Pt also reports having midsternal chest discomfort. Pt came via ems from urgent care. EKG shows a.fib rvr and pt has h/o PAF, pt is not on any NOAC.Edema is symmetric 2 + both edema. Pt has past medical history of PAF, Hyperlipidemia, HTN, Kidney stone, CHF, Hypothyroidism hearing impaired. Admitted for shortness of breath (differentials heart failure exacerbation, PE, GERD variant asthma). Positive troponin which will be trended. lives at home. Per family patient is typically very active but has been laying in bed more for past few days.   Clinical Impression  Patient reclining in bed asleep, SpO2 86% on room air. Awakes and is agreeable to PT. Is very HOH and cannot hear PT so communication is limited. History obtained largely from DIL Kirk Sanchez after attempting to contact pt's emergency contact, son Kirk Sanchez. Kirk Sanchez (who per Kirk Sanchez is unwell with ALS and unable to contribute to patient's care). Patient lives alone in a tri-level home with 2 steps to enter to the den, 13 steps to the 2nd floor (kitchen), and 4 additional steps to the bedroom. Prior to hospitalization pt was I with all aspects of care including driving 1x a week despite family asking him not to and offering to help. Kirk Sanchez gets groceries once a week for him. 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 Patient denies any falls and none have been witnessed to [employer] knowledge. He has been using a SPC in the last few weeks after an ankle injury. Upon PT eval, patient requires min A for supine to sit but able to return to bed without physical assist. Completes sit <> stand with CGA to SBA but did lose balance backwards  resulting in failed attempt to get up and a poor eccentric control when sitting at some occassions. Once standing did reach for objects to steady himself so utilized RW during ambulation where he traveled ~ 160 feet with L sided antalgic gait and some drifting to the right hitting a couple of objects with the RW in the hall (able to get walker unstuck and continue without cuing). Patient able to complete 5 Times Sit To Stand with BUE from chair in 23 seconds but did have one failed attempt due to not shifting forward and poor hand placement and got distracted mid test. Patient's SpO2 did appear to drop to ~ 85% during ambulation and returned to 90s with rest. Patient appears to have experienced some decline in functional independence/strength and is at risk for falling. Concern for being discharged to tri-level home alone without supervision and recommend 24/7 supervision with RW and 3in1 commode. If unable to be supervised at home, recommend short term rehab to regain sufficient balance and functional independence to return home without risk of harm. Patient would benefit from skilled physical therapy to address impairments and functional limitations (see PT Problem List below) to work towards stated goals and return to PLOF or maximal functional independence.      Follow Up Recommendations Home health PT;Supervision/Assistance - 24 hour (SNF if supervision unable to be provided)    Equipment Recommendations  3in1 (PT);Rolling walker with 5" wheels    Recommendations for Other Services  Precautions / Restrictions Precautions Precautions: Fall Restrictions Weight Bearing Restrictions: No      Mobility  Bed Mobility Overal bed mobility: Needs Assistance Bed Mobility: Sit to Supine;Supine to Sit     Supine to sit: Min assist;HOB elevated Sit to supine: Supervision;HOB elevated   General bed mobility comments: Patient required hand held assist to go supine to sit but was able to perform  sit to supine with supervision and no assistance. HOB slightly elevated.    Transfers Overall transfer level: Needs assistance Equipment used: Rolling walker (2 wheeled) Transfers: Sit to/from Stand Sit to Stand: Min guard;Supervision         General transfer comment: Patient completed sit <> stand from/to bed and from/to chair to RW with min guard to supervision. Did not always place hands correctly resulting in poor eccentric control on standing and multiple attempts to stand. Completed 5 Time Sit to Stand test in 32 seconds from chair with B UE support (one failed attempt and one instance of distraction that slowwed him down. Unclear if pt understood he was to complete 5 reps as fast as possible)  Ambulation/Gait Ambulation/Gait assistance: Min guard Gait Distance (Feet): 160 Feet Assistive device: Rolling walker (2 wheeled) Gait Pattern/deviations: Antalgic;Drifts right/left Gait velocity: decreased   General Gait Details: Patient ambulated ~ 160 feet with RW and CGA. Demonstrated mildly stooped posture with antalgic gait favoring L LE. Did drift slightly towards the right and hit some objects with the RW but was able to move the walker back and continue walking. Needed guidance to find room. SoO2 appeared to drop to 84% during ambulation but patient was asymptomatic and it returned to 90s after resting.  Stairs            Wheelchair Mobility    Modified Rankin (Stroke Patients Only)       Balance Overall balance assessment: Needs assistance Sitting-balance support: Feet supported Sitting balance-Leahy Scale: Good Sitting balance - Comments: steady sitting edge of bed     Standing balance-Leahy Scale: Fair Standing balance comment: Able to let go of RW, shift weight, and reach for clothing. Does reach for objects to steady himself when RW is not there while standing.                             Pertinent Vitals/Pain Pain Assessment: Faces Faces Pain  Scale: No hurt    Home Living Family/patient expects to be discharged to:: Private residence Living Arrangements: Alone Available Help at Discharge: Family;Available PRN/intermittently (Has three sons that live in the vicinity (furthest away is in [place]). Oldest son has ALS so his wife Kirk Sanchez helps patient as she can but is unable to be there 24/7) Type of Home: House Home Access: Stairs to enter Entrance Stairs-Rails: Right Entrance Stairs-Number of Steps: 2 Home Layout: Multi-level (3 levels, bedroom is on the upper level, kitchen/living room 2nd level. Bathroom and place to sleep could be in den (1st level) but cannot reach kitchen.) Home Equipment: Gilmer Mor - single point Additional Comments: Patient extremely hard of hearing and unable to hear PT. Called patient's emergency contact Alexis Goodell to get history. Daughter in law Kirk Sanchez answered and explained her husband has ALS and is unable to communicate well. She provided history below. Was upset and hung up when PT was unable to answer her questions about patient due to HIPAA laws.    Prior Function Level of Independence: Independent with assistive device(s)  Comments: Kirk PikesSusan reports patient is fully independent with all aspects of care and function without AD except has been using SPC last few weeks after hurting his ankle, drives once a week. Does not accept help despite her efforts. Mows yard, etc. Patient denies falling in the last 6 months. DIL Kirk PikesSusan states she does not know of any confirmed falls. Kirk PikesSusan states he is out working on his tractors a lot.     Hand Dominance   Dominant Hand: Right    Extremity/Trunk Assessment   Upper Extremity Assessment Upper Extremity Assessment: Generalized weakness    Lower Extremity Assessment Lower Extremity Assessment: Generalized weakness    Cervical / Trunk Assessment Cervical / Trunk Assessment: Normal  Communication   Communication: HOH (Unable to hear PT)  Cognition  Arousal/Alertness: Awake/alert Behavior During Therapy: WFL for tasks assessed/performed Overall Cognitive Status: Within Functional Limits for tasks assessed                                 General Comments: HOH and cannot hear PT but voluntarily explains his situation and appears A&O x 4      General Comments General comments (skin integrity, edema, etc.): Completed 5 Time Sit to Stand test in 32 seconds from chair with B UE support. one failed attempt with weight too far back and one instance of distraction that slowed him down. Unclear if pt understood he was to complete 5 reps as fast as possible.    Exercises Other Exercises Other Exercises: Assisted patient with adjusting his clothing and reattaching telemetry electrodes. Attempted to educated patient on role of PT at hospital setting and cuing for improved safety during mobility. Patient able to follow some general hand signals and body language but unable to hear PT.   Assessment/Plan    PT Assessment Patient needs continued PT services  PT Problem List Decreased strength;Decreased knowledge of use of DME;Decreased activity tolerance;Decreased balance;Decreased mobility;Cardiopulmonary status limiting activity       PT Treatment Interventions DME instruction;Balance training;Gait training;Neuromuscular re-education;Stair training;Functional mobility training;Patient/family education;Therapeutic activities;Therapeutic exercise    PT Goals (Current goals can be found in the Care Plan section)  Acute Rehab PT Goals Patient Stated Goal: to go home PT Goal Formulation: With patient Time For Goal Achievement: 01/23/21 Potential to Achieve Goals: Fair    Frequency Min 2X/week   Barriers to discharge Decreased caregiver support;Inaccessible home environment Patient lives alone in a tri-level home    Co-evaluation               AM-PAC PT "6 Clicks" Mobility  Outcome Measure   Help needed moving from lying  on your back to sitting on the side of a flat bed without using bedrails?: A Little Help needed moving to and from a bed to a chair (including a wheelchair)?: A Little Help needed standing up from a chair using your arms (e.g., wheelchair or bedside chair)?: A Little Help needed to walk in hospital room?: A Little Help needed climbing 3-5 steps with a railing? : A Little 6 Click Score: 15    End of Session Equipment Utilized During Treatment: Gait belt Activity Tolerance: Patient tolerated treatment well;Patient limited by fatigue Patient left: in bed;with nursing/sitter in room;with bed alarm set Nurse Communication: Mobility status PT Visit Diagnosis: Unsteadiness on feet (R26.81);Difficulty in walking, not elsewhere classified (R26.2)    Time: [id number] PT Time Calculation (min) (ACUTE ONLY): 30 min   Charges:  PT Evaluation $PT Eval Moderate Complexity: 1 Mod PT Treatments $Therapeutic Activity: 8-22 mins        Luretha Murphy. Ilsa Iha, PT, DPT 01/09/21, 12:09 PM

## 2021-01-09 NOTE — Progress Notes (Signed)
PROGRESS NOTE  Kirk Sanchez KDX:[id number] DOB: May 05, 1925   PCP: [facility]  Patient is from: Home. Lives alone  DOA: 01/08/2021 LOS: 1  Chief complaints: Leg swelling and shortness of breath  Brief Narrative / Interim history: 87 year old M with PMH of A. fib, systolic CHF, HTN, HLD, hypothyroidism, diminished hearing, and anemia presenting with bilateral lower extremity swelling/edema, midsternal chest discomfort, shortness of breath, generalized weakness, poor PO intake and admitted for A. fib with RVR and possible CHF exacerbation.  He was started on IV Lasix.  RVR was short-lived, and he was started on metoprolol XL.  His troponin was mildly elevated.  BNP 220 but no prior to compare to.  CXR with mild bibasilar opacities.  Subjective: Seen and examined earlier this morning.  No major events overnight or this morning.  Patient has significantly diminished hearing, and most of communication was by writing. He says he is in the hospital due to right lower extremity swelling.  He denies chest pain, shortness of breath or palpitation.  He denies GI or UTI symptoms.  Patient was not able to clarify his CODE STATUS.  Objective: Vitals:   01/09/21 0400 01/09/21 0430 01/09/21 0500 01/09/21 0530  BP: 97/68 110/70  111/72  Pulse: 77 76 75 77  Resp: (!) 26 (!) 27 16 (!) 24  Temp:      TempSrc:      SpO2: 93% 97% 97% 97%  Weight:      Height:        Intake/Output Summary (Last 24 hours) at 01/09/2021 1219 Last data filed at 01/08/2021 1859 Gross per 24 hour  Intake --  Output 800 ml  Net -800 ml   Filed Weights   01/08/21 1542  Weight: 81 kg    Examination:  GENERAL: No apparent distress.  Nontoxic. HEENT: MMM.  Vision grossly intact.  Hearing diminished. NECK: Supple.  No apparent JVD.  RESP: On RA.  No IWOB.  Fair aeration bilaterally. CVS:  RRR. Heart sounds normal.  ABD/GI/GU: BS+. Abd soft, NTND.  MSK/EXT:  Moves extremities. No apparent deformity.   1+ pitting edema bilaterally SKIN: no apparent skin lesion or wound NEURO: Awake, alert and oriented appropriately.  No apparent focal neuro deficit. PSYCH: Calm. Normal affect.   Procedures:  None  Microbiology summarized: COVID-19 PCR nonreactive.  Assessment & Plan: Acute on chronic systolic CHF: patient presents with dyspnea, chest discomfort and lower extremity edema.  TTE in 2019 with LVEF of 40 to 45%.  BNP 220 but no prior to compare to.  Chest x-ray with bibasilar opacities.  He has 1+ pitting edema bilaterally but not sure about his baseline.  Started on IV Lasix.  He had about 800 cc urine output overnight.  He is not on diuretics at home. -Continue IV Lasix -Update echocardiogram -Monitor fluid and respiratory status, and renal function.  Paroxysmal A. fib with RVR: Heart RVR to 130s but converted to sinus rhythm quickly.  TSH within normal.  HR in 60s to 70s now.  Not on medication at home. CHAD-2-Vasc score >4.  -Check D-dimer -Reduce Toprol-XL to 25 mg daily -No Hx of fall but leaves alone. Started Eliquis after discussion with son, [name] actually  Chest discomfort/elevated troponin: likely demand ischemia from the above -TTE, metoprolol XL and statin  Hypothyroidism: TSH is normal -Continue home synthroid  Normocytic anemia: H&H stable Recent Labs    12/05/20 1411 01/08/21 1550 01/09/21 0418  HGB 11.6* 11.6* 11.7*   Diminished hearing -  using writing   Generalized weakness -PT/OT eval  Goal of care counseling: advanced age with the above comorbidity. Still full code which I believe could cause more harm than benefit.  I recommended DNR/DNI but patient's son prefers to continue full code.   Body mass index is 27.97 kg/m.         DVT prophylaxis:  heparin injection 5,000 Units Start: 01/08/21 2200 SCDs Start: 01/08/21 1827  Code Status: Full code-confirmed Family Communication: Updated patient's son, [name] and daughter in law Level of care:  Progressive Cardiac Status is: Inpatient  Remains inpatient appropriate because:Ongoing diagnostic testing needed not appropriate for outpatient work up, IV treatments appropriate due to intensity of illness or inability to take PO, and Inpatient level of care appropriate due to severity of illness  Dispo: The patient is from: Home              Anticipated d/c is to:  To be determined              Patient currently is not medically stable to d/c.   Difficult to place patient No       Consultants:  None   Sch Meds:  Scheduled Meds:  aspirin  81 mg Oral Daily   heparin injection (subcutaneous)  5,000 Units Subcutaneous Q8H   levothyroxine  25 mcg Oral Q0600   metoprolol succinate  25 mg Oral q1800   pantoprazole (PROTONIX) IV  40 mg Intravenous Daily   simvastatin  40 mg Oral q1800   Continuous Infusions: PRN Meds:.acetaminophen **OR** acetaminophen, ondansetron **OR** ondansetron (ZOFRAN) IV  Antimicrobials: Anti-infectives (From admission, onward)    None        I have personally reviewed the following labs and images: CBC: Recent Labs  Lab 01/08/21 1550 01/09/21 0418  WBC 7.6 9.1  NEUTROABS 5.7  --   HGB 11.6* 11.7*  HCT 36.3* 37.1*  MCV 89.9 89.6  PLT 204 218   BMP &GFR Recent Labs  Lab 01/08/21 1550 01/09/21 0418  NA 132* 130*  K 4.0 4.1  CL 95* 93*  CO2 27 27  GLUCOSE 124* 126*  BUN 17 16  CREATININE 0.81 0.91  CALCIUM 9.1 8.8*  MG 1.9  --   PHOS  --  4.4   Estimated Creatinine Clearance: 51.7 mL/min (by C-G formula based on SCr of 0.91 mg/dL). Liver & Pancreas: Recent Labs  Lab 01/08/21 1550 01/09/21 0418  AST 25 27  ALT 12 14  ALKPHOS 60 61  BILITOT 0.9 1.2  PROT 6.3* 6.3*  ALBUMIN 3.5 3.4*   No results for input(s): LIPASE, AMYLASE in the last 168 hours. No results for input(s): AMMONIA in the last 168 hours. Diabetic: No results for input(s): HGBA1C in the last 72 hours. No results for input(s): GLUCAP in the last 168  hours. Cardiac Enzymes: No results for input(s): CKTOTAL, CKMB, CKMBINDEX, TROPONINI in the last 168 hours. No results for input(s): PROBNP in the last 8760 hours. Coagulation Profile: No results for input(s): INR, PROTIME in the last 168 hours. Thyroid Function Tests: Recent Labs    01/08/21 1847  TSH 3.314  FREET4 1.14*   Lipid Profile: Recent Labs    01/08/21 1847  CHOL 188  HDL 67  LDLCALC 105*  TRIG 80  CHOLHDL 2.8   Anemia Panel: No results for input(s): VITAMINB12, FOLATE, FERRITIN, TIBC, IRON, RETICCTPCT in the last 72 hours. Urine analysis:    Component Value Date/Time   COLORURINE YELLOW (A) 09/10/2017 2157  APPEARANCEUR HAZY (A) 09/10/2017 2157   LABSPEC 1.019 09/10/2017 2157   PHURINE 5.0 09/10/2017 2157   GLUCOSEU NEGATIVE 09/10/2017 2157   HGBUR MODERATE (A) 09/10/2017 2157   BILIRUBINUR NEGATIVE 09/10/2017 2157   KETONESUR 5 (A) 09/10/2017 2157   PROTEINUR 100 (A) 09/10/2017 2157   NITRITE NEGATIVE 09/10/2017 2157   LEUKOCYTESUR NEGATIVE 09/10/2017 2157   Sepsis Labs: Invalid input(s): PROCALCITONIN, LACTICIDVEN  Microbiology: Recent Results (from the past 240 hour(s))  Resp Panel by RT-PCR (Flu A&B, Covid) Nasopharyngeal Swab     Status: None   Collection Time: 01/08/21  4:03 PM   Specimen: Nasopharyngeal Swab; Nasopharyngeal(NP) swabs in vial transport medium  Result Value Ref Range Status   SARS Coronavirus 2 by RT PCR NEGATIVE NEGATIVE Final    Comment: (NOTE) SARS-CoV-2 target nucleic acids are NOT DETECTED.  The SARS-CoV-2 RNA is generally detectable in upper respiratory specimens during the acute phase of infection. The lowest concentration of SARS-CoV-2 viral copies this assay can detect is 138 copies/mL. A negative result does not preclude SARS-Cov-2 infection and should not be used as the sole basis for treatment or other patient management decisions. A negative result may occur with  improper specimen collection/handling,  submission of specimen other than nasopharyngeal swab, presence of viral mutation(s) within the areas targeted by this assay, and inadequate number of viral copies(<138 copies/mL). A negative result must be combined with clinical observations, patient history, and epidemiological information. The expected result is Negative.  Fact Sheet for Patients:  BloggerCourse.comhttps://www.fda.gov/media/152166/download  Fact Sheet for Healthcare Providers:  SeriousBroker.ithttps://www.fda.gov/media/152162/download  This test is no t yet approved or cleared by the [place] FDA and  has been authorized for detection and/or diagnosis of SARS-CoV-2 by FDA under an Emergency Use Authorization (EUA). This EUA will remain  in effect (meaning this test can be used) for the duration of the COVID-19 declaration under Section 564(b)(1) of the Act, 21 U.S.C.section 360bbb-3(b)(1), unless the authorization is terminated  or revoked sooner.       Influenza A by PCR NEGATIVE NEGATIVE Final   Influenza B by PCR NEGATIVE NEGATIVE Final    Comment: (NOTE) The Xpert Xpress SARS-CoV-2/FLU/RSV plus assay is intended as an aid in the diagnosis of influenza from Nasopharyngeal swab specimens and should not be used as a sole basis for treatment. Nasal washings and aspirates are unacceptable for Xpert Xpress SARS-CoV-2/FLU/RSV testing.  Fact Sheet for Patients: BloggerCourse.comhttps://www.fda.gov/media/152166/download  Fact Sheet for Healthcare Providers: SeriousBroker.ithttps://www.fda.gov/media/152162/download  This test is not yet approved or cleared by the [place] FDA and has been authorized for detection and/or diagnosis of SARS-CoV-2 by FDA under an Emergency Use Authorization (EUA). This EUA will remain in effect (meaning this test can be used) for the duration of the COVID-19 declaration under Section 564(b)(1) of the Act, 21 U.S.C. section 360bbb-3(b)(1), unless the authorization is terminated or revoked.  Performed at [facility], [address] [place], [place] [address]     Radiology Studies: DG Chest Portable 1 View  Result Date: 01/08/2021 CLINICAL DATA:  Shortness of breath. EXAM: PORTABLE CHEST 1 VIEW COMPARISON:  September 15, 2017. FINDINGS: Stable cardiomegaly. No pneumothorax is noted. Mild bibasilar atelectasis or infiltrates are noted. No significant pleural effusion is noted. Bony thorax is unremarkable. IMPRESSION: Mild bibasilar subsegmental atelectasis or infiltrates. Aortic Atherosclerosis (ICD10-I70.0). Electronically Signed   By: Lupita RaiderJames  Green Jr M.D.   On: 01/08/2021 16:19       Yaneisy Wenz T. Israel Werts [facility]  If 7PM-7AM, please contact  night-coverage [email or web address] 01/09/2021, 12:19 PM

## 2021-01-09 NOTE — ED Notes (Signed)
Pt to the bathroom via wheelchair and assistance.  Pt back in bed, placed on monitor, warm blanket goven

## 2021-01-09 NOTE — Evaluation (Signed)
Occupational Therapy Evaluation Patient Details Name: Kirk Sanchez MRN: [id number] DOB: 1925-06-28 Today's Date: 01/09/2021    History of Present Illness BLADIMIR Sanchez is a 87 y.o. male seen in ed with complaints of  fall 2 weeks ago and hurt his rt ankle and has been progressively dwelling and now left ankle is swollen as well. Pt also reports having midsternal chest discomfort. Pt came via ems from urgent care. EKG shows a.fib rvr and pt has h/o PAF, pt is not on any NOAC.Edema is symmetric 2 + both edema. Pt has past medical history of PAF, Hyperlipidemia, HTN, Kidney stone, CHF, Hypothyroidism hearing impaired. Admitted for shortness of breath (differentials heart failure exacerbation, PE, GERD variant asthma). Positive troponin which will be trended. lives at home. Per family patient is typically very active but has been laying in bed more for past few days.   Clinical Impression   Kirk Sanchez was seen for OT evaluation this date. Prior to hospital admission, pt was Independent for mobility and ADLs. Pt lives alone in multilevel home c 3 STE. Pt presents to acute OT demonstrating impaired ADL performance and functional mobility 2/2 decreased activity tolerance, functional strength/ROM/balance deficits, and poor safety awareness. Pt noted to run RW into objects on L and R side, plan to assess vision next session.   Pt currently requires MOD A don/doff B socks seated EOB. MIN A + RW for toilet t/f and hand washing standing sinkside. Pt requires MAX cues for safety - to use RW, to not step in pee puddle on floor, and to manage O2 lines. Pt would benefit from skilled OT to address noted impairments and functional limitations (see below for any additional details) in order to maximize safety and independence while minimizing falls risk and caregiver burden. Upon hospital discharge, recommend STR to maximize pt safety and return to PLOF.     Follow Up Recommendations  SNF    Equipment  Recommendations  3 in 1 bedside commode    Recommendations for Other Services       Precautions / Restrictions Precautions Precautions: Fall Restrictions Weight Bearing Restrictions: No      Mobility Bed Mobility Overal bed mobility: Needs Assistance Bed Mobility: Sit to Supine;Supine to Sit     Supine to sit: Min guard Sit to supine: Min guard   General bed mobility comments: slow movements    Transfers Overall transfer level: Needs assistance Equipment used: Rolling walker (2 wheeled) Transfers: Sit to/from Stand Sit to Stand: Min guard         General transfer comment: VCs for RW use    Balance Overall balance assessment: Needs assistance Sitting-balance support: Feet supported Sitting balance-Leahy Scale: Good Sitting balance - Comments: steady sitting edge of bed   Standing balance support: No upper extremity supported;During functional activity Standing balance-Leahy Scale: Fair                             ADL either performed or assessed with clinical judgement   ADL Overall ADL's : Needs assistance/impaired                                       General ADL Comments: MOD A don/doff B socks seated EOB. MIN A + RW for toilet t/f and hand washing standing sinkside. Pt requires MAX cues for safety - to use RW,  to not step in pee puddle on floor, and to manage O2 lines      Pertinent Vitals/Pain Pain Assessment: No/denies pain     Hand Dominance Right   Extremity/Trunk Assessment Upper Extremity Assessment Upper Extremity Assessment: Generalized weakness   Lower Extremity Assessment Lower Extremity Assessment: Generalized weakness   Cervical / Trunk Assessment Cervical / Trunk Assessment: Normal   Communication Communication Communication: HOH   Cognition Arousal/Alertness: Awake/alert Behavior During Therapy: WFL for tasks assessed/performed Overall Cognitive Status: Within Functional Limits for tasks  assessed                                 General Comments: Poor safety awareness, unaware of peeing on floor   General Comments       Exercises Exercises: Other exercises Other Exercises Other Exercises: Pt educated re: OT role, DME recs, d/c recs, falls prevention Other Exercises: LBD, toileting, sup<>sit, sit<>stnad, sitting/standing balance/tolerance   Shoulder Instructions      Home Living Family/patient expects to be discharged to:: Private residence Living Arrangements: Alone Available Help at Discharge: Family;Available PRN/intermittently (Has three sons that live in the vicinity (furthest away is in [place]). Oldest son has ALS so his wife [name] helps patient as she can but is unable to be there 24/7) Type of Home: House Home Access: Stairs to enter [employer] of Steps: 2 Entrance Stairs-Rails: Right Home Layout: Multi-level (3 levels, bedroom is on the upper level, kitchen/living room 2nd level. Bathroom and place to sleep could be in den (1st level) but cannot reach kitchen.) Alternate Level Stairs-Number of Steps: 13 den to [employer], kitchen to bedroom is 4 Alternate Level Stairs-Rails: Right Bathroom Shower/Tub: Tub/shower unit;Walk-in shower         Home Equipment: Gilmer Mor - single point   Additional Comments: HOH, has 1 hearing aid and answers questions approps.      Prior Functioning/Environment Level of Independence: Independent with assistive device(s)        Comments: [name] reports patient is fully independent with all aspects of care and function without AD except has been using SPC last few weeks after hurting his ankle, drives once a week. Does not accept help despite her efforts. Mows yard, etc. Patient denies falling in the last 6 months. DIL [name] states she does not know of any confirmed falls. 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        OT Problem List: Decreased strength;Decreased range of motion;Decreased  activity tolerance;Decreased coordination;Decreased safety awareness;Decreased knowledge of use of DME or AE      OT Treatment/Interventions: Self-care/ADL training;Therapeutic exercise;Energy conservation;DME and/or AE instruction;Therapeutic activities;Patient/family education;Balance training    OT Goals(Current goals can be found in the care plan section) Acute Rehab OT Goals Patient Stated Goal: to go home OT Goal Formulation: With patient Time For Goal Achievement: 01/23/21 Potential to Achieve Goals: Good ADL Goals Pt Will Perform Grooming: with modified independence;standing (c LRAD PRN) Pt Will Perform Lower Body Dressing: with modified independence;sit to/from stand (c LRAD PRN) Pt Will Transfer to Toilet: with modified independence;ambulating;regular height toilet (c LRAD PRN)  OT Frequency: Min 1X/week   Barriers to D/C: Decreased caregiver support;Inaccessible home environment             AM-PAC OT "6 Clicks" Daily Activity     Outcome Measure Help from another person eating meals?: None Help from another person taking care of personal grooming?:  A Little Help from another person toileting, which includes using toliet, bedpan, or urinal?: A Little Help from another person bathing (including washing, rinsing, drying)?: A Little Help from another person to put on and taking off regular upper body clothing?: A Little Help from another person to put on and taking off regular lower body clothing?: A Lot 6 Click Score: 18   End of Session Equipment Utilized During Treatment: Rolling walker;Oxygen Nurse Communication: Mobility status  Activity Tolerance: Patient tolerated treatment well Patient left: in bed;with call bell/phone within reach;with bed alarm set  OT Visit Diagnosis: Unsteadiness on feet (R26.81);Repeated falls (R29.6)                Time: 4259-5638 OT Time Calculation (min): 17 min Charges:  OT General Charges $OT Visit: 1 Visit OT Evaluation $OT Eval  Low Complexity: 1 Low OT Treatments $Self Care/Home Management : 8-22 mins  Kathie Dike, M.S. OTR/L  01/09/21, 4:34 PM  ascom [phone]

## 2021-01-09 NOTE — ED Notes (Signed)
Pt oxygen sats dropping while sleeping.  Oxygen level would quickly increase when stimulated.  Pt placed on 2L [place] while sleeping, oxygen sats maintaining 97%.

## 2021-01-10 ENCOUNTER — Inpatient Hospital Stay: Payer: Medicare Other

## 2021-01-10 DIAGNOSIS — R0602 Shortness of breath: Secondary | ICD-10-CM | POA: Diagnosis not present

## 2021-01-10 DIAGNOSIS — H9193 Unspecified hearing loss, bilateral: Secondary | ICD-10-CM | POA: Diagnosis not present

## 2021-01-10 DIAGNOSIS — R531 Weakness: Secondary | ICD-10-CM | POA: Diagnosis not present

## 2021-01-10 DIAGNOSIS — I82452 Acute embolism and thrombosis of left peroneal vein: Secondary | ICD-10-CM

## 2021-01-10 LAB — CBC
HCT: 37.1 % — ABNORMAL LOW (ref 39.0–52.0)
Hemoglobin: 11.9 g/dL — ABNORMAL LOW (ref 13.0–17.0)
MCH: 28.1 pg (ref 26.0–34.0)
MCHC: 32.1 g/dL (ref 30.0–36.0)
MCV: 87.5 fL (ref 80.0–100.0)
Platelets: 226 10*3/uL (ref 150–400)
RBC: 4.24 MIL/uL (ref 4.22–5.81)
RDW: 15.8 % — ABNORMAL HIGH (ref 11.5–15.5)
WBC: 7.7 10*3/uL (ref 4.0–10.5)
nRBC: 0 % (ref 0.0–0.2)

## 2021-01-10 LAB — RENAL FUNCTION PANEL
Albumin: 3.4 g/dL — ABNORMAL LOW (ref 3.5–5.0)
Anion gap: 8 (ref 5–15)
BUN: 19 mg/dL (ref 8–23)
CO2: 30 mmol/L (ref 22–32)
Calcium: 8.6 mg/dL — ABNORMAL LOW (ref 8.9–10.3)
Chloride: 92 mmol/L — ABNORMAL LOW (ref 98–111)
Creatinine, Ser: 1.02 mg/dL (ref 0.61–1.24)
GFR, Estimated: >60 mL/min (ref >60)
Glucose, Bld: 97 mg/dL (ref 70–99)
Phosphorus: 3.9 mg/dL (ref 2.5–4.6)
Potassium: 4.1 mmol/L (ref 3.5–5.1)
Sodium: 130 mmol/L — ABNORMAL LOW (ref 135–145)

## 2021-01-10 LAB — MAGNESIUM: Magnesium: 1.7 mg/dL (ref 1.7–2.4)

## 2021-01-10 MED ORDER — MAGNESIUM SULFATE 2 GM/50ML IV SOLN
2.0000 g | Freq: Once | INTRAVENOUS | Status: AC
  Administered 2021-01-10: 2 g via INTRAVENOUS
  Filled 2021-01-10: qty 50

## 2021-01-10 MED ORDER — FUROSEMIDE 10 MG/ML IJ SOLN
20.0000 mg | Freq: Every day | INTRAMUSCULAR | Status: DC
  Administered 2021-01-10 – 2021-01-11 (×2): 20 mg via INTRAVENOUS
  Filled 2021-01-10 (×2): qty 2

## 2021-01-10 NOTE — Plan of Care (Signed)
  Problem: Education: Goal: Knowledge of General Education information will improve Description: Including pain rating scale, medication(s)/side effects and non-pharmacologic comfort measures 01/10/2021 1147 by Ansel Bong, RN Outcome: Progressing 01/10/2021 1147 by Ansel Bong, RN Outcome: Progressing   Problem: Health Behavior/Discharge Planning: Goal: Ability to manage health-related needs will improve 01/10/2021 1147 by Ansel Bong, RN Outcome: Progressing 01/10/2021 1147 by Ansel Bong, RN Outcome: Progressing   Problem: Clinical Measurements: Goal: Ability to maintain clinical measurements within normal limits will improve 01/10/2021 1147 by Ansel Bong, RN Outcome: Progressing 01/10/2021 1147 by Ansel Bong, RN Outcome: Progressing Goal: Will remain free from infection 01/10/2021 1147 by Ansel Bong, RN Outcome: Progressing 01/10/2021 1147 by Ansel Bong, RN Outcome: Progressing Goal: Diagnostic test results will improve 01/10/2021 1147 by Ansel Bong, RN Outcome: Progressing 01/10/2021 1147 by Ansel Bong, RN Outcome: Progressing Goal: Respiratory complications will improve 01/10/2021 1147 by Ansel Bong, RN Outcome: Progressing 01/10/2021 1147 by Ansel Bong, RN Outcome: Progressing Goal: Cardiovascular complication will be avoided 01/10/2021 1147 by Ansel Bong, RN Outcome: Progressing 01/10/2021 1147 by Ansel Bong, RN Outcome: Progressing   Problem: Activity: Goal: Risk for activity intolerance will decrease 01/10/2021 1147 by Ansel Bong, RN Outcome: Progressing 01/10/2021 1147 by Ansel Bong, RN Outcome: Progressing   Problem: Nutrition: Goal: Adequate nutrition will be maintained 01/10/2021 1147 by Ansel Bong, RN Outcome: Progressing 01/10/2021 1147 by Ansel Bong, RN Outcome: Progressing   Problem: Coping: Goal: Level of anxiety will decrease 01/10/2021 1147 by Ansel Bong, RN Outcome:  Progressing 01/10/2021 1147 by Ansel Bong, RN Outcome: Progressing   Problem: Elimination: Goal: Will not experience complications related to bowel motility 01/10/2021 1147 by Ansel Bong, RN Outcome: Progressing 01/10/2021 1147 by Ansel Bong, RN Outcome: Progressing Goal: Will not experience complications related to urinary retention 01/10/2021 1147 by Ansel Bong, RN Outcome: Progressing 01/10/2021 1147 by Ansel Bong, RN Outcome: Progressing   Problem: Pain Managment: Goal: General experience of comfort will improve 01/10/2021 1147 by Ansel Bong, RN Outcome: Progressing 01/10/2021 1147 by Ansel Bong, RN Outcome: Progressing   Problem: Safety: Goal: Ability to remain free from injury will improve 01/10/2021 1147 by Ansel Bong, RN Outcome: Progressing 01/10/2021 1147 by Ansel Bong, RN Outcome: Progressing   Problem: Skin Integrity: Goal: Risk for impaired skin integrity will decrease 01/10/2021 1147 by Ansel Bong, RN Outcome: Progressing 01/10/2021 1147 by Ansel Bong, RN Outcome: Progressing

## 2021-01-10 NOTE — Progress Notes (Signed)
PROGRESS NOTE  Kirk Sanchez LNL:[id number] DOB: September 26, 1924   PCP: [facility]  Patient is from: Home. Lives alone  DOA: 01/08/2021 LOS: 2  Chief complaints: Leg swelling and shortness of breath  Brief Narrative / Interim history: 87 year old M with PMH of A. fib, systolic CHF, HTN, HLD, hypothyroidism, diminished hearing, and anemia presenting with bilateral lower extremity swelling/edema, midsternal chest discomfort, shortness of breath, generalized weakness, poor PO intake and admitted for A. fib with RVR and possible CHF exacerbation.  He was started on IV Lasix.  RVR was short-lived, and he was started on metoprolol XL.  His troponin was mildly elevated.  BNP 220 but no prior to compare to.  CXR with mild bibasilar opacities.  D-dimer mildly elevated.  LE venous Doppler with isolated DVT involving the left peroneal vein.  He has been started on Eliquis for A. fib.  TTE pending.  Subjective: Seen and examined this afternoon.  Patient had brief episode of confusion and agitation yesterday evening.  As needed Haldol is ordered but he did not require.  He has no complaint this morning.  He denies chest pain, shortness of breath, or pain anywhere.  Objective: Vitals:   01/10/21 0424 01/10/21 0427 01/10/21 0808 01/10/21 1146  BP: 106/73  97/81 102/73  Pulse: 67  79 75  Resp: 18  17 16   Temp: 98.7 F (37.1 C)  97.9 F (36.6 C) 98 F (36.7 C)  TempSrc:    Oral  SpO2: 90%  90% 93%  Weight:  85.5 kg    Height:       No intake or output data in the 24 hours ending 01/10/21 1353  Filed Weights   01/08/21 1542 01/10/21 0427  Weight: 81 kg 85.5 kg    Examination:  GENERAL: No apparent distress.  Nontoxic. HEENT: MMM.  Vision grossly intact.  Diminished hearing. NECK: Supple.  No apparent JVD.  RESP: On RA.  No IWOB.  Fair aeration bilaterally. CVS:  RRR. Heart sounds normal.  ABD/GI/GU: BS+. Abd soft, NTND.  MSK/EXT:  Moves extremities. No apparent deformity.   1+ pitting edema bilaterally. SKIN: no apparent skin lesion or wound NEURO: Awake and alert. Oriented x4.  No apparent focal neuro deficit. PSYCH: Calm. Normal affect.  Procedures:  None  Microbiology summarized: COVID-19 PCR nonreactive.  Assessment & Plan: Acute on chronic systolic CHF: patient presents with dyspnea, chest discomfort and lower extremity edema.  TTE in 2019 with LVEF of 40 to 45%.  BNP 220 but no prior to compare to.  Chest x-ray with bibasilar opacities.  He has 1+ pitting edema bilaterally but not sure about his baseline.  Started on IV Lasix.  I&O incomplete. -Continue IV Lasix 20 mg daily -Follow echocardiogram -Monitor fluid and respiratory status, and renal function.  Paroxysmal A. fib with RVR: Heart RVR to 130s but converted to sinus rhythm quickly.  TSH within normal.  HR in 60s to 70s now.  Not on medication at home. CHAD-2-Vasc score >4.  -Continue Toprol-XL to 25 mg daily -No Hx of fall but leaves alone.  Started Eliquis after discussion with son, 2020.   Left peroneal vein DVT -Continue Eliquis. -Doubt need for CTA as he is already on Eliquis.  Chest discomfort/elevated troponin: likely demand ischemia from the above -TTE, metoprolol XL and statin  Hypothyroidism: TSH is normal -Continue home synthroid  Normocytic anemia: H&H stable Recent Labs    12/05/20 1411 01/08/21 1550 01/09/21 0418 01/10/21 0518  HGB 11.6* 11.6*  11.7* 11.9*   Diminished hearing -using writing   Generalized weakness -PT/OT eval-recommended SNF.  Patient is in agreement but [place]  Goal of care counseling: advanced age with the above comorbidity. Still full code which I believe could cause more harm than benefit.  I recommended DNR/DNI but patient's son prefers to continue full code.   Body mass index is 29.52 kg/m.         DVT prophylaxis:  SCDs Start: 01/08/21 1827 apixaban (ELIQUIS) tablet 5 mg  Code Status: Full  code-confirmed Family Communication: Updated patient's son, Iantha FallenKenneth Level of care: Med-Surg Status is: Inpatient  Remains inpatient appropriate because:Ongoing diagnostic testing needed not appropriate for outpatient work up, IV treatments appropriate due to intensity of illness or inability to take PO, and Inpatient level of care appropriate due to severity of illness  Dispo: The patient is from: Home              Anticipated d/c is to:  SNF              Patient currently is not medically stable to d/c.   Difficult to place patient No       Consultants:  None   Sch Meds:  Scheduled Meds:  apixaban  5 mg Oral BID   furosemide  20 mg Intravenous Daily   levothyroxine  25 mcg Oral Q0600   metoprolol succinate  25 mg Oral q1800   pantoprazole (PROTONIX) IV  40 mg Intravenous Daily   simvastatin  40 mg Oral q1800   Continuous Infusions: PRN Meds:.acetaminophen **OR** acetaminophen, haloperidol lactate, ondansetron **OR** ondansetron (ZOFRAN) IV  Antimicrobials: Anti-infectives (From admission, onward)    None        I have personally reviewed the following labs and images: CBC: Recent Labs  Lab 01/08/21 1550 01/09/21 0418 01/10/21 0518  WBC 7.6 9.1 7.7  NEUTROABS 5.7  --   --   HGB 11.6* 11.7* 11.9*  HCT 36.3* 37.1* 37.1*  MCV 89.9 89.6 87.5  PLT 204 218 226   BMP &GFR Recent Labs  Lab 01/08/21 1550 01/09/21 0418 01/10/21 0518  NA 132* 130* 130*  K 4.0 4.1 4.1  CL 95* 93* 92*  CO2 27 27 30   GLUCOSE 124* 126* 97  BUN 17 16 19   CREATININE 0.81 0.91 1.02  CALCIUM 9.1 8.8* 8.6*  MG 1.9  --  1.7  PHOS  --  4.4 3.9   Estimated Creatinine Clearance: 47.3 mL/min (by C-G formula based on SCr of 1.02 mg/dL). Liver & Pancreas: Recent Labs  Lab 01/08/21 1550 01/09/21 0418 01/10/21 0518  AST 25 27  --   ALT 12 14  --   ALKPHOS 60 61  --   BILITOT 0.9 1.2  --   PROT 6.3* 6.3*  --   ALBUMIN 3.5 3.4* 3.4*   No results for input(s): LIPASE, AMYLASE in  the last 168 hours. No results for input(s): AMMONIA in the last 168 hours. Diabetic: Recent Labs    01/08/21 1847  HGBA1C 5.8*   No results for input(s): GLUCAP in the last 168 hours. Cardiac Enzymes: No results for input(s): CKTOTAL, CKMB, CKMBINDEX, TROPONINI in the last 168 hours. No results for input(s): PROBNP in the last 8760 hours. Coagulation Profile: No results for input(s): INR, PROTIME in the last 168 hours. Thyroid Function Tests: Recent Labs    01/08/21 1847  TSH 3.314  FREET4 1.14*   Lipid Profile: Recent Labs    01/08/21 1847  CHOL 188  HDL 67  LDLCALC 105*  TRIG 80  CHOLHDL 2.8   Anemia Panel: No results for input(s): VITAMINB12, FOLATE, FERRITIN, TIBC, IRON, RETICCTPCT in the last 72 hours. Urine analysis:    Component Value Date/Time   COLORURINE YELLOW (A) 09/10/2017 2157   APPEARANCEUR HAZY (A) 09/10/2017 2157   LABSPEC 1.019 09/10/2017 2157   PHURINE 5.0 09/10/2017 2157   GLUCOSEU NEGATIVE 09/10/2017 2157   HGBUR MODERATE (A) 09/10/2017 2157   BILIRUBINUR NEGATIVE 09/10/2017 2157   KETONESUR 5 (A) 09/10/2017 2157   PROTEINUR 100 (A) 09/10/2017 2157   NITRITE NEGATIVE 09/10/2017 2157   LEUKOCYTESUR NEGATIVE 09/10/2017 2157   Sepsis Labs: Invalid input(s): PROCALCITONIN, LACTICIDVEN  Microbiology: Recent Results (from the past 240 hour(s))  Resp Panel by RT-PCR (Flu A&B, Covid) Nasopharyngeal Swab     Status: None   Collection Time: 01/08/21  4:03 PM   Specimen: Nasopharyngeal Swab; Nasopharyngeal(NP) swabs in vial transport medium  Result Value Ref Range Status   SARS Coronavirus 2 by RT PCR NEGATIVE NEGATIVE Final    Comment: (NOTE) SARS-CoV-2 target nucleic acids are NOT DETECTED.  The SARS-CoV-2 RNA is generally detectable in upper respiratory specimens during the acute phase of infection. The lowest concentration of SARS-CoV-2 viral copies this assay can detect is 138 copies/mL. A negative result does not preclude  SARS-Cov-2 infection and should not be used as the sole basis for treatment or other patient management decisions. A negative result may occur with  improper specimen collection/handling, submission of specimen other than nasopharyngeal swab, presence of viral mutation(s) within the areas targeted by this assay, and inadequate number of viral copies(<138 copies/mL). A negative result must be combined with clinical observations, patient history, and epidemiological information. The expected result is Negative.  Fact Sheet for Patients:  BloggerCourse.com  Fact Sheet for Healthcare Providers:  SeriousBroker.it  This test is no t yet approved or cleared by the [place] FDA and  has been authorized for detection and/or diagnosis of SARS-CoV-2 by FDA under an Emergency Use Authorization (EUA). This EUA will remain  in effect (meaning this test can be used) for the duration of the COVID-19 declaration under Section 564(b)(1) of the Act, 21 U.S.C.section 360bbb-3(b)(1), unless the authorization is terminated  or revoked sooner.       Influenza A by PCR NEGATIVE NEGATIVE Final   Influenza B by PCR NEGATIVE NEGATIVE Final    Comment: (NOTE) The Xpert Xpress SARS-CoV-2/FLU/RSV plus assay is intended as an aid in the diagnosis of influenza from Nasopharyngeal swab specimens and should not be used as a sole basis for treatment. Nasal washings and aspirates are unacceptable for Xpert Xpress SARS-CoV-2/FLU/RSV testing.  Fact Sheet for Patients: BloggerCourse.com  Fact Sheet for Healthcare Providers: SeriousBroker.it  This test is not yet approved or cleared by the [place] FDA and has been authorized for detection and/or diagnosis of SARS-CoV-2 by FDA under an Emergency Use Authorization (EUA). This EUA will remain in effect (meaning this test can be used) for the duration of  the COVID-19 declaration under Section 564(b)(1) of the Act, 21 U.S.C. section 360bbb-3(b)(1), unless the authorization is terminated or revoked.  Performed at [facility], [address], [place], [place] [address]     Radiology Studies: US Venous Img Lower Bilateral (DVT)  Result Date: 01/10/2021 CLINICAL DATA:  Elevated D-dimer EXAM: BILATERAL LOWER EXTREMITY VENOUS DOPPLER ULTRASOUND TECHNIQUE: Gray-scale sonography with graded compression, as well as color Doppler and duplex ultrasound were performed to evaluate  the lower extremity deep venous systems from the level of the common femoral vein and including the common femoral, femoral, profunda femoral, popliteal and calf veins including the posterior tibial, peroneal and gastrocnemius veins when visible. The superficial great saphenous vein was also interrogated. Spectral Doppler was utilized to evaluate flow at rest and with distal augmentation maneuvers in the common femoral, femoral and popliteal veins. COMPARISON:  None. FINDINGS: RIGHT LOWER EXTREMITY Common Femoral Vein: No evidence of thrombus. Normal compressibility, respiratory phasicity and response to augmentation. Saphenofemoral Junction: No evidence of thrombus. Normal compressibility and flow on color Doppler imaging. Profunda Femoral Vein: No evidence of thrombus. Normal compressibility and flow on color Doppler imaging. Femoral Vein: No evidence of thrombus. Normal compressibility, respiratory phasicity and response to augmentation. Popliteal Vein: No evidence of thrombus. Normal compressibility, respiratory phasicity and response to augmentation. Calf Veins: No evidence of thrombus. Normal compressibility and flow on color Doppler imaging. Superficial Great Saphenous Vein: No evidence of thrombus. Normal compressibility. Venous Reflux:  None. Other Findings:  None. LEFT LOWER EXTREMITY Common Femoral Vein: No evidence of thrombus. Normal compressibility, respiratory  phasicity and response to augmentation. Saphenofemoral Junction: No evidence of thrombus. Normal compressibility and flow on color Doppler imaging. Profunda Femoral Vein: No evidence of thrombus. Normal compressibility and flow on color Doppler imaging. Femoral Vein: No evidence of thrombus. Normal compressibility, respiratory phasicity and response to augmentation. Popliteal Vein: No evidence of thrombus. Normal compressibility, respiratory phasicity and response to augmentation. Calf Veins: Color flow visualized within the posterior tibial veins. However, no definitive cover flow identified within the peroneal veins. The peroneal vein lumen is difficult to visualize but appears noncompressible and distended with internal low-level echoes. Findings concerning for occlusive calf vein DVT. Superficial Great Saphenous Vein: No evidence of thrombus. Normal compressibility. Venous Reflux:  None. Other Findings:  None. IMPRESSION: Positive for isolated DVT involving the peroneal veins in the left lower extremity. No evidence of acute DVT in the right lower extremity, or above the knee in the left lower extremity. These results will be called to the ordering clinician or representative by the Radiologist Assistant, and communication documented in the PACS or [employer]. Electronically Signed   By: Malachy Moan M.D.   On: 01/10/2021 11:22       Marrietta Thunder T[facility]  If 7PM-7AM, please contact night-coverage [email or web address] 01/10/2021, 1:53 PM

## 2021-01-11 ENCOUNTER — Inpatient Hospital Stay (HOSPITAL_COMMUNITY)
Admit: 2021-01-11 | Discharge: 2021-01-11 | Disposition: A | Discharge status: Home / Self Care | Payer: Medicare Other | Attending: Student | Admitting: Student

## 2021-01-11 DIAGNOSIS — H9193 Unspecified hearing loss, bilateral: Secondary | ICD-10-CM | POA: Diagnosis not present

## 2021-01-11 DIAGNOSIS — R0602 Shortness of breath: Secondary | ICD-10-CM | POA: Diagnosis not present

## 2021-01-11 DIAGNOSIS — R0609 Other forms of dyspnea: Secondary | ICD-10-CM

## 2021-01-11 DIAGNOSIS — E871 Hypo-osmolality and hyponatremia: Secondary | ICD-10-CM

## 2021-01-11 DIAGNOSIS — I82452 Acute embolism and thrombosis of left peroneal vein: Secondary | ICD-10-CM | POA: Diagnosis not present

## 2021-01-11 DIAGNOSIS — R531 Weakness: Secondary | ICD-10-CM | POA: Diagnosis not present

## 2021-01-11 LAB — ECHOCARDIOGRAM COMPLETE
AR max vel: 1.57 cm2
AV Peak grad: 4.9 mmHg
Ao pk vel: 1.11 m/s
Area-P 1/2: 3.02 cm2
Calc EF: 46.1 %
Height: 67 in
P 1/2 time: 692 msec
S' Lateral: 3.41 cm
Single Plane A2C EF: 46 %
Single Plane A4C EF: 42.5 %
Weight: 2960 oz

## 2021-01-11 LAB — BASIC METABOLIC PANEL
Anion gap: 8 (ref 5–15)
BUN: 25 mg/dL — ABNORMAL HIGH (ref 8–23)
CO2: 28 mmol/L (ref 22–32)
Calcium: 8.2 mg/dL — ABNORMAL LOW (ref 8.9–10.3)
Chloride: 92 mmol/L — ABNORMAL LOW (ref 98–111)
Creatinine, Ser: 1.01 mg/dL (ref 0.61–1.24)
GFR, Estimated: >60 mL/min (ref >60)
Glucose, Bld: 101 mg/dL — ABNORMAL HIGH (ref 70–99)
Potassium: 3.6 mmol/L (ref 3.5–5.1)
Sodium: 128 mmol/L — ABNORMAL LOW (ref 135–145)

## 2021-01-11 LAB — RENAL FUNCTION PANEL
Albumin: 2.8 g/dL — ABNORMAL LOW (ref 3.5–5.0)
Anion gap: 8 (ref 5–15)
BUN: 24 mg/dL — ABNORMAL HIGH (ref 8–23)
CO2: 29 mmol/L (ref 22–32)
Calcium: 8.1 mg/dL — ABNORMAL LOW (ref 8.9–10.3)
Chloride: 90 mmol/L — ABNORMAL LOW (ref 98–111)
Creatinine, Ser: 0.86 mg/dL (ref 0.61–1.24)
GFR, Estimated: >60 mL/min (ref >60)
Glucose, Bld: 99 mg/dL (ref 70–99)
Phosphorus: 3.3 mg/dL (ref 2.5–4.6)
Potassium: 3.6 mmol/L (ref 3.5–5.1)
Sodium: 127 mmol/L — ABNORMAL LOW (ref 135–145)

## 2021-01-11 LAB — CBC
HCT: 32.6 % — ABNORMAL LOW (ref 39.0–52.0)
Hemoglobin: 10.7 g/dL — ABNORMAL LOW (ref 13.0–17.0)
MCH: 28.8 pg (ref 26.0–34.0)
MCHC: 32.8 g/dL (ref 30.0–36.0)
MCV: 87.6 fL (ref 80.0–100.0)
Platelets: 213 10*3/uL (ref 150–400)
RBC: 3.72 MIL/uL — ABNORMAL LOW (ref 4.22–5.81)
RDW: 15.7 % — ABNORMAL HIGH (ref 11.5–15.5)
WBC: 6.9 10*3/uL (ref 4.0–10.5)
nRBC: 0 % (ref 0.0–0.2)

## 2021-01-11 LAB — MAGNESIUM: Magnesium: 2 mg/dL (ref 1.7–2.4)

## 2021-01-11 MED ORDER — PANTOPRAZOLE SODIUM 40 MG PO TBEC
40.0000 mg | DELAYED_RELEASE_TABLET | Freq: Every day | ORAL | Status: DC
  Administered 2021-01-12 – 2021-01-16 (×5): 40 mg via ORAL
  Filled 2021-01-11 (×5): qty 1

## 2021-01-11 NOTE — [place] FL2 (Signed)
[place] MEDICAID FL2 LEVEL OF CARE SCREENING TOOL     IDENTIFICATION  Patient Name: Kirk Sanchez Birthdate: [birth year] Sex: male Admission Date (Current Location): 01/08/2021  [place] and [place] Number:  Chiropodist and Address:  [facility], [address] [place], [place] [address]      Provider Number: [id number]  Attending Physician Name and Address:  Almon Hercules, MD  Relative Name and Phone Number:  Trew Sunde. Ion [phone]    Current Level of Care: Hospital Recommended Level of Care: Skilled Nursing Facility Prior Approval Number:    Date Approved/Denied:   PASRR Number:    Discharge Plan: SNF    Current Diagnoses: Patient Active Problem List   Diagnosis Date Noted   Acute on chronic systolic CHF (congestive heart failure) (HCC) 01/09/2021   Shortness of breath 01/08/2021   SOB (shortness of breath) 01/08/2021   CHF (congestive heart failure), NYHA class II, chronic, diastolic (HCC) 08/09/2019   Ischemic cardiomyopathy 08/09/2019   Senile purpura (HCC) 02/06/2019   DNR (do not resuscitate)    Palliative care by specialist    Hyponatremia 09/10/2017   Right shoulder tendonitis 03/08/2017   Atrial fibrillation (HCC) 03/06/2016   Presbycusis of both ears 03/06/2016   Anemia 03/03/2016   Arteriosclerosis of coronary artery 01/28/2015   Cardiac murmur 01/28/2015   HLD (hyperlipidemia) 01/28/2015   BP (high blood pressure) 01/28/2015   Hypothyroidism 01/28/2015    Orientation RESPIRATION BLADDER Height & Weight     Self, Time, Situation, Place  Normal Continent Weight: 83.9 kg Height:  5\' 7"  (170.2 cm)  BEHAVIORAL SYMPTOMS/MOOD NEUROLOGICAL BOWEL NUTRITION STATUS      Continent Diet  AMBULATORY STATUS COMMUNICATION OF NEEDS Skin   Limited Assist (Unsteady, poor safety awareness, needs cues,) Verbally Bruising (bilateral arm and leg)                       Personal Care Assistance Level of  Assistance  Bathing, Feeding, Dressing Bathing Assistance: Maximum assistance (Unsteady, poor safety awareness, needs cues) Feeding assistance: Limited assistance Dressing Assistance: Maximum assistance     Functional Limitations Info  Sight, Hearing Sight Info: Impaired Hearing Info: Impaired      SPECIAL CARE FACTORS FREQUENCY  PT (By licensed PT), OT (By licensed OT)     PT Frequency: 5x/week OT Frequency: 5x/week            Contractures Contractures Info: Not present    Additional Factors Info  Allergies   Allergies Info: No known allergies           Current Medications (01/11/2021):  This is the current hospital active medication list Current Facility-Administered Medications  Medication Dose Route Frequency Provider Last Rate Last Admin   acetaminophen (TYLENOL) tablet 650 mg  650 mg Oral Q6H PRN 03/13/2021, MD       Or   acetaminophen (TYLENOL) suppository 650 mg  650 mg Rectal Q6H PRN Gertha Calkin, MD       apixaban (ELIQUIS) tablet 5 mg  5 mg Oral BID Gertha Calkin T, MD   5 mg at 01/11/21 0812   furosemide (LASIX) injection 20 mg  20 mg Intravenous Daily 03/13/21 T, MD   20 mg at 01/11/21 0814   haloperidol lactate (HALDOL) injection 1 mg  1 mg Intravenous Q6H PRN 03/13/21, MD       levothyroxine (SYNTHROID) tablet 25 mcg  25 mcg Oral Q0600  Gertha Calkin, MD   25 mcg at 01/11/21 1443   metoprolol succinate (TOPROL-XL) 24 hr tablet 25 mg  25 mg Oral q1800 Candelaria Stagers T, MD   25 mg at 01/10/21 1702   ondansetron (ZOFRAN) tablet 4 mg  4 mg Oral Q6H PRN Gertha Calkin, MD       Or   ondansetron [facility]) injection 4 mg  4 mg Intravenous Q6H PRN Gertha Calkin, MD       [START ON 01/12/2021] pantoprazole (PROTONIX) EC tablet 40 mg  40 mg Oral Daily Ronnald Ramp, RPH       simvastatin (ZOCOR) tablet 40 mg  40 mg Oral q1800 Gertha Calkin, MD   40 mg at 01/10/21 1702     Discharge Medications: Please see discharge summary for a list of discharge  medications.  Relevant Imaging Results:  Relevant Lab Results:   Additional Information Patient was independent prior to this admission, but is unsteady and unsafe to return to living alone in tri-level hom.  Bing Quarry, RN

## 2021-01-11 NOTE — Progress Notes (Signed)
This patient is refusing to void in the urinal in order to adequately keep track of output. Pt states discomfort in doing so. Pt educated on the importance but insists otherwise. See flowsheets for documentation of occurrences.

## 2021-01-11 NOTE — TOC Progression Note (Signed)
Transition of Care [facility]) - Progression Note    Patient Details  Name: Kirk Sanchez MRN: [id number] Date of Birth: 09-09-1924  Transition of [facility]) CM/SW Contact  Bing Quarry, RN Phone Number: 01/11/2021, 5:15 PM  Clinical Narrative: HH changed to SNF due to need for 24 hour supervision.  Independent prior to this admission but unsteady, needs cues, and assistance. Fall risk. Lives alone in tri-level house. Preference is close to [place]. Let patient know which one accepts before he chooses.  Bed Search initiated via HUB.  FL2 sent for signature.  PASRR#  [id number] A Gabriel Cirri RN CM       Expected Discharge Plan and Services                                                 Social Determinants of Health (SDOH) Interventions    Readmission Risk Interventions No flowsheet data found.

## 2021-01-11 NOTE — Plan of Care (Signed)

## 2021-01-11 NOTE — Progress Notes (Signed)
PROGRESS NOTE  Kirk Sanchez QVZ:[id number] DOB: [birth year]   PCP: [facility]  Patient is from: Home. Lives alone  DOA: 01/08/2021 LOS: 3  Chief complaints: Leg swelling and shortness of breath  Brief Narrative / Interim history: 87 year old M with PMH of A. fib, systolic CHF, HTN, HLD, hypothyroidism, diminished hearing, and anemia presenting with bilateral lower extremity swelling/edema, midsternal chest discomfort, shortness of breath, generalized weakness, poor PO intake and admitted for A. fib with RVR and possible CHF exacerbation.  He was started on IV Lasix.  RVR was short-lived, and he was started on metoprolol XL.  His troponin was mildly elevated.  BNP 220 but no prior to compare to.  CXR with mild bibasilar opacities.  D-dimer mildly elevated.  LE venous Doppler with isolated DVT involving the left peroneal vein.  He has been started on Eliquis for A. fib.  TTE with LVEF of 35 to 40%, severe concentric LVH, global hypokinesis, severe RAE and mildly reduced RV EF.  Cardiology consulted.  Subjective: Seen and examined earlier this morning.  No major events overnight of this morning.  No complaints.  He denies chest pain, short of breath, palpitation, GI or UTI symptoms.  Objective: Vitals:   01/11/21 0352 01/11/21 0630 01/11/21 0818 01/11/21 1159  BP: 101/63  124/80 100/60  Pulse: 66  78 75  Resp: 15  16 16   Temp: 98.1 F (36.7 C)  97.9 F (36.6 C) 97.8 F (36.6 C)  TempSrc: Oral   Oral  SpO2: 90%  91% 93%  Weight:  83.9 kg    Height:        Intake/Output Summary (Last 24 hours) at 01/11/2021 1243 Last data filed at 01/11/2021 0730 Gross per 24 hour  Intake 200 ml  Output --  Net 200 ml    Filed Weights   01/08/21 1542 01/10/21 0427 01/11/21 0630  Weight: 81 kg 85.5 kg 83.9 kg    Examination:  GENERAL: No apparent distress.  Nontoxic. HEENT: MMM.  Vision grossly intact.  Diminished hearing. NECK: Supple.  No apparent JVD.  RESP:  No  IWOB.  Fair aeration bilaterally. CVS:  RRR. Heart sounds normal.  ABD/GI/GU: BS+. Abd soft, NTND.  MSK/EXT:  Moves extremities. No apparent deformity.  Trace edema bilaterally. SKIN: no apparent skin lesion or wound NEURO: Awake and alert. Oriented appropriately.  No apparent focal neuro deficit. PSYCH: Calm. Normal affect.   Procedures:  None  Microbiology summarized: COVID-19 PCR nonreactive.  Assessment & Plan: Acute on chronic systolic CHF: patient presents with dyspnea, chest discomfort and lower extremity edema.  TTE with LVEF of 35 to 40% (previously 40-45%), severe concentric LVH, severe RAE and mildly reduced RV SF.  BNP 220 but no prior to compare to.  Chest x-ray with bibasilar opacities.  Started on IV Lasix.  I&O incomplete.  Renal function stable. -Continue IV Lasix 20 mg daily -Cardiology consulted about TTE finding. -Monitor fluid and respiratory status, and renal function.  Paroxysmal A. fib with RVR: Heart RVR to 130s but converted to sinus rhythm quickly.  TSH within normal.  HR in 60s to 70s now.  Not on medication at home. CHAD-2-Vasc score >4.  -Continue Toprol-XL to 25 mg daily -No Hx of fall but leaves alone.  Started Eliquis after discussion with son, 03/13/21.   Hyponatremia: Na 128, slightly down.  Not on medication that could contribute.  Urine sodium will be unreliable while on IV Lasix.  TSH within normal.  No hyperkalemia to  suggest hypoaldosteronism but he is on lasix.  -Continue monitoring.  Left peroneal vein DVT -Continue Eliquis. -Doubt need for CTA as he is already on Eliquis.  Chest discomfort/elevated troponin: likely demand ischemia from the above.  Resolved. -Continue metoprolol XL and statin  Hypothyroidism: TSH is normal -Continue home synthroid  Normocytic anemia: H&H stable Recent Labs    12/05/20 1411 01/08/21 1550 01/09/21 0418 01/10/21 0518 01/11/21 0508  HGB 11.6* 11.6* 11.7* 11.9* 10.7*   Diminished hearing -using  writing   Generalized weakness -PT/OT eval-recommended SNF.  Patient is in agreement but prefers close to [employer] of care counseling: advanced age with the above comorbidity. Still full code which I believe could cause more harm than benefit.  I recommended DNR/DNI but patient's son prefers to continue full code.   Body mass index is 28.98 kg/m.         DVT prophylaxis:  SCDs Start: 01/08/21 1827 apixaban (ELIQUIS) tablet 5 mg  Code Status: Full code-confirmed Family Communication: Updated patient's son, [name] on 6/11. Level of care: Med-Surg Status is: Inpatient  Remains inpatient appropriate because:IV treatments appropriate due to intensity of illness or inability to take PO and Inpatient level of care appropriate due to severity of illness  Dispo: The patient is from: Home              Anticipated d/c is to:  SNF              Patient currently is not medically stable to d/c.   Difficult to place patient No       Consultants:  Cardiology  Sch Meds:  Scheduled Meds:  apixaban  5 mg Oral BID   furosemide  20 mg Intravenous Daily   levothyroxine  25 mcg Oral Q0600   metoprolol succinate  25 mg Oral q1800   [START ON 01/12/2021] pantoprazole  40 mg Oral Daily   simvastatin  40 mg Oral q1800   Continuous Infusions: PRN Meds:.acetaminophen **OR** acetaminophen, haloperidol lactate, ondansetron **OR** ondansetron (ZOFRAN) IV  Antimicrobials: Anti-infectives (From admission, onward)    None        I have personally reviewed the following labs and images: CBC: Recent Labs  Lab 01/08/21 1550 01/09/21 0418 01/10/21 0518 01/11/21 0508  WBC 7.6 9.1 7.7 6.9  NEUTROABS 5.7  --   --   --   HGB 11.6* 11.7* 11.9* 10.7*  HCT 36.3* 37.1* 37.1* 32.6*  MCV 89.9 89.6 87.5 87.6  PLT 204 218 226 213   BMP &GFR Recent Labs  Lab 01/08/21 1550 01/09/21 0418 01/10/21 0518 01/11/21 0508  NA 132* 130* 130* 127*  128*  K 4.0 4.1 4.1 3.6  3.6  CL 95* 93*  92* 90*  92*  CO2 GLUCOSE 124* 126* 97 99  101*  BUN 24*  25*  CREATININE 0.81 0.91 1.02 0.86  1.01  CALCIUM 9.1 8.8* 8.6* 8.1*  8.2*  MG 1.9  --  1.7 2.0  PHOS  --  4.4 3.9 3.3   Estimated Creatinine Clearance: 47.3 mL/min (by C-G formula based on SCr of 1.01 mg/dL). Liver & Pancreas: Recent Labs  Lab 01/08/21 1550 01/09/21 0418 01/10/21 0518 01/11/21 0508  AST 25 27  --   --   ALT 12 14  --   --   ALKPHOS 60 61  --   --   BILITOT 0.9 1.2  --   --   PROT  6.3* 6.3*  --   --   ALBUMIN 3.5 3.4* 3.4* 2.8*   No results for input(s): LIPASE, AMYLASE in the last 168 hours. No results for input(s): AMMONIA in the last 168 hours. Diabetic: Recent Labs    01/08/21 1847  HGBA1C 5.8*   No results for input(s): GLUCAP in the last 168 hours. Cardiac Enzymes: No results for input(s): CKTOTAL, CKMB, CKMBINDEX, TROPONINI in the last 168 hours. No results for input(s): PROBNP in the last 8760 hours. Coagulation Profile: No results for input(s): INR, PROTIME in the last 168 hours. Thyroid Function Tests: Recent Labs    01/08/21 1847  TSH 3.314  FREET4 1.14*   Lipid Profile: Recent Labs    01/08/21 1847  CHOL 188  HDL 67  LDLCALC 105*  TRIG 80  CHOLHDL 2.8   Anemia Panel: No results for input(s): VITAMINB12, FOLATE, FERRITIN, TIBC, IRON, RETICCTPCT in the last 72 hours. Urine analysis:    Component Value Date/Time   COLORURINE YELLOW (A) 09/10/2017 2157   APPEARANCEUR HAZY (A) 09/10/2017 2157   LABSPEC 1.019 09/10/2017 2157   PHURINE 5.0 09/10/2017 2157   GLUCOSEU NEGATIVE 09/10/2017 2157   HGBUR MODERATE (A) 09/10/2017 2157   BILIRUBINUR NEGATIVE 09/10/2017 2157   KETONESUR 5 (A) 09/10/2017 2157   PROTEINUR 100 (A) 09/10/2017 2157   NITRITE NEGATIVE 09/10/2017 2157   LEUKOCYTESUR NEGATIVE 09/10/2017 2157   Sepsis Labs: Invalid input(s): PROCALCITONIN, LACTICIDVEN  Microbiology: Recent Results (from the past 240 hour(s))  Resp  Panel by RT-PCR (Flu A&B, Covid) Nasopharyngeal Swab     Status: None   Collection Time: 01/08/21  4:03 PM   Specimen: Nasopharyngeal Swab; Nasopharyngeal(NP) swabs in vial transport medium  Result Value Ref Range Status   SARS Coronavirus 2 by RT PCR NEGATIVE NEGATIVE Final    Comment: (NOTE) SARS-CoV-2 target nucleic acids are NOT DETECTED.  The SARS-CoV-2 RNA is generally detectable in upper respiratory specimens during the acute phase of infection. The lowest concentration of SARS-CoV-2 viral copies this assay can detect is 138 copies/mL. A negative result does not preclude SARS-Cov-2 infection and should not be used as the sole basis for treatment or other patient management decisions. A negative result may occur with  improper specimen collection/handling, submission of specimen other than nasopharyngeal swab, presence of viral mutation(s) within the areas targeted by this assay, and inadequate number of viral copies(<138 copies/mL). A negative result must be combined with clinical observations, patient history, and epidemiological information. The expected result is Negative.  Fact Sheet for Patients:  BloggerCourse.com  Fact Sheet for Healthcare Providers:  SeriousBroker.it  This test is no t yet approved or cleared by the [place] FDA and  has been authorized for detection and/or diagnosis of SARS-CoV-2 by FDA under an Emergency Use Authorization (EUA). This EUA will remain  in effect (meaning this test can be used) for the duration of the COVID-19 declaration under Section 564(b)(1) of the Act, 21 U.S.C.section 360bbb-3(b)(1), unless the authorization is terminated  or revoked sooner.       Influenza A by PCR NEGATIVE NEGATIVE Final   Influenza B by PCR NEGATIVE NEGATIVE Final    Comment: (NOTE) The Xpert Xpress SARS-CoV-2/FLU/RSV plus assay is intended as an aid in the diagnosis of influenza from Nasopharyngeal  swab specimens and should not be used as a sole basis for treatment. Nasal washings and aspirates are unacceptable for Xpert Xpress SARS-CoV-2/FLU/RSV testing.  Fact Sheet for Patients: BloggerCourse.com  Fact Sheet for Healthcare Providers: SeriousBroker.it  This test is not yet approved or cleared by the [place] and has been authorized for detection and/or diagnosis of SARS-CoV-2 by FDA under an Emergency Use Authorization (EUA). This EUA will remain in effect (meaning this test can be used) for the duration of the COVID-19 declaration under Section 564(b)(1) of the Act, 21 U.S.C. section 360bbb-3(b)(1), unless the authorization is terminated or revoked.  Performed at [facility], [address], [place], [place] [address]     Radiology Studies: ECHOCARDIOGRAM COMPLETE  Result Date: 01/11/2021    ECHOCARDIOGRAM REPORT   Patient Name:   BERNAL LUHMAN Date of Exam: 01/11/2021 Medical Rec #:  [id number]      Height:       67.0 in Accession #:    [id number]     Weight:       185.0 lb Date of Birth:  Apr 01, 1925     BSA:          1.956 m Patient Age:    89 years       BP:           101/63 mmHg Patient Gender: M              HR:           88 bpm. Exam Location:  [facility] Procedure: 2D Echo Indications:     Dyspnea R06.00  History:         Patient has prior history of Echocardiogram examinations, most                  recent 09/11/2017.  Sonographer:     Overton Mam RDCS Referring Phys:  [id number] Boyce Medici Cielo Arias Diagnosing Phys: Debbe Odea MD IMPRESSIONS  1. Severe concentric hypertrophy. Basal septum upto 2cm, lateral wall upto 1.7cm. consider CMR to evalute for HCM or Amyloid if clinically indicated.. Left ventricular ejection fraction, by estimation, is 35 to 40%. The left ventricle has moderately decreased function. The left ventricle demonstrates global hypokinesis. There is severe concentric left ventricular  hypertrophy of the basal-septal segment. Left ventricular diastolic parameters are indeterminate.  2. Right ventricular systolic function is mildly reduced. The right ventricular size is mildly enlarged.  3. Left atrial size was mildly dilated.  4. Right atrial size was severely dilated.  5. The mitral valve is normal in structure. Mild mitral valve regurgitation.  6. The aortic valve is tricuspid. Aortic valve regurgitation is mild. Mild aortic valve sclerosis is present, with no evidence of aortic valve stenosis.  7. The inferior vena cava is dilated in size with >50% respiratory variability, suggesting right atrial pressure of 8 mmHg. FINDINGS  Left Ventricle: Severe concentric hypertrophy. Basal septum upto 2cm, lateral wall upto 1.7cm. consider CMR to evalute for HCM or Amyloid if clinically indicated. Left ventricular ejection fraction, by estimation, is 35 to 40%. The left ventricle has moderately decreased function. The left ventricle demonstrates global hypokinesis. The left ventricular internal cavity size was normal in size. There is severe concentric left ventricular hypertrophy of the basal-septal segment. Left ventricular diastolic parameters are indeterminate. Right Ventricle: The right ventricular size is mildly enlarged. No increase in right ventricular wall thickness. Right ventricular systolic function is mildly reduced. Left Atrium: Left atrial size was mildly dilated. Right Atrium: Right atrial size was severely dilated. Pericardium: There is no evidence of pericardial effusion. Mitral Valve: The mitral valve is normal in structure. Mild mitral valve regurgitation. Tricuspid Valve: The tricuspid valve is normal in structure. Tricuspid valve  regurgitation is mild. Aortic Valve: The aortic valve is tricuspid. Aortic valve regurgitation is mild. Aortic regurgitation PHT measures 692 msec. Mild aortic valve sclerosis is present, with no evidence of aortic valve stenosis. Aortic valve peak gradient  measures 4.9 mmHg. Pulmonic Valve: The pulmonic valve was not well visualized. Pulmonic valve regurgitation is not visualized. Aorta: The aortic root is normal in size and structure. Venous: The inferior vena cava is dilated in size with greater than 50% respiratory variability, suggesting right atrial pressure of 8 mmHg. IAS/Shunts: No atrial level shunt detected by color flow Doppler.  LEFT VENTRICLE PLAX 2D LVIDd:         4.10 cm     Diastology LVIDs:         3.41 cm     LV e' medial:    4.79 cm/s LV PW:         1.74 cm     LV E/e' medial:  14.2 LV IVS:        1.71 cm     LV e' lateral:   5.66 cm/s LVOT diam:     2.00 cm     LV E/e' lateral: 12.0 LV SV:         26 LV SV Index:   14 LVOT Area:     3.14 cm  LV Volumes (MOD) LV vol d, MOD A2C: 56.8 ml LV vol d, MOD A4C: 74.6 ml LV vol s, MOD A2C: 30.7 ml LV vol s, MOD A4C: 42.9 ml LV SV MOD A2C:     26.1 ml LV SV MOD A4C:     74.6 ml LV SV MOD BP:      32.1 ml RIGHT VENTRICLE RV Basal diam:  4.01 cm RV S prime:     7.18 cm/s TAPSE (M-mode): 1.2 cm LEFT ATRIUM           Index       RIGHT ATRIUM           Index LA diam:      4.30 cm 2.20 cm/m  RA Area:     31.10 cm LA Vol (A2C): 29.0 ml 14.82 ml/m RA Volume:   110.00 ml 56.23 ml/m LA Vol (A4C): 34.5 ml 17.63 ml/m  AORTIC VALVE                PULMONIC VALVE AV Area (Vmax): 1.57 cm    PV Vmax:       0.79 m/s AV Vmax:        111.00 cm/s PV Peak grad:  2.5 mmHg AV Peak Grad:   4.9 mmHg LVOT Vmax:      55.50 cm/s LVOT Vmean:     34.600 cm/s LVOT VTI:       0.084 m AI PHT:         692 msec  AORTA Ao Root diam: 3.30 cm Ao Asc diam:  3.40 cm MITRAL VALVE               TRICUSPID VALVE MV Area (PHT): 3.02 cm    TV Peak grad:   37.3 mmHg MV Decel Time: 251 msec    TV Vmax:        3.05 m/s MV E velocity: 68.10 cm/s                            SHUNTS  Systemic VTI:  0.08 m                            Systemic Diam: 2.00 cm Debbe Odea MD Electronically signed by Debbe Odea MD Signature  Date/Time: 01/11/2021/12:01:48 PM    Final        Yohana Bartha T. [place] [facility]  If 7PM-7AM, please contact night-coverage [email or web address] 01/11/2021, 12:43 PM

## 2021-01-11 NOTE — Progress Notes (Signed)
*  PRELIMINARY RESULTS* Echocardiogram 2D Echocardiogram has been performed.  Kirk Sanchez 01/11/2021, 10:51 AM

## 2021-01-11 NOTE — Plan of Care (Signed)
  Problem: Education: Goal: Knowledge of General Education information will improve Description: Including pain rating scale, medication(s)/side effects and non-pharmacologic comfort measures 01/11/2021 1848 by Ansel Bong, RN Outcome: Progressing 01/11/2021 1848 by Ansel Bong, RN Outcome: Progressing   Problem: Health Behavior/Discharge Planning: Goal: Ability to manage health-related needs will improve 01/11/2021 1848 by Ansel Bong, RN Outcome: Progressing 01/11/2021 1848 by Ansel Bong, RN Outcome: Progressing   Problem: Clinical Measurements: Goal: Ability to maintain clinical measurements within normal limits will improve 01/11/2021 1848 by Ansel Bong, RN Outcome: Progressing 01/11/2021 1848 by Ansel Bong, RN Outcome: Progressing Goal: Will remain free from infection 01/11/2021 1848 by Ansel Bong, RN Outcome: Progressing 01/11/2021 1848 by Ansel Bong, RN Outcome: Progressing Goal: Diagnostic test results will improve 01/11/2021 1848 by Ansel Bong, RN Outcome: Progressing 01/11/2021 1848 by Ansel Bong, RN Outcome: Progressing Goal: Respiratory complications will improve 01/11/2021 1848 by Ansel Bong, RN Outcome: Progressing 01/11/2021 1848 by Ansel Bong, RN Outcome: Progressing Goal: Cardiovascular complication will be avoided 01/11/2021 1848 by Ansel Bong, RN Outcome: Progressing 01/11/2021 1848 by Ansel Bong, RN Outcome: Progressing   Problem: Activity: Goal: Risk for activity intolerance will decrease 01/11/2021 1848 by Ansel Bong, RN Outcome: Progressing 01/11/2021 1848 by Ansel Bong, RN Outcome: Progressing   Problem: Nutrition: Goal: Adequate nutrition will be maintained 01/11/2021 1848 by Ansel Bong, RN Outcome: Progressing 01/11/2021 1848 by Ansel Bong, RN Outcome: Progressing   Problem: Coping: Goal: Level of anxiety will decrease 01/11/2021 1848 by Ansel Bong, RN Outcome:  Progressing 01/11/2021 1848 by Ansel Bong, RN Outcome: Progressing   Problem: Elimination: Goal: Will not experience complications related to bowel motility 01/11/2021 1848 by Ansel Bong, RN Outcome: Progressing 01/11/2021 1848 by Ansel Bong, RN Outcome: Progressing Goal: Will not experience complications related to urinary retention 01/11/2021 1848 by Ansel Bong, RN Outcome: Progressing 01/11/2021 1848 by Ansel Bong, RN Outcome: Progressing   Problem: Pain Managment: Goal: General experience of comfort will improve 01/11/2021 1848 by Ansel Bong, RN Outcome: Progressing 01/11/2021 1848 by Ansel Bong, RN Outcome: Progressing   Problem: Safety: Goal: Ability to remain free from injury will improve 01/11/2021 1848 by Ansel Bong, RN Outcome: Progressing 01/11/2021 1848 by Ansel Bong, RN Outcome: Progressing   Problem: Skin Integrity: Goal: Risk for impaired skin integrity will decrease 01/11/2021 1848 by Ansel Bong, RN Outcome: Progressing 01/11/2021 1848 by Ansel Bong, RN Outcome: Progressing

## 2021-01-12 DIAGNOSIS — I82452 Acute embolism and thrombosis of left peroneal vein: Secondary | ICD-10-CM | POA: Diagnosis not present

## 2021-01-12 DIAGNOSIS — R531 Weakness: Secondary | ICD-10-CM | POA: Diagnosis not present

## 2021-01-12 DIAGNOSIS — H9193 Unspecified hearing loss, bilateral: Secondary | ICD-10-CM | POA: Diagnosis not present

## 2021-01-12 DIAGNOSIS — R0602 Shortness of breath: Secondary | ICD-10-CM | POA: Diagnosis not present

## 2021-01-12 LAB — RENAL FUNCTION PANEL
Albumin: 3 g/dL — ABNORMAL LOW (ref 3.5–5.0)
Anion gap: 11 (ref 5–15)
BUN: 29 mg/dL — ABNORMAL HIGH (ref 8–23)
CO2: 25 mmol/L (ref 22–32)
Calcium: 8.2 mg/dL — ABNORMAL LOW (ref 8.9–10.3)
Chloride: 88 mmol/L — ABNORMAL LOW (ref 98–111)
Creatinine, Ser: 0.81 mg/dL (ref 0.61–1.24)
GFR, Estimated: >60 mL/min (ref >60)
Glucose, Bld: 111 mg/dL — ABNORMAL HIGH (ref 70–99)
Phosphorus: 3.3 mg/dL (ref 2.5–4.6)
Potassium: 4.3 mmol/L (ref 3.5–5.1)
Sodium: 124 mmol/L — ABNORMAL LOW (ref 135–145)

## 2021-01-12 LAB — GLUCOSE, CAPILLARY
Glucose-Capillary: 137 mg/dL — ABNORMAL HIGH (ref 70–99)
Glucose-Capillary: 102 mg/dL — ABNORMAL HIGH (ref 70–99)

## 2021-01-12 LAB — CORTISOL-AM, BLOOD: Cortisol - AM: 30.9 ug/dL — ABNORMAL HIGH (ref 6.7–22.6)

## 2021-01-12 LAB — MAGNESIUM: Magnesium: 2.1 mg/dL (ref 1.7–2.4)

## 2021-01-12 LAB — HEMOGLOBIN AND HEMATOCRIT, BLOOD
HCT: 37 % — ABNORMAL LOW (ref 39.0–52.0)
Hemoglobin: 12 g/dL — ABNORMAL LOW (ref 13.0–17.0)

## 2021-01-12 MED ORDER — FUROSEMIDE 10 MG/ML IJ SOLN
40.0000 mg | Freq: Every day | INTRAMUSCULAR | Status: DC
  Administered 2021-01-12: 40 mg via INTRAVENOUS
  Filled 2021-01-12: qty 4

## 2021-01-12 NOTE — Progress Notes (Signed)
Physical Therapy Treatment Patient Details Name: Kirk Sanchez MRN: [id number] DOB: September 16, 1924 Today's Date: 01/12/2021    History of Present Illness Kirk Sanchez is a 87 y.o. male seen in ed with complaints of  fall 2 weeks ago and hurt his rt ankle and has been progressively dwelling and now left ankle is swollen as well. Pt also reports having midsternal chest discomfort. Pt came via ems from urgent care. EKG shows a.fib rvr and pt has h/o PAF, pt is not on any NOAC.Edema is symmetric 2 + both edema. Pt has past medical history of PAF, Hyperlipidemia, HTN, Kidney stone, CHF, Hypothyroidism hearing impaired. Admitted for shortness of breath (differentials heart failure exacerbation, PE, GERD variant asthma). Positive troponin which will be trended. lives at home. Per family patient is typically very active but has been laying in bed more for past few days.    PT Comments    Pt found standing at bedside with blanket wrapped around feet while reaching for counter.  Pt is very pleasant, however appears to remain at a high risk for falls due to weakness, poor safety awareness, and multiple comorbidities.  Pt requires some help for bed mobility and transfers mostly for safe technique. RW used for support during household gait distance with CGA.  Pt fatigues quickly and declined attempting stairs today.  Pt states he will d/c to his son's home.  If this is the case, he will need initial 24 hour assistance and HHPT, otherwise pt may need SNF placement.  Pt has a significant amount of stairs at home and is unable to continue caring for self independently.   Follow Up Recommendations  Home health PT;Supervision/Assistance - 24 hour     Equipment Recommendations  3in1 (PT);Rolling walker with 5" wheels    Recommendations for Other Services       Precautions / Restrictions Precautions Precautions: Fall Restrictions Weight Bearing Restrictions: No    Mobility  Bed Mobility Overal bed  mobility: Needs Assistance Bed Mobility: Sit to Supine;Supine to Sit     Supine to sit: Min guard Sit to supine: Min guard   General bed mobility comments: slow movements    Transfers Overall transfer level: Needs assistance Equipment used: Rolling walker (2 wheeled) Transfers: Sit to/from Stand Sit to Stand: Min guard         General transfer comment: VCs for RW use  Ambulation/Gait Ambulation/Gait assistance: Min guard Gait Distance (Feet): 75 Feet Assistive device: Rolling walker (2 wheeled) Gait Pattern/deviations: Step-to pattern;Antalgic;Drifts right/left     General Gait Details:  (Pt fatigued quickly requesting return to room, no SOB noted)   Stairs Stairs: Yes       General stair comments:  (Pt declined attempting stairs due to c/o fatigue/weakness)   Wheelchair Mobility    Modified Rankin (Stroke Patients Only)       Balance       Sitting balance - Comments: steady sitting edge of bed       Standing balance comment: Pt completed reaching activities out of BOS with single UE support and Supervision                            Cognition Arousal/Alertness: Awake/alert Behavior During Therapy: WFL for tasks assessed/performed Overall Cognitive Status: Within Functional Limits for tasks assessed  General Comments:  (poor safety awareness)      Exercises      General Comments General comments (skin integrity, edema, etc.):  (Pt found standing in room with blanket wrapped around feet due to poor safety awareness.)      Pertinent Vitals/Pain Pain Assessment: No/denies pain    Home Living                      Prior Function            PT Goals (current goals can now be found in the care plan section) Acute Rehab PT Goals Patient Stated Goal: to go home    Frequency    Min 2X/week      PT Plan      Co-evaluation              AM-PAC PT "6 Clicks"  Mobility   Outcome Measure  Help needed turning from your back to your side while in a flat bed without using bedrails?: A Little Help needed moving from lying on your back to sitting on the side of a flat bed without using bedrails?: A Little Help needed moving to and from a bed to a chair (including a wheelchair)?: A Little Help needed standing up from a chair using your arms (e.g., wheelchair or bedside chair)?: A Little Help needed to walk in hospital room?: A Little Help needed climbing 3-5 steps with a railing? : None 6 Click Score: 19    End of Session Equipment Utilized During Treatment: Gait belt Activity Tolerance: Patient tolerated treatment well;Patient limited by fatigue Patient left: in bed;with nursing/sitter in room;with bed alarm set Nurse Communication: Mobility status PT Visit Diagnosis: Unsteadiness on feet (R26.81);Difficulty in walking, not elsewhere classified (R26.2)     Time: 1230-1300 PT Time Calculation (min) (ACUTE ONLY): 30 min  Charges:  $Gait Training: 8-22 mins $Neuromuscular Re-education: 8-22 mins                    Kirk Sanchez, PTA    Kirk Sanchez 01/12/2021, 1:03 PM

## 2021-01-12 NOTE — Consult Note (Signed)
Cardiology Consultation Note    Patient ID: Kirk Sanchez, MRN: [id number], DOB/AGE: 85-Sep-1926 85 y.o. Admit date: 01/08/2021   Date of Consult: 01/12/2021 [facility] Primary Cardiologist: Dr. Juliann Paresallwood  Chief Complaint: chest pain/fast hr Reason for Consultation: afib/cp Requesting MD: Dr. Alanda SlimGonfa  HPI: Kirk Sanchez is a 87 y.o. male with history of atrial fibrillation, hyperlipidemia, hypertension who was seen in the ER with complaints of a fall 2 weeks prior to presentation with right ankle pain.  Also complained of some chest discomfort.  EKG showed A. fib interventricular conduction delay with RVR.  High-sensitivity troponin was minimally elevated.  Chest x-ray shows stable cardiomegaly with no CHF.  Ankle x-ray showed no acute abnormality.  Lower extremity ultrasound showed DVT in the peroneal veins of the left lower extremity.  No DVT on the right.  He was treated with simvastatin, metoprolol and furosemide.  Was started on apixaban at 5 mg twice daily.  Telemetry currently shows A. fib with controlled ventricular response.  Cardiology consultation requested.  Patient denies chest pain or shortness of breath.  He is currently on Eliquis at 5 mg twice daily for DVT and his A. fib.  Rate is currently being controlled with metoprolol succinate at 25 mg daily.  He is on furosemide 40 mg IV daily.  He is also on simvastatin at 40 mg daily for hyperlipidemia.  He is currently hemodynamically stable.  Echo was read as showing an ejection fraction of 35 to 40% with global hypokinesis.  LVH.  Diastolic dysfunction. Past Medical History:  Diagnosis Date   Atrial fibrillation (HCC)    Hyperlipidemia    Hypertension    Kidney stone    Presbycusis of both ears 03/06/2016   Thyroid disease       Surgical History:  Past Surgical History:  Procedure Laterality Date   HAND ARTHROPLASTY     HIP ARTHROPLASTY     STENT PLACEMENT RT URETER ([facility] HX)       Home  Meds: Prior to Admission medications   Medication Sig Start Date End Date Taking? Authorizing Provider  aspirin 81 MG tablet Take 81 mg by mouth daily.   Yes [provider]  levothyroxine (SYNTHROID) 25 MCG tablet TAKE 1 TABLET BY MOUTH ONCE DAILY ON AN EMPTY STOMACH. WAIT 30 MINUTES BEFORE TAKING OTHER MEDS. Patient taking differently: Take 25 mcg by mouth daily before breakfast. TAKE 1 TABLET BY MOUTH ONCE DAILY ON AN EMPTY STOMACH. WAIT 30 MINUTES BEFORE TAKING OTHER MEDS. 01/18/20  Yes Danelle Berryapia, Leisa, PA-C  simvastatin (ZOCOR) 40 MG tablet TAKE 1 TABLET BY MOUTH AT BEDTIME Patient taking differently: Take 40 mg by mouth at bedtime. TAKE 1 TABLET BY MOUTH AT BEDTIME 04/15/20  Yes [name], [name], MD  Elastic Bandages & Supports (MEDICAL COMPRESSION STOCKINGS) MISC Put on both legs in the morning, take off at night; do NOT sleep in them 11/04/17   Kerman PasseyLada, Melinda P, MD    Inpatient Medications:   apixaban  5 mg Oral BID   furosemide  40 mg Intravenous Daily   levothyroxine  25 mcg Oral Q0600   metoprolol succinate  25 mg Oral q1800   pantoprazole  40 mg Oral Daily   simvastatin  40 mg Oral q1800     Allergies: No Known Allergies  Social History   Socioeconomic History   Marital status: Widowed    Spouse name: Not on file   Number of children: 3  Years of education: Not on file   Highest education level: High school graduate  Occupational History   Occupation: Retired  Tobacco Use   Smoking status: Never   Smokeless tobacco: Never  Vaping Use   Vaping Use: Never used  Substance and Sexual Activity   Alcohol use: No    Alcohol/week: 0.0 standard drinks   Drug use: No   Sexual activity: Not Currently  Other Topics Concern   Not on file  Social History Narrative   Pt lives alone.    Social Determinants of Health   Financial Resource Strain: Low Risk    Difficulty of Paying Living Expenses: Not hard at all  Food Insecurity: No Food Insecurity   Worried  About Programme researcher, broadcasting/film/video in the Last Year: Never true   Ran Out of Food in the Last Year: Never true  Transportation Needs: No Transportation Needs   Lack of Transportation (Medical): No   Lack of Transportation (Non-Medical): No  Physical Activity: Inactive   Days of Exercise per Week: 0 days   Minutes of Exercise per Session: 0 min  Stress: No Stress Concern Present   Feeling of Stress : Not at all  Social Connections: Moderately Isolated   Frequency of Communication with Friends and Family: More than three times a week   Frequency of Social Gatherings with Friends and Family: More than three times a week   Attends Religious Services: More than 4 times per year   Active Member of [employer] or Organizations: No   Attends Banker Meetings: Never   Marital Status: Widowed  Catering manager Violence: Not At Risk   Fear of Current or Ex-Partner: No   Emotionally Abused: No   Physically Abused: No   Sexually Abused: No     Family History  Problem Relation Age of Onset   Cancer Mother        stomach   Asthma Father    Stroke Neg Hx    Heart disease Neg Hx      Review of Systems: A 12-system review of systems was performed and is negative except as noted in the HPI.  Labs: No results for input(s): CKTOTAL, CKMB, TROPONINI in the last 72 hours. Lab Results  Component Value Date   WBC 6.9 01/11/2021   HGB 12.0 (L) 01/12/2021   HCT 37.0 (L) 01/12/2021   MCV 87.6 01/11/2021   PLT 213 01/11/2021    Recent Labs  Lab 01/09/21 0418 01/10/21 0518 01/12/21 0507  NA 130*   < > 124*  K 4.1   < > 4.3  CL 93*   < > 88*  CO2 27   < > 25  BUN 16   < > 29*  CREATININE 0.91   < > 0.81  CALCIUM 8.8*   < > 8.2*  PROT 6.3*  --   --   BILITOT 1.2  --   --   ALKPHOS 61  --   --   ALT 14  --   --   AST 27  --   --   GLUCOSE 126*   < > 111*   < > = values in this interval not displayed.   Lab Results  Component Value Date   CHOL 188 01/08/2021   HDL 67 01/08/2021    LDLCALC 105 (H) 01/08/2021   TRIG 80 01/08/2021   Lab Results  Component Value Date   DDIMER 1.96 (H) 01/09/2021    Radiology/Studies:  DG Ankle  Complete Right  Result Date: 01/09/2021 CLINICAL DATA:  Right ankle pain. EXAM: RIGHT ANKLE - COMPLETE 3+ VIEW COMPARISON:  None. FINDINGS: There is no evidence of fracture, dislocation, or joint effusion. There is no evidence of arthropathy or other focal bone abnormality. Vascular calcifications are noted. IMPRESSION: No acute abnormality seen. Electronically Signed   By: Lupita Raider M.D.   On: 01/09/2021 13:19   US Venous Img Lower Bilateral (DVT)  Result Date: 01/10/2021 CLINICAL DATA:  Elevated D-dimer EXAM: BILATERAL LOWER EXTREMITY VENOUS DOPPLER ULTRASOUND TECHNIQUE: Gray-scale sonography with graded compression, as well as color Doppler and duplex ultrasound were performed to evaluate the lower extremity deep venous systems from the level of the common femoral vein and including the common femoral, femoral, profunda femoral, popliteal and calf veins including the posterior tibial, peroneal and gastrocnemius veins when visible. The superficial great saphenous vein was also interrogated. Spectral Doppler was utilized to evaluate flow at rest and with distal augmentation maneuvers in the common femoral, femoral and popliteal veins. COMPARISON:  None. FINDINGS: RIGHT LOWER EXTREMITY Common Femoral Vein: No evidence of thrombus. Normal compressibility, respiratory phasicity and response to augmentation. Saphenofemoral Junction: No evidence of thrombus. Normal compressibility and flow on color Doppler imaging. Profunda Femoral Vein: No evidence of thrombus. Normal compressibility and flow on color Doppler imaging. Femoral Vein: No evidence of thrombus. Normal compressibility, respiratory phasicity and response to augmentation. Popliteal Vein: No evidence of thrombus. Normal compressibility, respiratory phasicity and response to augmentation. Calf  Veins: No evidence of thrombus. Normal compressibility and flow on color Doppler imaging. Superficial Great Saphenous Vein: No evidence of thrombus. Normal compressibility. Venous Reflux:  None. Other Findings:  None. LEFT LOWER EXTREMITY Common Femoral Vein: No evidence of thrombus. Normal compressibility, respiratory phasicity and response to augmentation. Saphenofemoral Junction: No evidence of thrombus. Normal compressibility and flow on color Doppler imaging. Profunda Femoral Vein: No evidence of thrombus. Normal compressibility and flow on color Doppler imaging. Femoral Vein: No evidence of thrombus. Normal compressibility, respiratory phasicity and response to augmentation. Popliteal Vein: No evidence of thrombus. Normal compressibility, respiratory phasicity and response to augmentation. Calf Veins: Color flow visualized within the posterior tibial veins. However, no definitive cover flow identified within the peroneal veins. The peroneal vein lumen is difficult to visualize but appears noncompressible and distended with internal low-level echoes. Findings concerning for occlusive calf vein DVT. Superficial Great Saphenous Vein: No evidence of thrombus. Normal compressibility. Venous Reflux:  None. Other Findings:  None. IMPRESSION: Positive for isolated DVT involving the peroneal veins in the left lower extremity. No evidence of acute DVT in the right lower extremity, or above the knee in the left lower extremity. These results will be called to the ordering clinician or representative by the Radiologist Assistant, and communication documented in the PACS or [employer]. Electronically Signed   By: Malachy Moan M.D.   On: 01/10/2021 11:22   DG Chest Portable 1 View  Result Date: 01/08/2021 CLINICAL DATA:  Shortness of breath. EXAM: PORTABLE CHEST 1 VIEW COMPARISON:  September 15, 2017. FINDINGS: Stable cardiomegaly. No pneumothorax is noted. Mild bibasilar atelectasis or infiltrates are noted.  No significant pleural effusion is noted. Bony thorax is unremarkable. IMPRESSION: Mild bibasilar subsegmental atelectasis or infiltrates. Aortic Atherosclerosis (ICD10-I70.0). Electronically Signed   By: Lupita Raider M.D.   On: 01/08/2021 16:19   ECHOCARDIOGRAM COMPLETE  Result Date: 01/11/2021    ECHOCARDIOGRAM REPORT   Patient Name:   KASHAWN DIRR Date of Exam: 01/11/2021 Medical  Rec #:  [id number]      Height:       67.0 in Accession #:    [id number]     Weight:       185.0 lb Date of Birth:  [birth year]     BSA:          1.956 m Patient Age:    89 years       BP:           101/63 mmHg Patient Gender: M              HR:           88 bpm. Exam Location:  [facility] Procedure: 2D Echo Indications:     Dyspnea R06.00  History:         Patient has prior history of Echocardiogram examinations, most                  recent 09/11/2017.  Sonographer:     Overton Mam RDCS Referring Phys:  [id number] Boyce Medici GONFA Diagnosing Phys: Debbe Odea MD IMPRESSIONS  1. Severe concentric hypertrophy. Basal septum upto 2cm, lateral wall upto 1.7cm. consider CMR to evalute for HCM or Amyloid if clinically indicated.. Left ventricular ejection fraction, by estimation, is 35 to 40%. The left ventricle has moderately decreased function. The left ventricle demonstrates global hypokinesis. There is severe concentric left ventricular hypertrophy of the basal-septal segment. Left ventricular diastolic parameters are indeterminate.  2. Right ventricular systolic function is mildly reduced. The right ventricular size is mildly enlarged.  3. Left atrial size was mildly dilated.  4. Right atrial size was severely dilated.  5. The mitral valve is normal in structure. Mild mitral valve regurgitation.  6. The aortic valve is tricuspid. Aortic valve regurgitation is mild. Mild aortic valve sclerosis is present, with no evidence of aortic valve stenosis.  7. The inferior vena cava is dilated in size with >50% respiratory variability,  suggesting right atrial pressure of 8 mmHg. FINDINGS  Left Ventricle: Severe concentric hypertrophy. Basal septum upto 2cm, lateral wall upto 1.7cm. consider CMR to evalute for HCM or Amyloid if clinically indicated. Left ventricular ejection fraction, by estimation, is 35 to 40%. The left ventricle has moderately decreased function. The left ventricle demonstrates global hypokinesis. The left ventricular internal cavity size was normal in size. There is severe concentric left ventricular hypertrophy of the basal-septal segment. Left ventricular diastolic parameters are indeterminate. Right Ventricle: The right ventricular size is mildly enlarged. No increase in right ventricular wall thickness. Right ventricular systolic function is mildly reduced. Left Atrium: Left atrial size was mildly dilated. Right Atrium: Right atrial size was severely dilated. Pericardium: There is no evidence of pericardial effusion. Mitral Valve: The mitral valve is normal in structure. Mild mitral valve regurgitation. Tricuspid Valve: The tricuspid valve is normal in structure. Tricuspid valve regurgitation is mild. Aortic Valve: The aortic valve is tricuspid. Aortic valve regurgitation is mild. Aortic regurgitation PHT measures 692 msec. Mild aortic valve sclerosis is present, with no evidence of aortic valve stenosis. Aortic valve peak gradient measures 4.9 mmHg. Pulmonic Valve: The pulmonic valve was not well visualized. Pulmonic valve regurgitation is not visualized. Aorta: The aortic root is normal in size and structure. Venous: The inferior vena cava is dilated in size with greater than 50% respiratory variability, suggesting right atrial pressure of 8 mmHg. IAS/Shunts: No atrial level shunt detected by color flow Doppler.  LEFT VENTRICLE PLAX 2D LVIDd:  4.10 cm     Diastology LVIDs:         3.41 cm     LV e' medial:    4.79 cm/s LV PW:         1.74 cm     LV E/e' medial:  14.2 LV IVS:        1.71 cm     LV e' lateral:   5.66  cm/s LVOT diam:     2.00 cm     LV E/e' lateral: 12.0 LV SV:         26 LV SV Index:   14 LVOT Area:     3.14 cm  LV Volumes (MOD) LV vol d, MOD A2C: 56.8 ml LV vol d, MOD A4C: 74.6 ml LV vol s, MOD A2C: 30.7 ml LV vol s, MOD A4C: 42.9 ml LV SV MOD A2C:     26.1 ml LV SV MOD A4C:     74.6 ml LV SV MOD BP:      32.1 ml RIGHT VENTRICLE RV Basal diam:  4.01 cm RV S prime:     7.18 cm/s TAPSE (M-mode): 1.2 cm LEFT ATRIUM           Index       RIGHT ATRIUM           Index LA diam:      4.30 cm 2.20 cm/m  RA Area:     31.10 cm LA Vol (A2C): 29.0 ml 14.82 ml/m RA Volume:   110.00 ml 56.23 ml/m LA Vol (A4C): 34.5 ml 17.63 ml/m  AORTIC VALVE                PULMONIC VALVE AV Area (Vmax): 1.57 cm    PV Vmax:       0.79 m/s AV Vmax:        111.00 cm/s PV Peak grad:  2.5 mmHg AV Peak Grad:   4.9 mmHg LVOT Vmax:      55.50 cm/s LVOT Vmean:     34.600 cm/s LVOT VTI:       0.084 m AI PHT:         692 msec  AORTA Ao Root diam: 3.30 cm Ao Asc diam:  3.40 cm MITRAL VALVE               TRICUSPID VALVE MV Area (PHT): 3.02 cm    TV Peak grad:   37.3 mmHg MV Decel Time: 251 msec    TV Vmax:        3.05 m/s MV E velocity: 68.10 cm/s                            SHUNTS                            Systemic VTI:  0.08 m                            Systemic Diam: 2.00 cm Debbe Odea MD Electronically signed by Debbe Odea MD Signature Date/Time: 01/11/2021/12:01:48 PM    Final     Wt Readings from Last 3 Encounters:  01/12/21 84 kg  01/08/21 81.6 kg  12/05/20 81.6 kg    EKG: Atrial fibrillation with interventricular conduction delay  Physical Exam:  Blood pressure 118/79, pulse 67, temperature 97.6 F (36.4 C), temperature source  Oral, resp. rate 18, height  (1.702 m), weight 84 kg, SpO2 92 %. Body mass index is 29 kg/m. General: Well developed, well nourished, in no acute distress. Head: Normocephalic, atraumatic, sclera non-icteric, no xanthomas, nares are without discharge.  Neck: Negative for carotid  bruits. JVD not elevated. Lungs: Clear bilaterally to auscultation without wheezes, rales, or rhonchi. Breathing is unlabored. Heart: Irregular irregular Abdomen: Soft, non-tender, non-distended with normoactive bowel sounds. No hepatomegaly. No rebound/guarding. No obvious abdominal masses. Msk:  Strength and tone appear normal for age. Extremities: No clubbing or cyanosis. No edema.  Distal pedal pulses are 2+ and equal bilaterally. Neuro: Alert and oriented X 3. No facial asymmetry. No focal deficit. Moves all extremities spontaneously.  Somewhat difficult historian.      Assessment and Plan  87 year old male with history of paroxysmal atrial fibrillation, hypertension and hyperlipidemia who was admitted to [facility] after presenting to the ER with complaints of right ankle pain and swelling after a fall several weeks earlier.  He was noted to have a DVT.  He was placed on Eliquis at 5 mg twice daily.  He was also noted to have atrial fibrillation with rapid ventricular response.  Rate is currently controlled.  He is on metoprolol.  Echo was read as showing mildly reduced LV function. Echo done in 2019 showed ef of 40-45%.  There is severe LVH.  1.  Continue control A. fib with metoprolol succinate at current dose following rate.  Is on Eliquis at afib dose at 5 mg bid. He is 89, but weight is 84 kg and his creatinine is 1.09. Would continue  2.  DVT-currently on Eliquis at 5 mg twice daily.  DVT dose is 10 mg bid for 7 days however, given advanced age and relative fraility, will continue with 5 mg bid for now.   3. Hypertension-appears controlled at present.   4 Hyperlipidemia-continue with statin for now.     Signed, Dalia Heading MD 01/12/2021, 12:43 PM Pager: [phone]

## 2021-01-12 NOTE — Care Management Important Message (Signed)
Important Message  Patient Details  Name: Kirk Sanchez MRN: [id number] Date of Birth: Dec 14, 1924   Medicare Important Message Given:  Yes     [name] 01/12/2021, 12:21 PM

## 2021-01-12 NOTE — TOC Initial Note (Signed)
Transition of Care [facility]) - Initial/Assessment Note    Patient Details  Name: Kirk Sanchez MRN: [id number] Date of Birth: [birth year]  Transition of Care [facility]) CM/SW Contact:    Gildardo Griffes, LCSW Phone Number: 01/12/2021, 2:56 PM  Clinical Narrative:                  CSW spoke with patient's son [name] who confirms that patient will discharge home with him when medically stable. [name] reports he is retired and can provide 24/7 supervision while patient stays with him.   Reynaldo Minium address is [address] [place], [place] [address] where he is agreeable for home health services to be set up for patient without preference of agency.   DME needs include rolling walker and 3in1 at time of discharge, will be delivered to room by [facility] with [employer].   CSW to send out home health referrals for PT, OT and RN for acceptance.   Expected Discharge Plan: Home w Home Health Services Barriers to Discharge: Continued Medical Work up   Patient Goals and CMS Choice Patient states their goals for this hospitalization and ongoing recovery are:: to go home CMS Medicare.gov Compare Post Acute Care list provided to:: Patient Represenative (must comment) (son [name]) Choice offered to / list presented to : Patient, Adult Children  Expected Discharge Plan and Services Expected Discharge Plan: Home w Home Health Services       Living arrangements for the past 2 months: Single Family Home                 DME Arranged: Walker rolling, 3-N-1 DME Agency: [employer] Date DME Agency Contacted: 01/12/21 Time DME Agency Contacted: 102 Representative spoke with at DME Agency: Bjorn Loser HH Arranged: PT, OT, RN HH Agency: [facility]        Prior Living Arrangements/Services Living arrangements for the past 2 months: Single Family Home Lives with:: Self Patient language and need for interpreter reviewed:: Yes Do you feel safe going back to the place where you live?: Yes      Need  for Family Participation in Patient Care: Yes (Comment) Care giver support system in place?: Yes (comment)   Criminal Activity/Legal Involvement Pertinent to Current Situation/Hospitalization: No - Comment as needed  Activities of Daily Living Home Assistive Devices/Equipment: Cane (specify quad or straight), Hearing aid, Walker (specify type) ADL Screening (condition at time of admission) Patient's cognitive ability adequate to safely complete daily activities?: Yes Is the patient deaf or have difficulty hearing?: Yes Does the patient have difficulty seeing, even when wearing glasses/contacts?: No Does the patient have difficulty concentrating, remembering, or making decisions?: No Patient able to express need for assistance with ADLs?: Yes Does the patient have difficulty dressing or bathing?: No Independently performs ADLs?: Yes (appropriate for developmental age) Does the patient have difficulty walking or climbing stairs?: Yes Weakness of Legs: Both Weakness of Arms/Hands: None  Permission Sought/Granted      Share Information with NAME: [name]  Permission granted to share info w AGENCY: home health  Permission granted to share info w Relationship: son  Permission granted to share info w Contact Information: [phone]  Emotional Assessment Appearance:: Appears stated age     Orientation: : Oriented to Self, Oriented to Place, Oriented to  Time, Oriented to Situation Alcohol / Substance Use: Not Applicable Psych Involvement: No (comment)  Admission diagnosis:  SOB (shortness of breath) [R06.02] Right ankle pain [M25.571] Acute on chronic systolic CHF (congestive heart failure) (HCC) [  I50.23] Acute on chronic heart failure, unspecified heart failure type (HCC) [I50.9] Atrial fibrillation, unspecified type [facility]) [I48.91] Patient Active Problem List   Diagnosis Date Noted   Acute on chronic systolic CHF (congestive heart failure) (HCC) 01/09/2021   Shortness of breath  01/08/2021   SOB (shortness of breath) 01/08/2021   CHF (congestive heart failure), NYHA class II, chronic, diastolic (HCC) 08/09/2019   Ischemic cardiomyopathy 08/09/2019   Senile purpura (HCC) 02/06/2019   DNR (do not resuscitate)    Palliative care by specialist    Hyponatremia 09/10/2017   Right shoulder tendonitis 03/08/2017   Atrial fibrillation (HCC) 03/06/2016   Presbycusis of both ears 03/06/2016   Anemia 03/03/2016   Arteriosclerosis of coronary artery 01/28/2015   Cardiac murmur 01/28/2015   HLD (hyperlipidemia) 01/28/2015   BP (high blood pressure) 01/28/2015   Hypothyroidism 01/28/2015   PCP:  [facility] Pharmacy:   [facility] - [place], [place] - [address] [place] [address] Phone: [phone] Fax: [phone]     Social Determinants of Health (SDOH) Interventions    Readmission Risk Interventions No flowsheet data found.

## 2021-01-12 NOTE — Progress Notes (Signed)
PROGRESS NOTE  Kirk HeadsHoward H Phenix JXB:[id number] DOB: 26-Apr-1925   [facility]  Patient is from: Home. Lives alone  DOA: 01/08/2021 LOS: 4  Chief complaints: Leg swelling and shortness of breath  Brief Narrative / Interim history: 87 year old M with PMH of A. fib, systolic CHF, HTN, HLD, hypothyroidism, diminished hearing, and anemia presenting with bilateral lower extremity swelling/edema, midsternal chest discomfort, shortness of breath, generalized weakness, poor PO intake and admitted for A. fib with RVR and possible CHF exacerbation.  He was started on IV Lasix.  RVR was short-lived, and he was started on metoprolol XL.  His troponin was mildly elevated.  BNP 220 but no prior to compare to.  CXR with mild bibasilar opacities.  D-dimer mildly elevated.  LE venous Doppler with isolated DVT involving the left peroneal vein.  He has been started on Eliquis for A. fib.  TTE with LVEF of 35 to 40%, severe concentric LVH, global hypokinesis, severe RAE and mildly reduced RV EF.  Cardiology consulted.  Subjective: Seen and examined earlier this morning.  No major events overnight of this morning.  No complaints.  He denies chest pain, shortness of breath, GI or UTI symptoms.  Objective: Vitals:   01/11/21 1535 01/12/21 0321 01/12/21 0500 01/12/21 0839  BP: (!) 130/97 (!) 109/96  131/87  Pulse: 88 72  73  Resp: 18 18  18   Temp: 97.9 F (36.6 C) 98.1 F (36.7 C)  97.8 F (36.6 C)  TempSrc:    Oral  SpO2: 91% 93%  95%  Weight:   84 kg   Height:        Intake/Output Summary (Last 24 hours) at 01/12/2021 1203 Last data filed at 01/12/2021 0940 Gross per 24 hour  Intake 480 ml  Output --  Net 480 ml    Filed Weights   01/10/21 0427 01/11/21 0630 01/12/21 0500  Weight: 85.5 kg 83.9 kg 84 kg    Examination:  GENERAL: No apparent distress.  Nontoxic. HEENT: MMM.  Vision grossly intact.  Diminished hearing. NECK: Supple.  No apparent JVD.  RESP:  No IWOB.  Fair  aeration bilaterally. CVS:  RRR. Heart sounds normal.  ABD/GI/GU: BS+. Abd soft, NTND.  MSK/EXT:  Moves extremities. No apparent deformity.  1+ pitting edema bilaterally. SKIN: no apparent skin lesion or wound NEURO: Awake and alert. Oriented appropriately.  No apparent focal neuro deficit. PSYCH: Calm. Normal affect.   Procedures:  None  Microbiology summarized: COVID-19 PCR nonreactive.  Assessment & Plan: Acute on chronic systolic CHF: patient presents with dyspnea, chest discomfort and lower extremity edema.  TTE with LVEF of 35 to 40% (previously 40-45%), severe concentric LVH, severe RAE and mildly reduced RV SF.  BNP 220 but no prior to compare to.  Chest x-ray with bibasilar opacities.  Started on IV Lasix.  I&O incomplete as patient refuses to void in urinal.  Renal function stable.  Still has 1+ pitting edema bilaterally. -Increase IV Lasix to 40 mg daily -Cardiology consulted about TTE finding and will see patient. -Monitor fluid and respiratory status, and renal function.  Paroxysmal A. fib with RVR: Heart RVR to 130s but converted to sinus rhythm quickly.  TSH within normal.  HR in 60s to 70s now.  Not on medication at home. CHAD-2-Vasc score >4.  -Continue Toprol-XL to 25 mg daily -No Hx of fall but leaves alone.  Started Eliquis after discussion with son, Iantha FallenKenneth.   Hyponatremia: Likely hypervolemic.  Na 128>>>124. Urine sodium will  be unreliable while on IV Lasix.  TSH within normal.  No hyperkalemia to suggest hypoaldosteronism but he is on lasix.  -Increase IV Lasix to 40 mg daily -Recheck sodium in the morning  Left peroneal vein DVT -Continue Eliquis. -Doubt need for CTA as he is already on Eliquis.  Chest discomfort/elevated troponin: likely demand ischemia from the above.  Resolved. -Continue metoprolol XL and statin  Hypothyroidism: TSH is normal -Continue home synthroid  Normocytic anemia: H&H stable Recent Labs    12/05/20 1411 01/08/21 1550  01/09/21 0418 01/10/21 0518 01/11/21 0508 01/12/21 0644  HGB 11.6* 11.6* 11.7* 11.9* 10.7* 12.0*   Diminished hearing  Generalized weakness -PT/OT eval-recommended SNF.  Patient is in agreement but prefers close to [employer] of care counseling: advanced age with the above comorbidity. Still full code which I believe could cause more harm than benefit.  I recommended DNR/DNI but patient's son prefers to continue full code.   Body mass index is 29 kg/m.         DVT prophylaxis:  SCDs Start: 01/08/21 1827 apixaban (ELIQUIS) tablet 5 mg  Code Status: Full code-confirmed Family Communication: Patient and RN.  Updated patient's son over the phone. Level of care: Med-Surg Status is: Inpatient  Remains inpatient appropriate because:IV treatments appropriate due to intensity of illness or inability to take PO and Inpatient level of care appropriate due to severity of illness  Dispo: The patient is from: Home              Anticipated d/c is to:  SNF              Patient currently is not medically stable to d/c.   Difficult to place patient No       Consultants:  Cardiology  Sch Meds:  Scheduled Meds:  apixaban  5 mg Oral BID   furosemide  40 mg Intravenous Daily   levothyroxine  25 mcg Oral Q0600   metoprolol succinate  25 mg Oral q1800   pantoprazole  40 mg Oral Daily   simvastatin  40 mg Oral q1800   Continuous Infusions: PRN Meds:.acetaminophen **OR** acetaminophen, haloperidol lactate, ondansetron **OR** ondansetron (ZOFRAN) IV  Antimicrobials: Anti-infectives (From admission, onward)    None        I have personally reviewed the following labs and images: CBC: Recent Labs  Lab 01/08/21 1550 01/09/21 0418 01/10/21 0518 01/11/21 0508 01/12/21 0644  WBC 7.6 9.1 7.7 6.9  --   NEUTROABS 5.7  --   --   --   --   HGB 11.6* 11.7* 11.9* 10.7* 12.0*  HCT 36.3* 37.1* 37.1* 32.6* 37.0*  MCV 89.9 89.6 87.5 87.6  --   PLT 204 218 226 213  --    BMP  &GFR Recent Labs  Lab 01/08/21 1550 01/09/21 0418 01/10/21 0518 01/11/21 0508 01/12/21 0507  NA 132* 130* 130* 127*  128* 124*  K 4.0 4.1 4.1 3.6  3.6 4.3  CL 95* 93* 92* 90*  92* 88*  CO2 27 27 30 29  28 25   GLUCOSE 124* 126* 97 99  101* 111*  BUN 17 16 19  24*  25* 29*  CREATININE 0.81 0.91 1.02 0.86  1.01 0.81  CALCIUM 9.1 8.8* 8.6* 8.1*  8.2* 8.2*  MG 1.9  --  1.7 2.0 2.1  PHOS  --  4.4 3.9 3.3 3.3   Estimated Creatinine Clearance: 59.1 mL/min (by C-G formula based on SCr of 0.81 mg/dL). Liver & Pancreas: Recent  Labs  Lab 01/08/21 1550 01/09/21 0418 01/10/21 0518 01/11/21 0508 01/12/21 0507  AST 25 27  --   --   --   ALT 12 14  --   --   --   ALKPHOS 60 61  --   --   --   BILITOT 0.9 1.2  --   --   --   PROT 6.3* 6.3*  --   --   --   ALBUMIN 3.5 3.4* 3.4* 2.8* 3.0*   No results for input(s): LIPASE, AMYLASE in the last 168 hours. No results for input(s): AMMONIA in the last 168 hours. Diabetic: No results for input(s): HGBA1C in the last 72 hours.  No results for input(s): GLUCAP in the last 168 hours. Cardiac Enzymes: No results for input(s): CKTOTAL, CKMB, CKMBINDEX, TROPONINI in the last 168 hours. No results for input(s): PROBNP in the last 8760 hours. Coagulation Profile: No results for input(s): INR, PROTIME in the last 168 hours. Thyroid Function Tests: No results for input(s): TSH, T4TOTAL, FREET4, T3FREE, THYROIDAB in the last 72 hours.  Lipid Profile: No results for input(s): CHOL, HDL, LDLCALC, TRIG, CHOLHDL, LDLDIRECT in the last 72 hours.  Anemia Panel: No results for input(s): VITAMINB12, FOLATE, FERRITIN, TIBC, IRON, RETICCTPCT in the last 72 hours. Urine analysis:    Component Value Date/Time   COLORURINE YELLOW (A) 09/10/2017 2157   APPEARANCEUR HAZY (A) 09/10/2017 2157   LABSPEC 1.019 09/10/2017 2157   PHURINE 5.0 09/10/2017 2157   GLUCOSEU NEGATIVE 09/10/2017 2157   HGBUR MODERATE (A) 09/10/2017 2157   BILIRUBINUR NEGATIVE  09/10/2017 2157   KETONESUR 5 (A) 09/10/2017 2157   PROTEINUR 100 (A) 09/10/2017 2157   NITRITE NEGATIVE 09/10/2017 2157   LEUKOCYTESUR NEGATIVE 09/10/2017 2157   Sepsis Labs: Invalid input(s): PROCALCITONIN, LACTICIDVEN  Microbiology: Recent Results (from the past 240 hour(s))  Resp Panel by RT-PCR (Flu A&B, Covid) Nasopharyngeal Swab     Status: None   Collection Time: 01/08/21  4:03 PM   Specimen: Nasopharyngeal Swab; Nasopharyngeal(NP) swabs in vial transport medium  Result Value Ref Range Status   SARS Coronavirus 2 by RT PCR NEGATIVE NEGATIVE Final    Comment: (NOTE) SARS-CoV-2 target nucleic acids are NOT DETECTED.  The SARS-CoV-2 RNA is generally detectable in upper respiratory specimens during the acute phase of infection. The lowest concentration of SARS-CoV-2 viral copies this assay can detect is 138 copies/mL. A negative result does not preclude SARS-Cov-2 infection and should not be used as the sole basis for treatment or other patient management decisions. A negative result may occur with  improper specimen collection/handling, submission of specimen other than nasopharyngeal swab, presence of viral mutation(s) within the areas targeted by this assay, and inadequate number of viral copies(<138 copies/mL). A negative result must be combined with clinical observations, patient history, and epidemiological information. The expected result is Negative.  Fact Sheet for Patients:  BloggerCourse.com  Fact Sheet for Healthcare Providers:  SeriousBroker.it  This test is no t yet approved or cleared by the [place] FDA and  has been authorized for detection and/or diagnosis of SARS-CoV-2 by FDA under an Emergency Use Authorization (EUA). This EUA will remain  in effect (meaning this test can be used) for the duration of the COVID-19 declaration under Section 564(b)(1) of the Act, 21 U.S.C.section 360bbb-3(b)(1),  unless the authorization is terminated  or revoked sooner.       Influenza A by PCR NEGATIVE NEGATIVE Final   Influenza B by PCR NEGATIVE NEGATIVE  Final    Comment: (NOTE) The Xpert Xpress SARS-CoV-2/FLU/RSV plus assay is intended as an aid in the diagnosis of influenza from Nasopharyngeal swab specimens and should not be used as a sole basis for treatment. Nasal washings and aspirates are unacceptable for Xpert Xpress SARS-CoV-2/FLU/RSV testing.  Fact Sheet for Patients: BloggerCourse.com  Fact Sheet for Healthcare Providers: SeriousBroker.it  This test is not yet approved or cleared by the [place] FDA and has been authorized for detection and/or diagnosis of SARS-CoV-2 by FDA under an Emergency Use Authorization (EUA). This EUA will remain in effect (meaning this test can be used) for the duration of the COVID-19 declaration under Section 564(b)(1) of the Act, 21 U.S.C. section 360bbb-3(b)(1), unless the authorization is terminated or revoked.  Performed at [facility], [address], [place], [place] [address]     Radiology Studies: No results found.     Dshawn Mcnay T. [place] [facility]  If 7PM-7AM, please contact night-coverage [email or web address] 01/12/2021, 12:03 PM

## 2021-01-13 DIAGNOSIS — H9193 Unspecified hearing loss, bilateral: Secondary | ICD-10-CM | POA: Diagnosis not present

## 2021-01-13 DIAGNOSIS — R0602 Shortness of breath: Secondary | ICD-10-CM | POA: Diagnosis not present

## 2021-01-13 DIAGNOSIS — I82452 Acute embolism and thrombosis of left peroneal vein: Secondary | ICD-10-CM | POA: Diagnosis not present

## 2021-01-13 DIAGNOSIS — E871 Hypo-osmolality and hyponatremia: Secondary | ICD-10-CM | POA: Diagnosis not present

## 2021-01-13 LAB — RENAL FUNCTION PANEL
Albumin: 2.7 g/dL — ABNORMAL LOW (ref 3.5–5.0)
Anion gap: 11 (ref 5–15)
BUN: 24 mg/dL — ABNORMAL HIGH (ref 8–23)
CO2: 28 mmol/L (ref 22–32)
Calcium: 7.8 mg/dL — ABNORMAL LOW (ref 8.9–10.3)
Chloride: 83 mmol/L — ABNORMAL LOW (ref 98–111)
Creatinine, Ser: 0.79 mg/dL (ref 0.61–1.24)
GFR, Estimated: >60 mL/min (ref >60)
Glucose, Bld: 92 mg/dL (ref 70–99)
Phosphorus: 2.3 mg/dL — ABNORMAL LOW (ref 2.5–4.6)
Potassium: 3.4 mmol/L — ABNORMAL LOW (ref 3.5–5.1)
Sodium: 122 mmol/L — ABNORMAL LOW (ref 135–145)

## 2021-01-13 LAB — MAGNESIUM: Magnesium: 1.8 mg/dL (ref 1.7–2.4)

## 2021-01-13 MED ORDER — MAGNESIUM SULFATE 2 GM/50ML IV SOLN
2.0000 g | Freq: Once | INTRAVENOUS | Status: AC
  Administered 2021-01-13: 2 g via INTRAVENOUS
  Filled 2021-01-13: qty 50

## 2021-01-13 MED ORDER — K PHOS MONO-SOD PHOS DI & MONO 155-852-130 MG PO TABS
500.0000 mg | ORAL_TABLET | Freq: Once | ORAL | Status: AC
  Administered 2021-01-13: 500 mg via ORAL
  Filled 2021-01-13: qty 2

## 2021-01-13 MED ORDER — POTASSIUM CHLORIDE CRYS ER 20 MEQ PO TBCR
40.0000 meq | EXTENDED_RELEASE_TABLET | ORAL | Status: AC
  Administered 2021-01-13 (×2): 40 meq via ORAL
  Filled 2021-01-13 (×2): qty 2

## 2021-01-13 MED ORDER — FUROSEMIDE 10 MG/ML IJ SOLN
40.0000 mg | Freq: Two times a day (BID) | INTRAMUSCULAR | Status: DC
  Administered 2021-01-13 (×2): 40 mg via INTRAVENOUS
  Filled 2021-01-13 (×2): qty 4

## 2021-01-13 NOTE — Progress Notes (Signed)
OT Cancellation Note  Patient Details Name: Kirk Sanchez MRN: [id number] DOB: 01-May-1925   Cancelled Treatment:    Reason Eval/Treat Not Completed: Fatigue/lethargy limiting ability to participate. Pt declined, stating that he had already been up today, had just gotten back to bed, and was too tired to get up again this afternoon. Will attempt OT at a later date, as time allows and pt is agreeable to session.  Latina Craver, PhD, MS, OTR/L 01/13/21, 3:23 PM

## 2021-01-13 NOTE — Progress Notes (Signed)
Patient Name: Kirk Sanchez Date of Encounter: 01/13/2021  Hospital Problem List     Principal Problem:   Shortness of breath Active Problems:   HLD (hyperlipidemia)   Hypothyroidism   Anemia   Atrial fibrillation (HCC)   CHF (congestive heart failure), NYHA class II, chronic, diastolic (HCC)   SOB (shortness of breath)   Acute on chronic systolic CHF (congestive heart failure) [facility](HCC)    Patient Profile     87 y.o. male with history of atrial fibrillation, hyperlipidemia, hypertension who was seen in the ER with complaints of a fall 2 weeks prior to presentation with right ankle pain.  Also complained of some chest discomfort.  EKG showed A. fib interventricular conduction delay with RVR.  High-sensitivity troponin was minimally elevated.  Chest x-ray shows stable cardiomegaly with no CHF.  Ankle x-ray showed no acute abnormality.  Lower extremity ultrasound showed DVT in the peroneal veins of the left lower extremity.  No DVT on the right.  He was treated with simvastatin, metoprolol and furosemide.  Was started on apixaban at 5 mg twice daily.  Telemetry currently shows A. fib with controlled ventricular response.  Cardiology consultation requested.  Patient denies chest pain or shortness of breath.  He is currently on Eliquis at 5 mg twice daily for DVT and his A. fib.  Rate is currently being controlled with metoprolol succinate at 25 mg daily.  He is on furosemide 40 mg IV daily.  He is also on simvastatin at 40 mg daily for hyperlipidemia.  He is currently hemodynamically stable.  Echo was read as showing an ejection fraction of 35 to 40% with global hypokinesis.  LVH.  Diastolic dysfunction.  Subjective   Less short of breath.  Inpatient Medications     apixaban  5 mg Oral BID   furosemide  40 mg Intravenous BID   levothyroxine  25 mcg Oral Q0600   metoprolol succinate  25 mg Oral q1800   pantoprazole  40 mg Oral Daily   potassium chloride  40 mEq Oral Q4H   simvastatin  40  mg Oral q1800    Vital Signs    Vitals:   01/12/21 2002 01/13/21 0325 01/13/21 0425 01/13/21 0728  BP: 125/69 110/77 (!) 147/98 140/87  Pulse: 62 72 72 65  Resp: 18 18 18 18   Temp: 98.1 F (36.7 C) 97.8 F (36.6 C) 98.4 F (36.9 C) 97.6 F (36.4 C)  TempSrc:    Oral  SpO2: 94% 92% 92% 94%  Weight:   83.9 kg   Height:        Intake/Output Summary (Last 24 hours) at 01/13/2021 0820 Last data filed at 01/12/2021 1330 Gross per 24 hour  Intake 720 ml  Output --  Net 720 ml   Filed Weights   01/11/21 0630 01/12/21 0500 01/13/21 0425  Weight: 83.9 kg 84 kg 83.9 kg    Physical Exam      HEENT: normal.  Neck: Supple, no JVD, carotid bruits, or masses. Cardiac: Irregularly irregular Respiratory:  Respirations regular and unlabored, clear to auscultation bilaterally. GI: Soft, nontender, nondistended, BS + x 4. MS: no deformity or atrophy. Skin: warm and dry, no rash. Neuro:  Strength and sensation are intact. Psych: Normal affect.  Labs    CBC Recent Labs    01/11/21 0508 01/12/21 0644  WBC 6.9  --   HGB 10.7* 12.0*  HCT 32.6* 37.0*  MCV 87.6  --   PLT 213  --  Basic Metabolic Panel Recent Labs    91/63/84 0507 01/13/21 0605  NA 124* 122*  K 4.3 3.4*  CL 88* 83*  CO2 25 28  GLUCOSE 111* 92  BUN 29* 24*  CREATININE 0.81 0.79  CALCIUM 8.2* 7.8*  MG 2.1 1.8  PHOS 3.3 2.3*   Liver Function Tests Recent Labs    01/12/21 0507 01/13/21 0605  ALBUMIN 3.0* 2.7*   No results for input(s): LIPASE, AMYLASE in the last 72 hours. Cardiac Enzymes No results for input(s): CKTOTAL, CKMB, CKMBINDEX, TROPONINI in the last 72 hours. BNP No results for input(s): BNP in the last 72 hours. D-Dimer No results for input(s): DDIMER in the last 72 hours. Hemoglobin A1C No results for input(s): HGBA1C in the last 72 hours. Fasting Lipid Panel No results for input(s): CHOL, HDL, LDLCALC, TRIG, CHOLHDL, LDLDIRECT in the last 72 hours. Thyroid Function Tests No  results for input(s): TSH, T4TOTAL, T3FREE, THYROIDAB in the last 72 hours.  Invalid input(s): FREET3  Telemetry    Atrial fibrillation with controlled ventricular response  ECG    A. fib with bundle branch block.  Radiology    DG Ankle Complete Right  Result Date: 01/09/2021 CLINICAL DATA:  Right ankle pain. EXAM: RIGHT ANKLE - COMPLETE 3+ VIEW COMPARISON:  None. FINDINGS: There is no evidence of fracture, dislocation, or joint effusion. There is no evidence of arthropathy or other focal bone abnormality. Vascular calcifications are noted. IMPRESSION: No acute abnormality seen. Electronically Signed   By: Lupita Raider M.D.   On: 01/09/2021 13:19   US Venous Img Lower Bilateral (DVT)  Result Date: 01/10/2021 CLINICAL DATA:  Elevated D-dimer EXAM: BILATERAL LOWER EXTREMITY VENOUS DOPPLER ULTRASOUND TECHNIQUE: Gray-scale sonography with graded compression, as well as color Doppler and duplex ultrasound were performed to evaluate the lower extremity deep venous systems from the level of the common femoral vein and including the common femoral, femoral, profunda femoral, popliteal and calf veins including the posterior tibial, peroneal and gastrocnemius veins when visible. The superficial great saphenous vein was also interrogated. Spectral Doppler was utilized to evaluate flow at rest and with distal augmentation maneuvers in the common femoral, femoral and popliteal veins. COMPARISON:  None. FINDINGS: RIGHT LOWER EXTREMITY Common Femoral Vein: No evidence of thrombus. Normal compressibility, respiratory phasicity and response to augmentation. Saphenofemoral Junction: No evidence of thrombus. Normal compressibility and flow on color Doppler imaging. Profunda Femoral Vein: No evidence of thrombus. Normal compressibility and flow on color Doppler imaging. Femoral Vein: No evidence of thrombus. Normal compressibility, respiratory phasicity and response to augmentation. Popliteal Vein: No evidence of  thrombus. Normal compressibility, respiratory phasicity and response to augmentation. Calf Veins: No evidence of thrombus. Normal compressibility and flow on color Doppler imaging. Superficial Great Saphenous Vein: No evidence of thrombus. Normal compressibility. Venous Reflux:  None. Other Findings:  None. LEFT LOWER EXTREMITY Common Femoral Vein: No evidence of thrombus. Normal compressibility, respiratory phasicity and response to augmentation. Saphenofemoral Junction: No evidence of thrombus. Normal compressibility and flow on color Doppler imaging. Profunda Femoral Vein: No evidence of thrombus. Normal compressibility and flow on color Doppler imaging. Femoral Vein: No evidence of thrombus. Normal compressibility, respiratory phasicity and response to augmentation. Popliteal Vein: No evidence of thrombus. Normal compressibility, respiratory phasicity and response to augmentation. Calf Veins: Color flow visualized within the posterior tibial veins. However, no definitive cover flow identified within the peroneal veins. The peroneal vein lumen is difficult to visualize but appears noncompressible and distended with internal low-level echoes.  Findings concerning for occlusive calf vein DVT. Superficial Great Saphenous Vein: No evidence of thrombus. Normal compressibility. Venous Reflux:  None. Other Findings:  None. IMPRESSION: Positive for isolated DVT involving the peroneal veins in the left lower extremity. No evidence of acute DVT in the right lower extremity, or above the knee in the left lower extremity. These results will be called to the ordering clinician or representative by the Radiologist Assistant, and communication documented in the PACS or [employer]. Electronically Signed   By: Malachy Moan M.D.   On: 01/10/2021 11:22   DG Chest Portable 1 View  Result Date: 01/08/2021 CLINICAL DATA:  Shortness of breath. EXAM: PORTABLE CHEST 1 VIEW COMPARISON:  September 15, 2017. FINDINGS: Stable  cardiomegaly. No pneumothorax is noted. Mild bibasilar atelectasis or infiltrates are noted. No significant pleural effusion is noted. Bony thorax is unremarkable. IMPRESSION: Mild bibasilar subsegmental atelectasis or infiltrates. Aortic Atherosclerosis (ICD10-I70.0). Electronically Signed   By: Lupita Raider M.D.   On: 01/08/2021 16:19   ECHOCARDIOGRAM COMPLETE  Result Date: 01/11/2021    ECHOCARDIOGRAM REPORT   Patient Name:   CASHIUS GRANDSTAFF Date of Exam: 01/11/2021 Medical Rec #:  [id number]      Height:       67.0 in Accession #:    [id number]     Weight:       185.0 lb Date of Birth:  1924-10-19     BSA:          1.956 m Patient Age:    87 years       BP:           101/63 mmHg Patient Gender: M              HR:           88 bpm. Exam Location:  [facility] Procedure: 2D Echo Indications:     Dyspnea R06.00  History:         Patient has prior history of Echocardiogram examinations, most                  recent 09/11/2017.  Sonographer:     Overton Mam RDCS Referring Phys:  [id number] Boyce Medici GONFA Diagnosing Phys: Debbe Odea MD IMPRESSIONS  1. Severe concentric hypertrophy. Basal septum upto 2cm, lateral wall upto 1.7cm. consider CMR to evalute for HCM or Amyloid if clinically indicated.. Left ventricular ejection fraction, by estimation, is 35 to 40%. The left ventricle has moderately decreased function. The left ventricle demonstrates global hypokinesis. There is severe concentric left ventricular hypertrophy of the basal-septal segment. Left ventricular diastolic parameters are indeterminate.  2. Right ventricular systolic function is mildly reduced. The right ventricular size is mildly enlarged.  3. Left atrial size was mildly dilated.  4. Right atrial size was severely dilated.  5. The mitral valve is normal in structure. Mild mitral valve regurgitation.  6. The aortic valve is tricuspid. Aortic valve regurgitation is mild. Mild aortic valve sclerosis is present, with no evidence of aortic valve  stenosis.  7. The inferior vena cava is dilated in size with >50% respiratory variability, suggesting right atrial pressure of 8 mmHg. FINDINGS  Left Ventricle: Severe concentric hypertrophy. Basal septum upto 2cm, lateral wall upto 1.7cm. consider CMR to evalute for HCM or Amyloid if clinically indicated. Left ventricular ejection fraction, by estimation, is 35 to 40%. The left ventricle has moderately decreased function. The left ventricle demonstrates global hypokinesis. The left ventricular internal cavity size was normal in  size. There is severe concentric left ventricular hypertrophy of the basal-septal segment. Left ventricular diastolic parameters are indeterminate. Right Ventricle: The right ventricular size is mildly enlarged. No increase in right ventricular wall thickness. Right ventricular systolic function is mildly reduced. Left Atrium: Left atrial size was mildly dilated. Right Atrium: Right atrial size was severely dilated. Pericardium: There is no evidence of pericardial effusion. Mitral Valve: The mitral valve is normal in structure. Mild mitral valve regurgitation. Tricuspid Valve: The tricuspid valve is normal in structure. Tricuspid valve regurgitation is mild. Aortic Valve: The aortic valve is tricuspid. Aortic valve regurgitation is mild. Aortic regurgitation PHT measures 692 msec. Mild aortic valve sclerosis is present, with no evidence of aortic valve stenosis. Aortic valve peak gradient measures 4.9 mmHg. Pulmonic Valve: The pulmonic valve was not well visualized. Pulmonic valve regurgitation is not visualized. Aorta: The aortic root is normal in size and structure. Venous: The inferior vena cava is dilated in size with greater than 50% respiratory variability, suggesting right atrial pressure of 8 mmHg. IAS/Shunts: No atrial level shunt detected by color flow Doppler.  LEFT VENTRICLE PLAX 2D LVIDd:         4.10 cm     Diastology LVIDs:         3.41 cm     LV e' medial:    4.79 cm/s LV PW:          1.74 cm     LV E/e' medial:  14.2 LV IVS:        1.71 cm     LV e' lateral:   5.66 cm/s LVOT diam:     2.00 cm     LV E/e' lateral: 12.0 LV SV:         26 LV SV Index:   14 LVOT Area:     3.14 cm  LV Volumes (MOD) LV vol d, MOD A2C: 56.8 ml LV vol d, MOD A4C: 74.6 ml LV vol s, MOD A2C: 30.7 ml LV vol s, MOD A4C: 42.9 ml LV SV MOD A2C:     26.1 ml LV SV MOD A4C:     74.6 ml LV SV MOD BP:      32.1 ml RIGHT VENTRICLE RV Basal diam:  4.01 cm RV S prime:     7.18 cm/s TAPSE (M-mode): 1.2 cm LEFT ATRIUM           Index       RIGHT ATRIUM           Index LA diam:      4.30 cm 2.20 cm/m  RA Area:     31.10 cm LA Vol (A2C): 29.0 ml 14.82 ml/m RA Volume:   110.00 ml 56.23 ml/m LA Vol (A4C): 34.5 ml 17.63 ml/m  AORTIC VALVE                PULMONIC VALVE AV Area (Vmax): 1.57 cm    PV Vmax:       0.79 m/s AV Vmax:        111.00 cm/s PV Peak grad:  2.5 mmHg AV Peak Grad:   4.9 mmHg LVOT Vmax:      55.50 cm/s LVOT Vmean:     34.600 cm/s LVOT VTI:       0.084 m AI PHT:         692 msec  AORTA Ao Root diam: 3.30 cm Ao Asc diam:  3.40 cm MITRAL VALVE  TRICUSPID VALVE MV Area (PHT): 3.02 cm    TV Peak grad:   37.3 mmHg MV Decel Time: 251 msec    TV Vmax:        3.05 m/s MV E velocity: 68.10 cm/s                            SHUNTS                            Systemic VTI:  0.08 m                            Systemic Diam: 2.00 cm Debbe Odea MD Electronically signed by Debbe Odea MD Signature Date/Time: 01/11/2021/12:01:48 PM    Final     Assessment & Plan    87 year old male with history of paroxysmal atrial fibrillation, hypertension and hyperlipidemia who was admitted to [facility] after presenting to the ER with complaints of right ankle pain and swelling after a fall several weeks earlier.  He was noted to have a DVT.  He was placed on Eliquis at 5 mg twice daily.  He was also noted to have atrial fibrillation with rapid ventricular response.  Rate is currently controlled.  He is on metoprolol.   Echo was read as showing mildly reduced LV function. Echo done in 2019 showed ef of 40-45%.  There is severe LVH.  1.  Continue control A. fib with metoprolol succinate at current dose following rate.  Is on Eliquis at afib dose at 5 mg bid. He is 89, but weight is 84 kg and his creatinine is 0.79 Would continue with IV Lasix for now following renal function and electrolytes.   2.  DVT-currently on Eliquis at 5 mg twice daily.  DVT dose is 10 mg bid for 7 days however, given advanced age and relative fraility, will continue with 5 mg bid for now.    3. Hypertension-appears controlled at present.   4 Hyperlipidemia-continue with statin for now.   Signed, Darlin Priestly Shaheed Schmuck MD 01/13/2021, 8:20 AM  Pager: [phone]

## 2021-01-13 NOTE — Plan of Care (Signed)
  Problem: Education: Goal: Knowledge of General Education information will improve Description: Including pain rating scale, medication(s)/side effects and non-pharmacologic comfort measures 01/13/2021 0946 by Ansel Bong, RN Outcome: Progressing 01/13/2021 0946 by Ansel Bong, RN Outcome: Progressing   Problem: Health Behavior/Discharge Planning: Goal: Ability to manage health-related needs will improve 01/13/2021 0946 by Ansel Bong, RN Outcome: Progressing 01/13/2021 0946 by Ansel Bong, RN Outcome: Progressing   Problem: Clinical Measurements: Goal: Ability to maintain clinical measurements within normal limits will improve 01/13/2021 0946 by Ansel Bong, RN Outcome: Progressing 01/13/2021 0946 by Ansel Bong, RN Outcome: Progressing Goal: Will remain free from infection 01/13/2021 0946 by Ansel Bong, RN Outcome: Progressing 01/13/2021 0946 by Ansel Bong, RN Outcome: Progressing Goal: Diagnostic test results will improve 01/13/2021 0946 by Ansel Bong, RN Outcome: Progressing 01/13/2021 0946 by Ansel Bong, RN Outcome: Progressing Goal: Respiratory complications will improve 01/13/2021 0946 by Ansel Bong, RN Outcome: Progressing 01/13/2021 0946 by Ansel Bong, RN Outcome: Progressing Goal: Cardiovascular complication will be avoided 01/13/2021 0946 by Ansel Bong, RN Outcome: Progressing 01/13/2021 0946 by Ansel Bong, RN Outcome: Progressing   Problem: Activity: Goal: Risk for activity intolerance will decrease 01/13/2021 0946 by Ansel Bong, RN Outcome: Progressing 01/13/2021 0946 by Ansel Bong, RN Outcome: Progressing   Problem: Nutrition: Goal: Adequate nutrition will be maintained 01/13/2021 0946 by Ansel Bong, RN Outcome: Progressing 01/13/2021 0946 by Ansel Bong, RN Outcome: Progressing   Problem: Coping: Goal: Level of anxiety will decrease 01/13/2021 0946 by Ansel Bong, RN Outcome:  Progressing 01/13/2021 0946 by Ansel Bong, RN Outcome: Progressing   Problem: Elimination: Goal: Will not experience complications related to bowel motility 01/13/2021 0946 by Ansel Bong, RN Outcome: Progressing 01/13/2021 0946 by Ansel Bong, RN Outcome: Progressing Goal: Will not experience complications related to urinary retention 01/13/2021 0946 by Ansel Bong, RN Outcome: Progressing 01/13/2021 0946 by Ansel Bong, RN Outcome: Progressing   Problem: Pain Managment: Goal: General experience of comfort will improve 01/13/2021 0946 by Ansel Bong, RN Outcome: Progressing 01/13/2021 0946 by Ansel Bong, RN Outcome: Progressing   Problem: Safety: Goal: Ability to remain free from injury will improve 01/13/2021 0946 by Ansel Bong, RN Outcome: Progressing 01/13/2021 0946 by Ansel Bong, RN Outcome: Progressing   Problem: Skin Integrity: Goal: Risk for impaired skin integrity will decrease 01/13/2021 0946 by Ansel Bong, RN Outcome: Progressing 01/13/2021 0946 by Ansel Bong, RN Outcome: Progressing

## 2021-01-13 NOTE — Progress Notes (Signed)
PROGRESS NOTE  Kirk Sanchez GGE:[id number] DOB: 1925-05-15   PCP: [facility]  Patient is from: Home. Lives alone  DOA: 01/08/2021 LOS: 5  Chief complaints: Leg swelling and shortness of breath  Brief Narrative / Interim history: 87 year old M with PMH of A. fib, systolic CHF, HTN, HLD, hypothyroidism, diminished hearing, and anemia presenting with bilateral lower extremity swelling/edema, midsternal chest discomfort, shortness of breath, generalized weakness, poor PO intake and admitted for A. fib with RVR and possible CHF exacerbation.  He was started on IV Lasix.  RVR was short-lived, and he was started on metoprolol XL.  His troponin was mildly elevated.  BNP 220 but no prior to compare to.  CXR with mild bibasilar opacities.  D-dimer mildly elevated.  LE venous Doppler with isolated DVT involving the left peroneal vein.  He has been started on Eliquis for A. fib.  TTE with LVEF of 35 to 40%, severe concentric LVH, global hypokinesis, severe RAE and mildly reduced RV EF.  Cardiology consulted and had not additional input.   Main issue is hyponatremia which is likely hypervolemic.  Increased Lasix.   Subjective: Seen and examined earlier this morning.  No major events overnight of this morning.  No complaints.  He denies chest pain, shortness of breath, GI or UTI symptoms.  Objective: Vitals:   01/13/21 0425 01/13/21 0728 01/13/21 1141 01/13/21 1145  BP: (!) 147/98 140/87 (!) 147/115 104/75  Pulse: 72 65 72 64  Resp: 18 18 16    Temp: 98.4 F (36.9 C) 97.6 F (36.4 C) (!) 97.4 F (36.3 C)   TempSrc:  Oral    SpO2: 92% 94% 94% 94%  Weight: 83.9 kg     Height:        Intake/Output Summary (Last 24 hours) at 01/13/2021 1204 Last data filed at 01/13/2021 0940 Gross per 24 hour  Intake 1200 ml  Output 850 ml  Net 350 ml    Filed Weights   01/11/21 0630 01/12/21 0500 01/13/21 0425  Weight: 83.9 kg 84 kg 83.9 kg    Examination:  GENERAL: No apparent  distress.  Nontoxic. HEENT: MMM.  Vision and hearing grossly intact.  NECK: Supple.  No apparent JVD.  RESP: On RA.  No IWOB.  Fair aeration bilaterally. CVS:  RRR. Heart sounds normal.  ABD/GI/GU: BS+. Abd soft, NTND.  MSK/EXT:  Moves extremities. No apparent deformity.  1+ pitting edema bilaterally. SKIN: no apparent skin lesion or wound NEURO: Awake and alert. Oriented appropriately.  No apparent focal neuro deficit. PSYCH: Calm. Normal affect.   Procedures:  None  Microbiology summarized: COVID-19 PCR nonreactive.  Assessment & Plan: Acute on chronic systolic CHF: patient presents with dyspnea, chest discomfort and lower extremity edema.  TTE with LVEF of 35 to 40% (previously 40-45%), severe concentric LVH, severe RAE and mildly reduced RV SF.  BNP 220 but no prior to compare to.  Chest x-ray with bibasilar opacities.  Started on IV Lasix.  I&O incomplete as patient refuses to void in urinal.  Renal function stable.  Still has 1+ pitting edema bilaterally.  -Increase IV Lasix to 40 mg twice daily -Appreciate input from cardiology -Monitor fluid and respiratory status, and renal function.  Paroxysmal A. fib with RVR: Heart RVR to 130s but converted to sinus rhythm quickly.  TSH within normal.  HR in 60s to 70s now.  Not on medication at home. CHAD-2-Vasc score >4.  -Continue Toprol-XL to 25 mg daily -No Hx of fall but leaves  alone.  Started Eliquis after discussion with son, [name].   Hyponatremia: Likely hypervolemic.  Na 128>>>124>>122. Urine sodium will be unreliable while on IV Lasix.  TSH and a.m. cortisol within normal.  SIADH? -Increase IV Lasix to 40 mg twice daily -Recheck sodium in the morning -Liberated diet to regular.  Left peroneal vein DVT -Continue Eliquis. -Doubt need for CTA as he is already on Eliquis.  Chest discomfort/elevated troponin: likely demand ischemia from the above.  Resolved. -Continue metoprolol XL and statin  Hypothyroidism: TSH is  normal -Continue home synthroid  Normocytic anemia: H&H stable Recent Labs    12/05/20 1411 01/08/21 1550 01/09/21 0418 01/10/21 0518 01/11/21 0508 01/12/21 0644  HGB 11.6* 11.6* 11.7* 11.9* 10.7* 12.0*   Diminished hearing  Generalized weakness -Continue PT/OT.  Goal of care counseling: advanced age with the above comorbidity. Still full code which I believe could cause more harm than benefit.  I recommended DNR/DNI but patient's son prefers to continue full code.   Body mass index is 28.98 kg/m.         DVT prophylaxis:  SCDs Start: 01/08/21 1827 apixaban (ELIQUIS) tablet 5 mg  Code Status: Full code-confirmed Family Communication: Patient and RN.  Updated patient's son over the phone on 6/13. Level of care: Med-Surg Status is: Inpatient  Remains inpatient appropriate because:IV treatments appropriate due to intensity of illness or inability to take PO and Inpatient level of care appropriate due to severity of illness  Dispo: The patient is from: Home              Anticipated d/c is to: Home with home health and DME              Patient currently is not medically stable to d/c.   Difficult to place patient No       Consultants:  Cardiology  Sch Meds:  Scheduled Meds:  apixaban  5 mg Oral BID   furosemide  40 mg Intravenous BID   levothyroxine  25 mcg Oral Q0600   metoprolol succinate  25 mg Oral q1800   pantoprazole  40 mg Oral Daily   potassium chloride  40 mEq Oral Q4H   simvastatin  40 mg Oral q1800   Continuous Infusions: PRN Meds:.acetaminophen **OR** acetaminophen, haloperidol lactate, ondansetron **OR** ondansetron (ZOFRAN) IV  Antimicrobials: Anti-infectives (From admission, onward)    None        I have personally reviewed the following labs and images: CBC: Recent Labs  Lab 01/08/21 1550 01/09/21 0418 01/10/21 0518 01/11/21 0508 01/12/21 0644  WBC 7.6 9.1 7.7 6.9  --   NEUTROABS 5.7  --   --   --   --   HGB 11.6* 11.7*  11.9* 10.7* 12.0*  HCT 36.3* 37.1* 37.1* 32.6* 37.0*  MCV 89.9 89.6 87.5 87.6  --   PLT 204 218 226 213  --    BMP &GFR Recent Labs  Lab 01/08/21 1550 01/09/21 0418 01/10/21 0518 01/11/21 0508 01/12/21 0507 01/13/21 0605  NA 132* 130* 130* 127*  128* 124* 122*  K 4.0 4.1 4.1 3.6  3.6 4.3 3.4*  CL 95* 93* 92* 90*  92* 88* 83*  CO2 27 27 30 29  28 25 28   GLUCOSE 124* 126* 97 99  101* 111* 92  BUN 17 16 19  24*  25* 29* 24*  CREATININE 0.81 0.91 1.02 0.86  1.01 0.81 0.79  CALCIUM 9.1 8.8* 8.6* 8.1*  8.2* 8.2* 7.8*  MG 1.9  --  1.7 2.0 2.1 1.8  PHOS  --  4.4 3.9 3.3 3.3 2.3*   Estimated Creatinine Clearance: 59.7 mL/min (by C-G formula based on SCr of 0.79 mg/dL). Liver & Pancreas: Recent Labs  Lab 01/08/21 1550 01/09/21 0418 01/10/21 0518 01/11/21 0508 01/12/21 0507 01/13/21 0605  AST 25 27  --   --   --   --   ALT 12 14  --   --   --   --   ALKPHOS 60 61  --   --   --   --   BILITOT 0.9 1.2  --   --   --   --   PROT 6.3* 6.3*  --   --   --   --   ALBUMIN 3.5 3.4* 3.4* 2.8* 3.0* 2.7*   No results for input(s): LIPASE, AMYLASE in the last 168 hours. No results for input(s): AMMONIA in the last 168 hours. Diabetic: No results for input(s): HGBA1C in the last 72 hours.  Recent Labs  Lab 01/12/21 2007 01/12/21 2326  GLUCAP 137* 102*   Cardiac Enzymes: No results for input(s): CKTOTAL, CKMB, CKMBINDEX, TROPONINI in the last 168 hours. No results for input(s): PROBNP in the last 8760 hours. Coagulation Profile: No results for input(s): INR, PROTIME in the last 168 hours. Thyroid Function Tests: No results for input(s): TSH, T4TOTAL, FREET4, T3FREE, THYROIDAB in the last 72 hours.  Lipid Profile: No results for input(s): CHOL, HDL, LDLCALC, TRIG, CHOLHDL, LDLDIRECT in the last 72 hours.  Anemia Panel: No results for input(s): VITAMINB12, FOLATE, FERRITIN, TIBC, IRON, RETICCTPCT in the last 72 hours. Urine analysis:    Component Value Date/Time    COLORURINE YELLOW (A) 09/10/2017 2157   APPEARANCEUR HAZY (A) 09/10/2017 2157   LABSPEC 1.019 09/10/2017 2157   PHURINE 5.0 09/10/2017 2157   GLUCOSEU NEGATIVE 09/10/2017 2157   HGBUR MODERATE (A) 09/10/2017 2157   BILIRUBINUR NEGATIVE 09/10/2017 2157   KETONESUR 5 (A) 09/10/2017 2157   PROTEINUR 100 (A) 09/10/2017 2157   NITRITE NEGATIVE 09/10/2017 2157   LEUKOCYTESUR NEGATIVE 09/10/2017 2157   Sepsis Labs: Invalid input(s): PROCALCITONIN, LACTICIDVEN  Microbiology: Recent Results (from the past 240 hour(s))  Resp Panel by RT-PCR (Flu A&B, Covid) Nasopharyngeal Swab     Status: None   Collection Time: 01/08/21  4:03 PM   Specimen: Nasopharyngeal Swab; Nasopharyngeal(NP) swabs in vial transport medium  Result Value Ref Range Status   SARS Coronavirus 2 by RT PCR NEGATIVE NEGATIVE Final    Comment: (NOTE) SARS-CoV-2 target nucleic acids are NOT DETECTED.  The SARS-CoV-2 RNA is generally detectable in upper respiratory specimens during the acute phase of infection. The lowest concentration of SARS-CoV-2 viral copies this assay can detect is 138 copies/mL. A negative result does not preclude SARS-Cov-2 infection and should not be used as the sole basis for treatment or other patient management decisions. A negative result may occur with  improper specimen collection/handling, submission of specimen other than nasopharyngeal swab, presence of viral mutation(s) within the areas targeted by this assay, and inadequate number of viral copies(<138 copies/mL). A negative result must be combined with clinical observations, patient history, and epidemiological information. The expected result is Negative.  Fact Sheet for Patients:  BloggerCourse.com  Fact Sheet for Healthcare Providers:  SeriousBroker.it  This test is no t yet approved or cleared by the [place] FDA and  has been authorized for detection and/or diagnosis of  SARS-CoV-2 by FDA under an Emergency Use Authorization (EUA). This EUA will  remain  in effect (meaning this test can be used) for the duration of the COVID-19 declaration under Section 564(b)(1) of the Act, 21 U.S.C.section 360bbb-3(b)(1), unless the authorization is terminated  or revoked sooner.       Influenza A by PCR NEGATIVE NEGATIVE Final   Influenza B by PCR NEGATIVE NEGATIVE Final    Comment: (NOTE) The Xpert Xpress SARS-CoV-2/FLU/RSV plus assay is intended as an aid in the diagnosis of influenza from Nasopharyngeal swab specimens and should not be used as a sole basis for treatment. Nasal washings and aspirates are unacceptable for Xpert Xpress SARS-CoV-2/FLU/RSV testing.  Fact Sheet for Patients: BloggerCourse.comhttps://www.fda.gov/media/152166/download  Fact Sheet for Healthcare Providers: SeriousBroker.ithttps://www.fda.gov/media/152162/download  This test is not yet approved or cleared by the [place] FDA and has been authorized for detection and/or diagnosis of SARS-CoV-2 by FDA under an Emergency Use Authorization (EUA). This EUA will remain in effect (meaning this test can be used) for the duration of the COVID-19 declaration under Section 564(b)(1) of the Act, 21 U.S.C. section 360bbb-3(b)(1), unless the authorization is terminated or revoked.  Performed at [facility], [address] [place], [place] [id number]     Radiology Studies: No results found.     Maryann Mccall T. Alin Hutchins [facility]  If 7PM-7AM, please contact night-coverage [email or web address] 01/13/2021, 12:04 PM

## 2021-01-14 ENCOUNTER — Inpatient Hospital Stay: Payer: Medicare Other

## 2021-01-14 ENCOUNTER — Encounter: Payer: Self-pay | Admitting: Student

## 2021-01-14 DIAGNOSIS — I5023 Acute on chronic systolic (congestive) heart failure: Secondary | ICD-10-CM | POA: Diagnosis not present

## 2021-01-14 DIAGNOSIS — E871 Hypo-osmolality and hyponatremia: Secondary | ICD-10-CM | POA: Diagnosis not present

## 2021-01-14 DIAGNOSIS — I4891 Unspecified atrial fibrillation: Secondary | ICD-10-CM | POA: Diagnosis not present

## 2021-01-14 DIAGNOSIS — E039 Hypothyroidism, unspecified: Secondary | ICD-10-CM | POA: Diagnosis not present

## 2021-01-14 LAB — RENAL FUNCTION PANEL
Albumin: 3.3 g/dL — ABNORMAL LOW (ref 3.5–5.0)
Anion gap: 11 (ref 5–15)
BUN: 21 mg/dL (ref 8–23)
CO2: 28 mmol/L (ref 22–32)
Calcium: 8.1 mg/dL — ABNORMAL LOW (ref 8.9–10.3)
Chloride: 79 mmol/L — ABNORMAL LOW (ref 98–111)
Creatinine, Ser: 0.78 mg/dL (ref 0.61–1.24)
GFR, Estimated: >60 mL/min (ref >60)
Glucose, Bld: 98 mg/dL (ref 70–99)
Phosphorus: 2.7 mg/dL (ref 2.5–4.6)
Potassium: 4.5 mmol/L (ref 3.5–5.1)
Sodium: 118 mmol/L — CL (ref 135–145)

## 2021-01-14 LAB — BASIC METABOLIC PANEL
Anion gap: 9 (ref 5–15)
BUN: 21 mg/dL (ref 8–23)
CO2: 30 mmol/L (ref 22–32)
Calcium: 8.2 mg/dL — ABNORMAL LOW (ref 8.9–10.3)
Chloride: 78 mmol/L — ABNORMAL LOW (ref 98–111)
Creatinine, Ser: 0.95 mg/dL (ref 0.61–1.24)
GFR, Estimated: >60 mL/min (ref >60)
Glucose, Bld: 111 mg/dL — ABNORMAL HIGH (ref 70–99)
Potassium: 4.6 mmol/L (ref 3.5–5.1)
Sodium: 117 mmol/L — CL (ref 135–145)

## 2021-01-14 LAB — HEPATIC FUNCTION PANEL
ALT: 50 U/L — ABNORMAL HIGH (ref 0–44)
AST: 62 U/L — ABNORMAL HIGH (ref 15–41)
Albumin: 3.4 g/dL — ABNORMAL LOW (ref 3.5–5.0)
Alkaline Phosphatase: 81 U/L (ref 38–126)
Bilirubin, Direct: 0.3 mg/dL — ABNORMAL HIGH (ref 0.0–0.2)
Indirect Bilirubin: 1 mg/dL — ABNORMAL HIGH (ref 0.3–0.9)
Total Bilirubin: 1.3 mg/dL — ABNORMAL HIGH (ref 0.3–1.2)
Total Protein: 6.3 g/dL — ABNORMAL LOW (ref 6.5–8.1)

## 2021-01-14 LAB — CBC
HCT: 37.8 % — ABNORMAL LOW (ref 39.0–52.0)
Hemoglobin: 12.6 g/dL — ABNORMAL LOW (ref 13.0–17.0)
MCH: 28 pg (ref 26.0–34.0)
MCHC: 33.3 g/dL (ref 30.0–36.0)
MCV: 84 fL (ref 80.0–100.0)
Platelets: 272 10*3/uL (ref 150–400)
RBC: 4.5 MIL/uL (ref 4.22–5.81)
RDW: 15.6 % — ABNORMAL HIGH (ref 11.5–15.5)
WBC: 7.8 10*3/uL (ref 4.0–10.5)
nRBC: 0 % (ref 0.0–0.2)

## 2021-01-14 LAB — SODIUM: Sodium: 115 mmol/L — CL (ref 135–145)

## 2021-01-14 LAB — MAGNESIUM: Magnesium: 1.8 mg/dL (ref 1.7–2.4)

## 2021-01-14 MED ORDER — IOHEXOL 300 MG/ML  SOLN
75.0000 mL | Freq: Once | INTRAMUSCULAR | Status: AC | PRN
  Administered 2021-01-14: 75 mL via INTRAVENOUS

## 2021-01-14 MED ORDER — MAGNESIUM SULFATE 2 GM/50ML IV SOLN
2.0000 g | Freq: Once | INTRAVENOUS | Status: AC
  Administered 2021-01-14: 2 g via INTRAVENOUS
  Filled 2021-01-14: qty 50

## 2021-01-14 MED ORDER — TOLVAPTAN 15 MG PO TABS
15.0000 mg | ORAL_TABLET | Freq: Once | ORAL | Status: AC
Start: 2021-01-14 — End: 2021-01-14
  Administered 2021-01-14: 15 mg via ORAL
  Filled 2021-01-14 (×2): qty 1

## 2021-01-14 NOTE — Progress Notes (Signed)
Mobility Specialist - Progress Note   01/14/21 1400  Mobility  Activity Contraindicated/medical hold  Mobility performed by Mobility specialist    Per chart review, pt with low Sodium of 117 this date, contraindicating mobility. Will attempt session another date/time as appropriate.    Kirk Sanchez Mobility Specialist 01/14/21, 2:57 PM

## 2021-01-14 NOTE — Progress Notes (Signed)
DR. Allena Katz NOTIFIED OF CRITICAL SODIUM OF 118. SEE NEW ORDERS.

## 2021-01-14 NOTE — Progress Notes (Signed)
Patient Name: Kirk Sanchez Date of Encounter: 01/14/2021  Hospital Problem List     Principal Problem:   Shortness of breath Active Problems:   HLD (hyperlipidemia)   Hypothyroidism   Anemia   Atrial fibrillation (HCC)   CHF (congestive heart failure), NYHA class II, chronic, diastolic (HCC)   SOB (shortness of breath)   Acute on chronic systolic CHF (congestive heart failure) [facility](HCC)    Patient Profile        87 y.o. male with history of atrial fibrillation, hyperlipidemia, hypertension who was seen in the ER with complaints of a fall 2 weeks prior to presentation with right ankle pain.  Also complained of some chest discomfort.  EKG showed A. fib interventricular conduction delay with RVR.  High-sensitivity troponin was minimally elevated.  Chest x-ray shows stable cardiomegaly with no CHF.  Ankle x-ray showed no acute abnormality.  Lower extremity ultrasound showed DVT in the peroneal veins of the left lower extremity.  No DVT on the right.  He was treated with simvastatin, metoprolol and furosemide.   Telemetry currently shows A. fib with controlled ventricular response.  Cardiology consultation requested.  Patient denies chest pain or shortness of breath.  He is currently on Eliquis at 5 mg twice daily for DVT and his A. fib.  Rate is currently being controlled with metoprolol succinate at 25 mg daily.  He is on furosemide 40 mg IV daily.  He is also on simvastatin at 40 mg daily for hyperlipidemia.  He is currently hemodynamically stable.  Echo was read as showing an ejection fraction of 35 to 40% with global hypokinesis.  LVH.    Subjective   Poor historian. No cardiac complaints  Inpatient Medications     apixaban  5 mg Oral BID   levothyroxine  25 mcg Oral Q0600   metoprolol succinate  25 mg Oral q1800   pantoprazole  40 mg Oral Daily   simvastatin  40 mg Oral q1800   tolvaptan  15 mg Oral Once    Vital Signs    Vitals:   01/13/21 2350 01/14/21 0130 01/14/21 0750  01/14/21 1126  BP: 107/74  (!) 124/91 (!) 127/96  Pulse: 70  62 74  Resp: 16   (!) 21  Temp: 98.2 F (36.8 C)  97.6 F (36.4 C) 97.9 F (36.6 C)  TempSrc: Oral  Oral Oral  SpO2: 94%  97% 98%  Weight:  84.5 kg    Height:        Intake/Output Summary (Last 24 hours) at 01/14/2021 1258 Last data filed at 01/14/2021 1017 Gross per 24 hour  Intake 780 ml  Output 200 ml  Net 580 ml   Filed Weights   01/12/21 0500 01/13/21 0425 01/14/21 0130  Weight: 84 kg 83.9 kg 84.5 kg    Physical Exam      HEENT: normal.  Neck: Supple, no JVD, carotid bruits, or masses. Cardiac: irr, irr Respiratory:  Respirations regular and unlabored, clear to auscultation bilaterally. GI: Soft, nontender, nondistended, BS + x 4. MS: no deformity or atrophy. Skin: warm and dry, no rash. Neuro:  Nonfocal  Labs    CBC Recent Labs    01/12/21 0644 01/14/21 0719  WBC  --  7.8  HGB 12.0* 12.6*  HCT 37.0* 37.8*  MCV  --  84.0  PLT  --  272   Basic Metabolic Panel Recent Labs    65/78/4606/14/22 0605 01/14/21 0719  NA 122* 118*  K 3.4* 4.5  CL 83* 79*  CO2 28 28  GLUCOSE 92 98  BUN 24* 21  CREATININE 0.79 0.78  CALCIUM 7.8* 8.1*  MG 1.8 1.8  PHOS 2.3* 2.7   Liver Function Tests Recent Labs    01/13/21 0605 01/14/21 0719  ALBUMIN 2.7* 3.3*   No results for input(s): LIPASE, AMYLASE in the last 72 hours. Cardiac Enzymes No results for input(s): CKTOTAL, CKMB, CKMBINDEX, TROPONINI in the last 72 hours. BNP No results for input(s): BNP in the last 72 hours. D-Dimer No results for input(s): DDIMER in the last 72 hours. Hemoglobin A1C No results for input(s): HGBA1C in the last 72 hours. Fasting Lipid Panel No results for input(s): CHOL, HDL, LDLCALC, TRIG, CHOLHDL, LDLDIRECT in the last 72 hours. Thyroid Function Tests No results for input(s): TSH, T4TOTAL, T3FREE, THYROIDAB in the last 72 hours.  Invalid input(s): FREET3  Telemetry    Afib with lbbb  ECG    Afib with  lbbb  Radiology    DG Ankle Complete Right  Result Date: 01/09/2021 CLINICAL DATA:  Right ankle pain. EXAM: RIGHT ANKLE - COMPLETE 3+ VIEW COMPARISON:  None. FINDINGS: There is no evidence of fracture, dislocation, or joint effusion. There is no evidence of arthropathy or other focal bone abnormality. Vascular calcifications are noted. IMPRESSION: No acute abnormality seen. Electronically Signed   By: Lupita Raider M.D.   On: 01/09/2021 13:19   US Venous Img Lower Bilateral (DVT)  Result Date: 01/10/2021 CLINICAL DATA:  Elevated D-dimer EXAM: BILATERAL LOWER EXTREMITY VENOUS DOPPLER ULTRASOUND TECHNIQUE: Gray-scale sonography with graded compression, as well as color Doppler and duplex ultrasound were performed to evaluate the lower extremity deep venous systems from the level of the common femoral vein and including the common femoral, femoral, profunda femoral, popliteal and calf veins including the posterior tibial, peroneal and gastrocnemius veins when visible. The superficial great saphenous vein was also interrogated. Spectral Doppler was utilized to evaluate flow at rest and with distal augmentation maneuvers in the common femoral, femoral and popliteal veins. COMPARISON:  None. FINDINGS: RIGHT LOWER EXTREMITY Common Femoral Vein: No evidence of thrombus. Normal compressibility, respiratory phasicity and response to augmentation. Saphenofemoral Junction: No evidence of thrombus. Normal compressibility and flow on color Doppler imaging. Profunda Femoral Vein: No evidence of thrombus. Normal compressibility and flow on color Doppler imaging. Femoral Vein: No evidence of thrombus. Normal compressibility, respiratory phasicity and response to augmentation. Popliteal Vein: No evidence of thrombus. Normal compressibility, respiratory phasicity and response to augmentation. Calf Veins: No evidence of thrombus. Normal compressibility and flow on color Doppler imaging. Superficial Great Saphenous Vein: No  evidence of thrombus. Normal compressibility. Venous Reflux:  None. Other Findings:  None. LEFT LOWER EXTREMITY Common Femoral Vein: No evidence of thrombus. Normal compressibility, respiratory phasicity and response to augmentation. Saphenofemoral Junction: No evidence of thrombus. Normal compressibility and flow on color Doppler imaging. Profunda Femoral Vein: No evidence of thrombus. Normal compressibility and flow on color Doppler imaging. Femoral Vein: No evidence of thrombus. Normal compressibility, respiratory phasicity and response to augmentation. Popliteal Vein: No evidence of thrombus. Normal compressibility, respiratory phasicity and response to augmentation. Calf Veins: Color flow visualized within the posterior tibial veins. However, no definitive cover flow identified within the peroneal veins. The peroneal vein lumen is difficult to visualize but appears noncompressible and distended with internal low-level echoes. Findings concerning for occlusive calf vein DVT. Superficial Great Saphenous Vein: No evidence of thrombus. Normal compressibility. Venous Reflux:  None. Other Findings:  None. IMPRESSION: Positive  for isolated DVT involving the peroneal veins in the left lower extremity. No evidence of acute DVT in the right lower extremity, or above the knee in the left lower extremity. These results will be called to the ordering clinician or representative by the Radiologist Assistant, and communication documented in the PACS or [employer]. Electronically Signed   By: Malachy Moan M.D.   On: 01/10/2021 11:22   DG Chest Portable 1 View  Result Date: 01/08/2021 CLINICAL DATA:  Shortness of breath. EXAM: PORTABLE CHEST 1 VIEW COMPARISON:  September 15, 2017. FINDINGS: Stable cardiomegaly. No pneumothorax is noted. Mild bibasilar atelectasis or infiltrates are noted. No significant pleural effusion is noted. Bony thorax is unremarkable. IMPRESSION: Mild bibasilar subsegmental atelectasis or  infiltrates. Aortic Atherosclerosis (ICD10-I70.0). Electronically Signed   By: Lupita Raider M.D.   On: 01/08/2021 16:19   ECHOCARDIOGRAM COMPLETE  Result Date: 01/11/2021    ECHOCARDIOGRAM REPORT   Patient Name:   Kirk Sanchez Date of Exam: 01/11/2021 Medical Rec #:  [id number]      Height:       67.0 in Accession #:    [id number]     Weight:       185.0 lb Date of Birth:  1925/06/26     BSA:          1.956 m Patient Age:    87 years       BP:           101/63 mmHg Patient Gender: M              HR:           88 bpm. Exam Location:  [facility] Procedure: 2D Echo Indications:     Dyspnea R06.00  History:         Patient has prior history of Echocardiogram examinations, most                  recent 09/11/2017.  Sonographer:     Overton Mam RDCS Referring Phys:  [id number] Boyce Medici GONFA Diagnosing Phys: Debbe Odea MD IMPRESSIONS  1. Severe concentric hypertrophy. Basal septum upto 2cm, lateral wall upto 1.7cm. consider CMR to evalute for HCM or Amyloid if clinically indicated.. Left ventricular ejection fraction, by estimation, is 35 to 40%. The left ventricle has moderately decreased function. The left ventricle demonstrates global hypokinesis. There is severe concentric left ventricular hypertrophy of the basal-septal segment. Left ventricular diastolic parameters are indeterminate.  2. Right ventricular systolic function is mildly reduced. The right ventricular size is mildly enlarged.  3. Left atrial size was mildly dilated.  4. Right atrial size was severely dilated.  5. The mitral valve is normal in structure. Mild mitral valve regurgitation.  6. The aortic valve is tricuspid. Aortic valve regurgitation is mild. Mild aortic valve sclerosis is present, with no evidence of aortic valve stenosis.  7. The inferior vena cava is dilated in size with >50% respiratory variability, suggesting right atrial pressure of 8 mmHg. FINDINGS  Left Ventricle: Severe concentric hypertrophy. Basal septum upto 2cm, lateral  wall upto 1.7cm. consider CMR to evalute for HCM or Amyloid if clinically indicated. Left ventricular ejection fraction, by estimation, is 35 to 40%. The left ventricle has moderately decreased function. The left ventricle demonstrates global hypokinesis. The left ventricular internal cavity size was normal in size. There is severe concentric left ventricular hypertrophy of the basal-septal segment. Left ventricular diastolic parameters are indeterminate. Right Ventricle: The right ventricular size is mildly enlarged.  No increase in right ventricular wall thickness. Right ventricular systolic function is mildly reduced. Left Atrium: Left atrial size was mildly dilated. Right Atrium: Right atrial size was severely dilated. Pericardium: There is no evidence of pericardial effusion. Mitral Valve: The mitral valve is normal in structure. Mild mitral valve regurgitation. Tricuspid Valve: The tricuspid valve is normal in structure. Tricuspid valve regurgitation is mild. Aortic Valve: The aortic valve is tricuspid. Aortic valve regurgitation is mild. Aortic regurgitation PHT measures 692 msec. Mild aortic valve sclerosis is present, with no evidence of aortic valve stenosis. Aortic valve peak gradient measures 4.9 mmHg. Pulmonic Valve: The pulmonic valve was not well visualized. Pulmonic valve regurgitation is not visualized. Aorta: The aortic root is normal in size and structure. Venous: The inferior vena cava is dilated in size with greater than 50% respiratory variability, suggesting right atrial pressure of 8 mmHg. IAS/Shunts: No atrial level shunt detected by color flow Doppler.  LEFT VENTRICLE PLAX 2D LVIDd:         4.10 cm     Diastology LVIDs:         3.41 cm     LV e' medial:    4.79 cm/s LV PW:         1.74 cm     LV E/e' medial:  14.2 LV IVS:        1.71 cm     LV e' lateral:   5.66 cm/s LVOT diam:     2.00 cm     LV E/e' lateral: 12.0 LV SV:         26 LV SV Index:   14 LVOT Area:     3.14 cm  LV Volumes (MOD)  LV vol d, MOD A2C: 56.8 ml LV vol d, MOD A4C: 74.6 ml LV vol s, MOD A2C: 30.7 ml LV vol s, MOD A4C: 42.9 ml LV SV MOD A2C:     26.1 ml LV SV MOD A4C:     74.6 ml LV SV MOD BP:      32.1 ml RIGHT VENTRICLE RV Basal diam:  4.01 cm RV S prime:     7.18 cm/s TAPSE (M-mode): 1.2 cm LEFT ATRIUM           Index       RIGHT ATRIUM           Index LA diam:      4.30 cm 2.20 cm/m  RA Area:     31.10 cm LA Vol (A2C): 29.0 ml 14.82 ml/m RA Volume:   110.00 ml 56.23 ml/m LA Vol (A4C): 34.5 ml 17.63 ml/m  AORTIC VALVE                PULMONIC VALVE AV Area (Vmax): 1.57 cm    PV Vmax:       0.79 m/s AV Vmax:        111.00 cm/s PV Peak grad:  2.5 mmHg AV Peak Grad:   4.9 mmHg LVOT Vmax:      55.50 cm/s LVOT Vmean:     34.600 cm/s LVOT VTI:       0.084 m AI PHT:         692 msec  AORTA Ao Root diam: 3.30 cm Ao Asc diam:  3.40 cm MITRAL VALVE               TRICUSPID VALVE MV Area (PHT): 3.02 cm    TV Peak grad:   37.3 mmHg MV Decel Time: 251 msec  TV Vmax:        3.05 m/s MV E velocity: 68.10 cm/s                            SHUNTS                            Systemic VTI:  0.08 m                            Systemic Diam: 2.00 cm Debbe Odea MD Electronically signed by Debbe Odea MD Signature Date/Time: 01/11/2021/12:01:48 PM    Final     Assessment & Plan     87 year old male with history of paroxysmal atrial fibrillation, hypertension and hyperlipidemia who was admitted to [facility] after presenting to the ER with complaints of right ankle pain and swelling after a fall several weeks earlier.  He was noted to have a DVT.  He was placed on Eliquis at 5 mg twice daily.  He was also noted to have atrial fibrillation with rapid ventricular response.  Rate is currently controlled.  He is on metoprolol.  Echo was read as showing mildly reduced LV function. Echo done in 2019 showed ef of 40-45%.  There is severe LVH.  1.  Continue control A. fib with metoprolol succinate at current dose following rate.  Is on Eliquis  at afib dose at 5 mg bid. He is 89, but weight is 84 kg and his creatinine is 0.79 Would continue with IV Lasix for now following renal function and electrolytes.   2.  DVT-currently on Eliquis at 5 mg twice daily.  DVT dose is 10 mg bid for 7 days however, given advanced age and relative fraility, will continue with 5 mg bid for now.    3. Hypertension-appears controlled at present.   4 Hyperlipidemia-continue with statin for now.   5. Hyponatremia-Is 118 this am. Was 130 on admission.  Agree with tolvaptan.     Signed, Darlin Priestly Artice Bergerson MD 01/14/2021, 12:58 PM  Pager: [phone]

## 2021-01-14 NOTE — Consult Note (Signed)
[facility]  CONSULT NOTE    Date: 01/14/2021                  Patient Name:  Kirk Sanchez  MRN: [id number]  DOB: [birth year]  Age / Sex: 87 y.o., male         PC[facility]                 Service Requesting Consult: TRH                 Reason for Consult: Hyponatremia            History of Present Illness: Kirk Sanchez is a 87 y.o.  male with previous medical history of HTN, hyperlipidemia, kidney stones, A-fib and thyroid disease who was admitted to [facility] on 01/08/2021 for Shortness of breath.  We have been consulted due to decreasing sodium levels. He has been treated with IV Lasix for fluid overload related to CHF. Sodium levels appear to be subtherapeutic during this admission. This morning he had a level of 118. Primary team has held lasix. Patient denies poor appetite or excessive thirst. Denies nausea and vomiting. Denies shortness of breath. Current ECHO has EF 35-%.   Medications: Outpatient medications: Medications Prior to Admission  Medication Sig Dispense Refill Last Dose   aspirin 81 MG tablet Take 81 mg by mouth daily.   01/08/2021 at 0800   levothyroxine (SYNTHROID) 25 MCG tablet TAKE 1 TABLET BY MOUTH ONCE DAILY ON AN EMPTY STOMACH. WAIT 30 MINUTES BEFORE TAKING OTHER MEDS. (Patient taking differently: Take 25 mcg by mouth daily before breakfast. TAKE 1 TABLET BY MOUTH ONCE DAILY ON AN EMPTY STOMACH. WAIT 30 MINUTES BEFORE TAKING OTHER MEDS.) 90 tablet 3 01/08/2021 at 0800   simvastatin (ZOCOR) 40 MG tablet TAKE 1 TABLET BY MOUTH AT BEDTIME (Patient taking differently: Take 40 mg by mouth at bedtime. TAKE 1 TABLET BY MOUTH AT BEDTIME) 90 tablet 1 01/07/2021 at 2000   Elastic Bandages & Supports (MEDICAL COMPRESSION STOCKINGS) MISC Put on both legs in the morning, take off at night; do NOT sleep in them 2 each 1     Current medications: Current Facility-Administered Medications  Medication Dose Route Frequency Provider  Last Rate Last Admin   acetaminophen (TYLENOL) tablet 650 mg  650 mg Oral Q6H PRN Gertha CalkinPatel, Ekta V, MD       Or   acetaminophen (TYLENOL) suppository 650 mg  650 mg Rectal Q6H PRN Gertha CalkinPatel, Ekta V, MD       apixaban (ELIQUIS) tablet 5 mg  5 mg Oral BID Candelaria StagersGonfa, Taye T, MD   5 mg at 01/14/21 0934   levothyroxine (SYNTHROID) tablet 25 mcg  25 mcg Oral Q0600 Gertha CalkinPatel, Ekta V, MD   25 mcg at 01/14/21 0611   metoprolol succinate (TOPROL-XL) 24 hr tablet 25 mg  25 mg Oral q1800 Candelaria StagersGonfa, Taye T, MD   25 mg at 01/13/21 1748   ondansetron (ZOFRAN) tablet 4 mg  4 mg Oral Q6H PRN Gertha CalkinPatel, Ekta V, MD       Or   ondansetron (ZOFRAN) injection 4 mg  4 mg Intravenous Q6H PRN Gertha CalkinPatel, Ekta V, MD       pantoprazole (PROTONIX) EC tablet 40 mg  40 mg Oral Daily Ronnald Rampatel, Kishan S, RPH   40 mg at 01/14/21 0934   simvastatin (ZOCOR) tablet 40 mg  40 mg Oral q1800 Gertha CalkinPatel, Ekta V, MD   40 mg  at 01/13/21 1748   tolvaptan (SAMSCA) tablet 15 mg  15 mg Oral Once Lateef, Munsoor, MD          Allergies: No Known Allergies    Past Medical History: Past Medical History:  Diagnosis Date   Atrial fibrillation (HCC)    Hyperlipidemia    Hypertension    Kidney stone    Presbycusis of both ears 03/06/2016   Thyroid disease      Past Surgical History: Past Surgical History:  Procedure Laterality Date   HAND ARTHROPLASTY     HIP ARTHROPLASTY     STENT PLACEMENT RT URETER ([facility] HX)       Family History: Family History  Problem Relation Age of Onset   Cancer Mother        stomach   Asthma Father    Stroke Neg Hx    Heart disease Neg Hx      Social History: Social History   Socioeconomic History   Marital status: Widowed    Spouse name: Not on file   Number of children: 3   Years of education: Not on file   Highest education level: High school graduate  Occupational History   Occupation: Retired  Tobacco Use   Smoking status: Never   Smokeless tobacco: Never  Vaping Use   Vaping Use: Never used  Substance and  Sexual Activity   Alcohol use: No    Alcohol/week: 0.0 standard drinks   Drug use: No   Sexual activity: Not Currently  Other Topics Concern   Not on file  Social History Narrative   Pt lives alone.    Social Determinants of Health   Financial Resource Strain: Low Risk    Difficulty of Paying Living Expenses: Not hard at all  Food Insecurity: No Food Insecurity   Worried About Programme researcher, broadcasting/film/video in the Last Year: Never true   Ran Out of Food in the Last Year: Never true  Transportation Needs: No Transportation Needs   Lack of Transportation (Medical): No   Lack of Transportation (Non-Medical): No  Physical Activity: Inactive   Days of Exercise per Week: 0 days   Minutes of Exercise per Session: 0 min  Stress: No Stress Concern Present   Feeling of Stress : Not at all  Social Connections: Moderately Isolated   Frequency of Communication with Friends and Family: More than three times a week   Frequency of Social Gatherings with Friends and Family: More than three times a week   Attends Religious Services: More than 4 times per year   Active Member of [employer] or Organizations: No   Attends Banker Meetings: Never   Marital Status: Widowed  Catering manager Violence: Not At Risk   Fear of Current or Ex-Partner: No   Emotionally Abused: No   Physically Abused: No   Sexually Abused: No     Review of Systems: Review of Systems  HENT:  Positive for hearing loss.   Cardiovascular:  Positive for leg swelling.  Musculoskeletal:  Positive for falls.  All other systems reviewed and are negative.  Vital Signs: Blood pressure (!) 127/96, pulse 74, temperature 97.9 F (36.6 C), temperature source Oral, resp. rate (!) 21, height 5\' 7"  (1.702 m), weight 84.5 kg, SpO2 98 %.  Weight trends: Filed Weights   01/12/21 0500 01/13/21 0425 01/14/21 0130  Weight: 84 kg 83.9 kg 84.5 kg    Physical Exam: General: NAD, laying in bed  Head: Normocephalic, atraumatic. Moist  oral  mucosal membranes, hard of hearing  Eyes: Anicteric  Lungs:  Clear to auscultation, normal effort  Heart: Regular rate and rhythm  Abdomen:  Soft, nontender,   Extremities:  1+ peripheral edema.  Neurologic: Nonfocal, moving all four extremities  Skin: No lesions        Lab results: Basic Metabolic Panel: Recent Labs  Lab 01/10/21 0518 01/11/21 0508 01/12/21 0507 01/13/21 0605 01/14/21 0719 01/14/21 1258  NA 130* 127*  128* 124* 122* 118* 117*  K 4.1 3.6  3.6 4.3 3.4* 4.5 4.6  CL 92* 90*  92* 88* 83* 79* 78*  CO2 30 29  28 25 28 28 30   GLUCOSE 97 99  101* 111* 92 98 111*  BUN 19 24*  25* 29* 24* 21 21  CREATININE 1.02 0.86  1.01 0.81 0.79 0.78 0.95  CALCIUM 8.6* 8.1*  8.2* 8.2* 7.8* 8.1* 8.2*  MG 1.7 2.0 2.1 1.8 1.8  --   PHOS 3.9 3.3 3.3 2.3* 2.7  --     Liver Function Tests: Recent Labs  Lab 01/08/21 1550 01/09/21 0418 01/10/21 0518 01/13/21 0605 01/14/21 0719 01/14/21 1258  AST 25 27  --   --   --  62*  ALT 12 14  --   --   --  50*  ALKPHOS 60 61  --   --   --  81  BILITOT 0.9 1.2  --   --   --  1.3*  PROT 6.3* 6.3*  --   --   --  6.3*  ALBUMIN 3.5 3.4*   < > 2.7* 3.3* 3.4*   < > = values in this interval not displayed.   No results for input(s): LIPASE, AMYLASE in the last 168 hours. No results for input(s): AMMONIA in the last 168 hours.  CBC: Recent Labs  Lab 01/08/21 1550 01/09/21 0418 01/10/21 0518 01/11/21 0508 01/12/21 0644 01/14/21 0719  WBC 7.6 9.1 7.7 6.9  --  7.8  NEUTROABS 5.7  --   --   --   --   --   HGB 11.6* 11.7* 11.9* 10.7* 12.0* 12.6*  HCT 36.3* 37.1* 37.1* 32.6* 37.0* 37.8*  MCV 89.9 89.6 87.5 87.6  --  84.0  PLT 204 218 226 213  --  272    Cardiac Enzymes: No results for input(s): CKTOTAL, CKMB, CKMBINDEX, TROPONINI in the last 168 hours.  BNP: Invalid input(s): POCBNP  CBG: Recent Labs  Lab 01/12/21 2007 01/12/21 2326  GLUCAP 137* 102*    Microbiology: Results for orders placed or performed  during the hospital encounter of 01/08/21  Resp Panel by RT-PCR (Flu A&B, Covid) Nasopharyngeal Swab     Status: None   Collection Time: 01/08/21  4:03 PM   Specimen: Nasopharyngeal Swab; Nasopharyngeal(NP) swabs in vial transport medium  Result Value Ref Range Status   SARS Coronavirus 2 by RT PCR NEGATIVE NEGATIVE Final    Comment: (NOTE) SARS-CoV-2 target nucleic acids are NOT DETECTED.  The SARS-CoV-2 RNA is generally detectable in upper respiratory specimens during the acute phase of infection. The lowest concentration of SARS-CoV-2 viral copies this assay can detect is 138 copies/mL. A negative result does not preclude SARS-Cov-2 infection and should not be used as the sole basis for treatment or other patient management decisions. A negative result may occur with  improper specimen collection/handling, submission of specimen other than nasopharyngeal swab, presence of viral mutation(s) within the areas targeted by this assay, and inadequate number of viral copies(<138 copies/mL).  A negative result must be combined with clinical observations, patient history, and epidemiological information. The expected result is Negative.  Fact Sheet for Patients:  BloggerCourse.com  Fact Sheet for Healthcare Providers:  SeriousBroker.it  This test is no t yet approved or cleared by the [place] FDA and  has been authorized for detection and/or diagnosis of SARS-CoV-2 by FDA under an Emergency Use Authorization (EUA). This EUA will remain  in effect (meaning this test can be used) for the duration of the COVID-19 declaration under Section 564(b)(1) of the Act, 21 U.S.C.section 360bbb-3(b)(1), unless the authorization is terminated  or revoked sooner.       Influenza A by PCR NEGATIVE NEGATIVE Final   Influenza B by PCR NEGATIVE NEGATIVE Final    Comment: (NOTE) The Xpert Xpress SARS-CoV-2/FLU/RSV plus assay is intended as an  aid in the diagnosis of influenza from Nasopharyngeal swab specimens and should not be used as a sole basis for treatment. Nasal washings and aspirates are unacceptable for Xpert Xpress SARS-CoV-2/FLU/RSV testing.  Fact Sheet for Patients: BloggerCourse.com  Fact Sheet for Healthcare Providers: SeriousBroker.it  This test is not yet approved or cleared by the [place] FDA and has been authorized for detection and/or diagnosis of SARS-CoV-2 by FDA under an Emergency Use Authorization (EUA). This EUA will remain in effect (meaning this test can be used) for the duration of the COVID-19 declaration under Section 564(b)(1) of the Act, 21 U.S.C. section 360bbb-3(b)(1), unless the authorization is terminated or revoked.  Performed at [facility], [address] [place], [place] [address]     Coagulation Studies: No results for input(s): LABPROT, INR in the last 72 hours.  Urinalysis: No results for input(s): COLORURINE, LABSPEC, PHURINE, GLUCOSEU, HGBUR, BILIRUBINUR, KETONESUR, PROTEINUR, UROBILINOGEN, NITRITE, LEUKOCYTESUR in the last 72 hours.  Invalid input(s): APPERANCEUR    Imaging: No results found.   Assessment & Plan: Kirk Sanchez is a 87 y.o.  male with previous medical history of HTN, hyperlipidemia, kidney stones, A-fib and thyroid disease, who was admitted to [facility] on 01/08/2021 for SOB (shortness of breath) [R06.02] Right ankle pain [M25.571] Acute on chronic systolic CHF (congestive heart failure) (HCC) [I50.23] Acute on chronic heart failure, unspecified heart failure type (HCC) [I50.9] Atrial fibrillation, unspecified type (HCC) [I48.91]   Hyponatremia: hypervolemic examination. Currently 118.  Unclear etiology Will order chest CT to evaluate rule out lung mass Will prescribe Tolvaptan 15mg  Will monitor UOP and Na levels       LOS: 6   6/15/20223:01 PM

## 2021-01-14 NOTE — Progress Notes (Signed)
PT Cancellation Note  Patient Details Name: Kirk Sanchez MRN: [id number] DOB: Feb 18, 1925   Cancelled Treatment:     Treatment held due to critically low Sodium levels. Will check back next availability.   Jannet Askew 01/14/2021, 5:16 PM

## 2021-01-14 NOTE — Progress Notes (Addendum)
[facility]  - [place] [facility]   PATIENT NAME: Kirk Sanchez    MR#:  [id number]  DATE OF BIRTH:  1925-06-04  SUBJECTIVE:  patient very hard of hearing. Denies any shortness of breath or chest pain. Overall improving. Has continues to have leg swelling. Tolerating PO diet.  REVIEW OF SYSTEMS:   Review of Systems  Constitutional:  Negative for chills, fever and weight loss.  HENT:  Positive for hearing loss. Negative for ear discharge, ear pain and nosebleeds.   Eyes:  Negative for blurred vision, pain and discharge.  Respiratory:  Negative for sputum production, shortness of breath, wheezing and stridor.   Cardiovascular:  Positive for leg swelling. Negative for chest pain, palpitations, orthopnea and PND.  Gastrointestinal:  Negative for abdominal pain, diarrhea, nausea and vomiting.  Genitourinary:  Negative for frequency and urgency.  Musculoskeletal:  Negative for back pain and joint pain.  Neurological:  Negative for sensory change, speech change, focal weakness and weakness.  Psychiatric/Behavioral:  Negative for depression and hallucinations. The patient is not nervous/anxious.   Tolerating Diet:yes Tolerating PT: HHPT  DRUG ALLERGIES:  No Known Allergies  LABORATORY PANEL:  CBC Recent Labs  Lab 01/14/21 0719  WBC 7.8  HGB 12.6*  HCT 37.8*  PLT 272    Chemistries  Recent Labs  Lab 01/14/21 0719 01/14/21 1258  NA 118* 117*  K 4.5 4.6  CL 79* 78*  CO2 28 30  GLUCOSE 98 111*  BUN 21 21  CREATININE 0.78 0.95  CALCIUM 8.1* 8.2*  MG 1.8  --   AST  --  62*  ALT  --  50*  ALKPHOS  --  81  BILITOT  --  1.3*   Cardiac Enzymes No results for input(s): TROPONINI in the last 168 hours. RADIOLOGY:  No results found.  VITALS:  Blood pressure (!) 127/96, pulse 74, temperature 97.9 F (36.6 C), temperature source Oral, resp. rate (!) 21, height 5\' 7"  (1.702 m), weight 84.5 kg, SpO2 98 %.  PHYSICAL EXAMINATION:   Physical  Exam  GENERAL:  87 y.o.-year-old patient lying in the bed with no acute distress.  HEENT: Head atraumatic, normocephalic. Oropharynx and nasopharynx clear. HOH+ LUNGS: Normal breath sounds bilaterally, no wheezing, rales, rhonchi. No use of accessory muscles of respiration.  CARDIOVASCULAR: S1, S2 normal. No murmurs, rubs, or gallops.  ABDOMEN: Soft, nontender, nondistended. Bowel sounds present. No organomegaly or mass.  EXTREMITIES: No cyanosis, clubbing  +++ edema b/l.    NEUROLOGIC: Cranial nerves II through XII are intact. No focal Motor or sensory deficits b/l.   PSYCHIATRIC:  patient is alert and oriented x 2 SKIN: No obvious rash, lesion, or ulcer.   LABORATORY PANEL:  CBC Recent Labs  Lab 01/14/21 0719  WBC 7.8  HGB 12.6*  HCT 37.8*  PLT 272    Chemistries  Recent Labs  Lab 01/14/21 0719 01/14/21 1258  NA 118* 117*  K 4.5 4.6  CL 79* 78*  CO2 28 30  GLUCOSE 98 111*  BUN 21 21  CREATININE 0.78 0.95  CALCIUM 8.1* 8.2*  MG 1.8  --   AST  --  62*  ALT  --  50*  ALKPHOS  --  81  BILITOT  --  1.3*   Cardiac Enzymes No results for input(s): TROPONINI in the last 168 hours. RADIOLOGY:  No results found. ASSESSMENT AND PLAN:  87 y.o. male with history of atrial fibrillation, hyperlipidemia, hypertension who was seen in the ER with  complaints of a fall 2 weeks prior to presentation with right ankle pain.  Also complained of some chest discomfort.  EKG showed A. fib interventricular conduction delay with RVR.  High-sensitivity troponin was minimally elevated.  Chest x-ray shows stable cardiomegaly with no CHF.  Acute on chronic systolic CHF: patient presents with dyspnea, chest discomfort and lower extremity edema.   --TTE with LVEF of 35 to 40% (previously 40-45%), severe concentric LVH, severe RAE and mildly reduced RV SF.  -- BNP 220 but no prior to compare to.   --Chest x-ray with bibasilar opacities.   --Started on IV Lasix-- good diuresis however patient  sodium is trending down. Will stop Lasix today. -- nephrology consultation with Dr. Lourdes Sledge. Plans to start patient on PO Tolvaptan -Appreciate input from cardiology -Monitor fluid and respiratory status, and renal function.   Paroxysmal A. fib with RVR: Heart RVR to 130s but converted to sinus rhythm quickly.  TSH within normal.   --HR in 60s to 70s now.  Not on medication at home. CHAD-2-Vasc score >4. -Continue Toprol-XL to 25 mg daily -No Hx of fall but leaves alone.  Started Eliquis after discussion with son, Kirk Sanchez.   Hyponatremia: Likely hypervolemic.  Na 128>>>124>>122>>118--stopped lasix>>117 -- Urine sodium will be unreliable while on IV Lasix.  TSH and a.m. cortisol within normal.  SIADH? -- Nephrology consultation with Dr. Lourdes Sledge -- DC Lasix --po Tolvapton   Left peroneal vein DVT -Continue Eliquis. -Doubt need for CTA as he is already on Eliquis.   Chest discomfort/elevated troponin: likely demand ischemia from the above.  Resolved. -Continue metoprolol XL and statin   Hypothyroidism: TSH is normal -Continue home synthroid   Normocytic anemia: H&H stable Recent Labs (within last 365 days)          Recent Labs    12/05/20 1411 01/08/21 1550 01/09/21 0418 01/10/21 0518 01/11/21 0508 01/12/21 0644  HGB 11.6* 11.6* 11.7* 11.9* 10.7* 12.0*      Diminished hearing   Generalized weakness -Continue PT/OT-- recommends home health PT   Procedures:none Family communication :son Kirk Sanchez on the phone Consults :cardiology, nephrology CODE STATUS: FUll DVT Prophylaxis :Eliquis Level of care: Med-Surg Status is: Inpatient  Remains inpatient appropriate because:Inpatient level of care appropriate due to severity of illness  Dispo: The patient is from: Home              Anticipated d/c is to: Home              Patient currently is medically stable to d/c.   Difficult to place patient No  TOTAL TIME TAKING CARE OF THIS PATIENT: 35 minutes.  >50% time spent on  counselling and coordination of care  Note: This dictation was prepared with Dragon dictation along with smaller phrase technology. Any transcriptional errors that result from this process are unintentional.  Enedina Finner M.D    [facility]   CC: Primary care physician; [facility]  Procedures: Family communication : Consults : CODE STATUS:  DVT Prophylaxis : Level of care: Med-Surg Status is: Inpatient  Remains inpatient appropriate because:Inpatient level of care appropriate due to severity of illness  Dispo: The patient is from: Home              Anticipated d/c is to: Home              Patient currently is not medically stable to d/c.   Difficult to place patient No  patient continues to have hyponatremia. Nephrology consultation appreciated. Will  continue to monitor and discharge once hyponatremia is at a safe level      TOTAL TIME TAKING CARE OF THIS PATIENT: 35 minutes.  >50% time spent on counselling and coordination of care  Note: This dictation was prepared with Dragon dictation along with smaller phrase technology. Any transcriptional errors that result from this process are unintentional.  Enedina Finner M.D    [facility]   CC: Primary care physician; [facility], [place] Patient ID: Kirk Sanchez, male   DOB: 04/25/1925, 87 y.o.   MRN: [id number]

## 2021-01-15 DIAGNOSIS — E871 Hypo-osmolality and hyponatremia: Secondary | ICD-10-CM | POA: Diagnosis not present

## 2021-01-15 DIAGNOSIS — E039 Hypothyroidism, unspecified: Secondary | ICD-10-CM | POA: Diagnosis not present

## 2021-01-15 DIAGNOSIS — I4891 Unspecified atrial fibrillation: Secondary | ICD-10-CM | POA: Diagnosis not present

## 2021-01-15 DIAGNOSIS — I5023 Acute on chronic systolic (congestive) heart failure: Secondary | ICD-10-CM | POA: Diagnosis not present

## 2021-01-15 LAB — SODIUM, URINE, RANDOM: Sodium, Ur: <10 mmol/L

## 2021-01-15 LAB — SODIUM
Sodium: 117 mmol/L — CL (ref 135–145)
Sodium: 118 mmol/L — CL (ref 135–145)
Sodium: 119 mmol/L — CL (ref 135–145)
Sodium: 123 mmol/L — ABNORMAL LOW (ref 135–145)

## 2021-01-15 LAB — OSMOLALITY, URINE: Osmolality, Ur: 118 mOsm/kg — ABNORMAL LOW (ref 300–900)

## 2021-01-15 MED ORDER — SODIUM CHLORIDE 3 % IV SOLN
INTRAVENOUS | Status: DC
  Filled 2021-01-15 (×3): qty 500

## 2021-01-15 MED ORDER — HALOPERIDOL LACTATE 5 MG/ML IJ SOLN: 3.0000 mg | Freq: Once | INTRAMUSCULAR | Status: DC

## 2021-01-15 NOTE — Consult Note (Signed)
MEDICATION RELATED CONSULT NOTE - INITIAL   Pharmacy Consult for 3% NaCl monitoring Indication: hyponatremia   No Known Allergies  Patient Measurements: Height: 5\' 7"  (170.2 cm) Weight: 84.5 kg (186 lb 3.2 oz) IBW/kg (Calculated) : 66.1  Vital Signs: Temp: 97.5 F (36.4 C) (06/16 1219) BP: 109/75 (06/16 1219) Pulse Rate: 67 (06/16 1219) Intake/Output from previous day: 06/15 0701 - 06/16 0700 In: 897.5 [P.O.:860; IV Piggyback:37.5] Out: 200 [Urine:200] Intake/Output from this shift: Total I/O In: 600 [P.O.:600] Out: -   Labs: Recent Labs    01/13/21 0605 01/14/21 0719 01/14/21 1258  WBC  --  7.8  --   HGB  --  12.6*  --   HCT  --  37.8*  --   PLT  --  272  --   CREATININE 0.79 0.78 0.95  MG 1.8 1.8  --   PHOS 2.3* 2.7  --   ALBUMIN 2.7* 3.3* 3.4*  PROT  --   --  6.3*  AST  --   --  62*  ALT  --   --  50*  ALKPHOS  --   --  81  BILITOT  --   --  1.3*  BILIDIR  --   --  0.3*  IBILI  --   --  1.0*   Estimated Creatinine Clearance: 50.5 mL/min (by C-G formula based on SCr of 0.95 mg/dL).    Medical History: Past Medical History:  Diagnosis Date   Atrial fibrillation (HCC)    Hyperlipidemia    Hypertension    Kidney stone    Presbycusis of both ears 03/06/2016   Thyroid disease     Medications:  Tolvaptan 15 mg x 1 given 6/15 @ 1530 3% NaCl infusion started 6/16 @ 1607 @ 30 mL/hr  Assessment: 87 y.o.  male with hyponatremia, hypervolemic. Tolvaptan 15 mg x 1 given 6/15 ,without desired respose. Withholind further tolvaptan due to concern for liver cirrhosis. Initiated on 3% NaCl infusion per nephrology  6/15 1530 Tolvaptan 15 mg x 1  6/16 1300 Na = 118 6/16 1600 3% Infusion started @ 30 mL/hr 6/16 1700 Na = 119  Goal of Therapy:  Correction of hyponatremia Avoid Na rise > 4 mEq over 2 hours or > 6 mEq over 4 hours Avoid Na increase > 8 mEq in 8 hours or > 12 mEq in 24 hours  Plan:  Continue 3% NaCl @ 30 mL/hr per nephrology Continue to  monitor Na closely    7/16, PharmD, BCPS Clinical Pharmacist   01/15/2021,6:01 PM

## 2021-01-15 NOTE — Progress Notes (Signed)
[facility]  - [place] at [facility]   PATIENT NAME: Kirk Sanchez    MR#:  [id number]  DATE OF BIRTH:  31-Jul-1925  SUBJECTIVE:  patient very hard of hearing. Denies any shortness of breath or chest pain. Overall improving. Has continues to have leg swelling. Tolerating PO diet.  REVIEW OF SYSTEMS:   Review of Systems  Constitutional:  Negative for chills, fever and weight loss.  HENT:  Positive for hearing loss. Negative for ear discharge, ear pain and nosebleeds.   Eyes:  Negative for blurred vision, pain and discharge.  Respiratory:  Negative for sputum production, shortness of breath, wheezing and stridor.   Cardiovascular:  Positive for leg swelling. Negative for chest pain, palpitations, orthopnea and PND.  Gastrointestinal:  Negative for abdominal pain, diarrhea, nausea and vomiting.  Genitourinary:  Negative for frequency and urgency.  Musculoskeletal:  Negative for back pain and joint pain.  Neurological:  Negative for sensory change, speech change, focal weakness and weakness.  Psychiatric/Behavioral:  Negative for depression and hallucinations. The patient is not nervous/anxious.   Tolerating Diet:yes Tolerating PT: HHPT  DRUG ALLERGIES:  No Known Allergies  LABORATORY PANEL:  CBC Recent Labs  Lab 01/14/21 0719  WBC 7.8  HGB 12.6*  HCT 37.8*  PLT 272     Chemistries  Recent Labs  Lab 01/14/21 0719 01/14/21 1258 01/14/21 2252 01/15/21 1317  NA 118* 117*   < > 118*  K 4.5 4.6  --   --   CL 79* 78*  --   --   CO2 28 30  --   --   GLUCOSE 98 111*  --   --   BUN 21 21  --   --   CREATININE 0.78 0.95  --   --   CALCIUM 8.1* 8.2*  --   --   MG 1.8  --   --   --   AST  --  62*  --   --   ALT  --  50*  --   --   ALKPHOS  --  81  --   --   BILITOT  --  1.3*  --   --    < > = values in this interval not displayed.    Cardiac Enzymes No results for input(s): TROPONINI in the last 168 hours. RADIOLOGY:  CT CHEST W CONTRAST  Result  Date: 01/14/2021 CLINICAL DATA:  Hyponatremia, paroxysmal atrial fibrillation, hypertension, possible lung mass, abnormal chest radiograph EXAM: CT CHEST WITH CONTRAST TECHNIQUE: Multidetector CT imaging of the chest was performed during intravenous contrast administration. Sagittal and coronal MPR images reconstructed from axial data set. CONTRAST:  5mL OMNIPAQUE IOHEXOL 300 MG/ML  SOLN IV COMPARISON:  04/08/2016 Correlation: Chest radiograph 01/08/2021 FINDINGS: Cardiovascular: Enlargement of cardiac chambers. No pericardial effusion. Extensive atherosclerotic calcifications aorta, proximal great vessels, and coronary arteries. Aorta normal caliber. Pulmonary arteries well opacified without evidence of pulmonary emboli on a non dedicated exam. Mediastinum/Nodes: Few scattered normal mediastinal lymph nodes. No thoracic adenopathy. Base of cervical region normal appearance. Esophagus unremarkable. Lungs/Pleura: BILATERAL moderate pleural effusions with compressive atelectasis of BILATERAL lower lobes. Additional subsegmental atelectasis at base of lingula, RIGHT middle lobe, and anterior RIGHT lower lobe. No segmental consolidation, pneumothorax, or definite pulmonary mass identified, though portions of the lower lobes are obscured by atelectasis. Upper Abdomen: Nodular appearance of hepatic contour consistent with cirrhosis. Remaining visualized upper abdomen unremarkable. Musculoskeletal: Bones demineralized. IMPRESSION: BILATERAL moderate pleural effusions with compressive atelectasis of  BILATERAL lower lobes. Cirrhotic appearing liver. Extensive atherosclerotic disease changes including coronary arteries. Aortic Atherosclerosis (ICD10-I70.0). Electronically Signed   By: Ulyses Southward M.D.   On: 01/14/2021 16:35    VITALS:  Blood pressure (!) 127/96, pulse 74, temperature 97.9 F (36.6 C), temperature source Oral, resp. rate (!) 21, height 5\' 7"  (1.702 m), weight 84.5 kg, SpO2 98 %.  PHYSICAL EXAMINATION:   GENERAL:  87 y.o.-year-old patient lying in the bed with no acute distress.  HEENT: Head atraumatic, normocephalic. Oropharynx and nasopharynx clear. HOH+ LUNGS: Normal breath sounds bilaterally, no wheezing, rales, rhonchi. No use of accessory muscles of respiration.  CARDIOVASCULAR: S1, S2 normal. No murmurs, rubs, or gallops.  ABDOMEN: Soft, nontender, nondistended. Bowel sounds present. No organomegaly or mass.  EXTREMITIES: ++ edema b/l.    NEUROLOGIC: non focal PSYCHIATRIC:  patient is alert and oriented x 2 SKIN: No obvious rash, lesion, or ulcer.   LABORATORY PANEL:  CBC Recent Labs  Lab 01/14/21 0719  WBC 7.8  HGB 12.6*  HCT 37.8*  PLT 272    Chemistries  Recent Labs  Lab 01/14/21 0719 01/14/21 1258  NA 118* 117*  K 4.5 4.6  CL 79* 78*  CO2 28 30  GLUCOSE 98 111*  BUN 21 21  CREATININE 0.78 0.95  CALCIUM 8.1* 8.2*  MG 1.8  --   AST  --  62*  ALT  --  50*  ALKPHOS  --  81  BILITOT  --  1.3*   Cardiac Enzymes No results for input(s): TROPONINI in the last 168 hours. RADIOLOGY:  No results found. ASSESSMENT AND PLAN:  87 y.o. male with history of atrial fibrillation, hyperlipidemia, hypertension who was seen in the ER with complaints of a fall 2 weeks prior to presentation with right ankle pain.  Also complained of some chest discomfort.  EKG showed A. fib interventricular conduction delay with RVR.  High-sensitivity troponin was minimally elevated.  Chest x-ray shows stable cardiomegaly with no CHF.  Acute on chronic systolic CHF:  --patient presents with dyspnea, chest discomfort and lower extremity edema.   --TTE with LVEF of 35 to 40% (previously 40-45%), severe concentric LVH, severe RAE and mildly reduced RV SF.  -- BNP 220 --Chest x-ray with bibasilar opacities.   --6/15--pt was on IV Lasix-- good diuresis however patient sodium is trending down. Will stop Lasix today. --Nephrology consultation with Dr. 83. PO Tolvaptan x 1 -Appreciate input  from cardiology -6/16--Na 115--117 -- CT chest shows no pulmonary mass. Shows changes of cirrhosis. Tolvaptan CI. Patient to be present hypertonic saline per nephrology recommendation.   Paroxysmal A. fib with RVR: Heart RVR to 130s but converted to sinus rhythm quickly.  TSH within normal.   --HR in 60s to 70s now.  Not on medication at home. CHAD-2-Vasc score >4. -Continue Toprol-XL to 25 mg daily -No Hx of fall but leaves alone.  Started Eliquis after discussion with son, 09-14-1974.   Hyponatremia: Likely hypervolemic.  Na 128>>>124>>122>>118--stopped lasix>>117 -- Urine sodium will be unreliable while on IV Lasix.  TSH and a.m. cortisol within normal.  SIADH? -- Nephrology consultation with Dr. [name] -- DC Lasix --po Tolvapton   Left peroneal vein DVT -Continue Eliquis. -Doubt need for CTA as he is already on Eliquis.   Chest discomfort/elevated troponin: likely demand ischemia from the above.  Resolved. -Continue metoprolol XL and statin   Hypothyroidism: TSH is normal -Continue home synthroid   Normocytic anemia: H&H stable   Diminished hearing  Generalized weakness -Continue PT/OT-- recommends home health PT   Procedures:none Family communication : unable to reach son [name]. Discussed at length with daughter-in-law Adynn Caseres on 6/16 Consults :cardiology, nephrology CODE STATUS: Full DVT Prophylaxis :Eliquis Level of care: Med-Surg Status is: Inpatient  Remains inpatient appropriate because:Inpatient level of care appropriate due to severity of illness  Dispo: The patient is from: Home              Anticipated d/c is to: Home              Patient currently is medically not stable to d/c.   Difficult to place patient No  patient continues to remain hyponatremic. Started on 3% hypertonic saline per nephrology recommendation.  TOTAL TIME TAKING CARE OF THIS PATIENT: 35 minutes.  >50% time spent on counselling and coordination of care  Note: This dictation  was prepared with Dragon dictation along with smaller phrase technology. Any transcriptional errors that result from this process are unintentional.  Enedina Finner M.D    [facility]   CC: Primary care physician; [facility]  Procedures: Family communication : Consults : CODE STATUS:  DVT Prophylaxis : Level of care: Stepdown Status is: Inpatient  Remains inpatient appropriate because:Inpatient level of care appropriate due to severity of illness  Dispo: The patient is from: Home              Anticipated d/c is to: Home              Patient currently is not medically stable to d/c.   Difficult to place patient No  patient continues to have hyponatremia. Nephrology consultation appreciated. Will continue to monitor and discharge once hyponatremia is at a safe level      TOTAL TIME TAKING CARE OF THIS PATIENT: 35 minutes.  >50% time spent on counselling and coordination of care  Note: This dictation was prepared with Dragon dictation along with smaller phrase technology. Any transcriptional errors that result from this process are unintentional.  Enedina Finner M.D    [facility]   CC: Primary care physician; [facility], [place] Patient ID: Kirk Sanchez, male   DOB: [birth year], 87 y.o.   MRN: [id number]

## 2021-01-15 NOTE — Progress Notes (Signed)
Mobility Specialist - Progress Note   01/15/21 1100  Mobility  Activity Contraindicated/medical hold  Mobility performed by Mobility specialist    Per chart review, pt with low Sodium of 117 this date, contraindicating mobility. Will attempt session another date/time as appropriate.    Filiberto Pinks Mobility Specialist 01/15/21, 11:44 AM

## 2021-01-15 NOTE — Progress Notes (Addendum)
Patient ID: Kirk Sanchez, male   DOB: 1925-03-17, 87 y.o.   MRN: [id number]  per patient's RN no ICU beds available at present. Patient requires 3% hypertonic saline for severe hyponatremia. He is hemodynamically stable. Mentation is clear. Very hard on hearing. I have discussed with charge nurse Judeth Cornfield on 2a to see if he can get started on the hypertonic saline drip on the progressive cardiac unit flooor.  charge nurse will get back with me. Dr. Cherylann Ratel is aware  3:30 pm-- discussed with Scherrie Bateman director 2a . Since patient is hemodynamically stable mentation is clear will keep him on PCU for 3% hypertonic saline drip. ICU charge nurse aware. I will cancel transfer order. Pharmacy aware of patient being started on hypertonic saline drip.

## 2021-01-15 NOTE — Progress Notes (Signed)
PT Cancellation Note  Patient Details Name: Kirk Sanchez MRN: [id number] DOB: 07-31-1925   Cancelled Treatment:    Reason Eval/Treat Not Completed: Medical issues which prohibited therapy  Sodium remains outside of PT Guidelines.  Will continue as appropriate at a later time/date.   Danielle Dess 01/15/2021, 1:27 PM

## 2021-01-15 NOTE — Progress Notes (Signed)
Patient's sodium 115. Notified Dr. Arville Care, no new orders received. Will continue to monitor.

## 2021-01-15 NOTE — Progress Notes (Signed)
[facility]  ROUNDING NOTE   Subjective:   Kirk Sanchez is a 87 y.o. male with previous medical history of HTN, hyperlipidemia, kidney stone, A-fib, and thyroid disease. He was admitted for shortness of breath.   Patient seen resting in bed States he's tired this morning Reports good appetite    Objective:  Vital signs in last 24 hours:  Temp:  [97.5 F (36.4 C)-98.3 F (36.8 C)] 97.5 F (36.4 C) (06/16 1219) Pulse Rate:  [62-86] 67 (06/16 1219) Resp:  [17-20] 17 (06/16 1219) BP: (94-109)/(54-77) 109/75 (06/16 1219) SpO2:  [94 %-97 %] 96 % (06/16 1219)  Weight change:  Filed Weights   01/12/21 0500 01/13/21 0425 01/14/21 0130  Weight: 84 kg 83.9 kg 84.5 kg    Intake/Output: I/O last 3 completed shifts: In: 1097.5 [P.O.:1060; IV Piggyback:37.5] Out: 200 [Urine:200]   Intake/Output this shift:  Total I/O In: 600 [P.O.:600] Out: -   Physical Exam: General: NAD, resting in bed  Head: Normocephalic, atraumatic. Moist oral mucosal membranes  Eyes: Anicteric  Lungs:  Clear to auscultation, normal effort  Heart: Regular rate and rhythm  Abdomen:  Soft, nontender,   Extremities:  1+ peripheral edema.  Neurologic: Nonfocal, moving all four extremities  Skin: No lesions       Basic Metabolic Panel: Recent Labs  Lab 01/10/21 0518 01/11/21 0508 01/12/21 0507 01/13/21 6010 01/14/21 0719 01/14/21 1258 01/14/21 2252 01/15/21 0447 01/15/21 1317  NA 130* 127*  128* 124* 122* 118* 117* 115* 117* 118*  K 4.1 3.6  3.6 4.3 3.4* 4.5 4.6  --   --   --   CL 92* 90*  92* 88* 83* 79* 78*  --   --   --   CO2 30 29  28 25 28 28 30   --   --   --   GLUCOSE 97 99  101* 111* 92 98 111*  --   --   --   BUN 19 24*  25* 29* 24* 21 21  --   --   --   CREATININE 1.02 0.86  1.01 0.81 0.79 0.78 0.95  --   --   --   CALCIUM 8.6* 8.1*  8.2* 8.2* 7.8* 8.1* 8.2*  --   --   --   MG 1.7 2.0 2.1 1.8 1.8  --   --   --   --   PHOS 3.9 3.3 3.3 2.3* 2.7  --   --   --    --     Liver Function Tests: Recent Labs  Lab 01/08/21 1550 01/09/21 0418 01/10/21 0518 01/11/21 0508 01/12/21 0507 01/13/21 0605 01/14/21 0719 01/14/21 1258  AST 25 27  --   --   --   --   --  62*  ALT 12 14  --   --   --   --   --  50*  ALKPHOS 60 61  --   --   --   --   --  81  BILITOT 0.9 1.2  --   --   --   --   --  1.3*  PROT 6.3* 6.3*  --   --   --   --   --  6.3*  ALBUMIN 3.5 3.4*   < > 2.8* 3.0* 2.7* 3.3* 3.4*   < > = values in this interval not displayed.   No results for input(s): LIPASE, AMYLASE in the last 168 hours. No results for input(s):  AMMONIA in the last 168 hours.  CBC: Recent Labs  Lab 01/08/21 1550 01/09/21 0418 01/10/21 0518 01/11/21 0508 01/12/21 0644 01/14/21 0719  WBC 7.6 9.1 7.7 6.9  --  7.8  NEUTROABS 5.7  --   --   --   --   --   HGB 11.6* 11.7* 11.9* 10.7* 12.0* 12.6*  HCT 36.3* 37.1* 37.1* 32.6* 37.0* 37.8*  MCV 89.9 89.6 87.5 87.6  --  84.0  PLT 204 218 226 213  --  272    Cardiac Enzymes: No results for input(s): CKTOTAL, CKMB, CKMBINDEX, TROPONINI in the last 168 hours.  BNP: Invalid input(s): POCBNP  CBG: Recent Labs  Lab 01/12/21 2007 01/12/21 2326  GLUCAP 137* 102*    Microbiology: Results for orders placed or performed during the hospital encounter of 01/08/21  Resp Panel by RT-PCR (Flu A&B, Covid) Nasopharyngeal Swab     Status: None   Collection Time: 01/08/21  4:03 PM   Specimen: Nasopharyngeal Swab; Nasopharyngeal(NP) swabs in vial transport medium  Result Value Ref Range Status   SARS Coronavirus 2 by RT PCR NEGATIVE NEGATIVE Final    Comment: (NOTE) SARS-CoV-2 target nucleic acids are NOT DETECTED.  The SARS-CoV-2 RNA is generally detectable in upper respiratory specimens during the acute phase of infection. The lowest concentration of SARS-CoV-2 viral copies this assay can detect is 138 copies/mL. A negative result does not preclude SARS-Cov-2 infection and should not be used as the sole basis for  treatment or other patient management decisions. A negative result may occur with  improper specimen collection/handling, submission of specimen other than nasopharyngeal swab, presence of viral mutation(s) within the areas targeted by this assay, and inadequate number of viral copies(<138 copies/mL). A negative result must be combined with clinical observations, patient history, and epidemiological information. The expected result is Negative.  Fact Sheet for Patients:  BloggerCourse.com  Fact Sheet for Healthcare Providers:  SeriousBroker.it  This test is no t yet approved or cleared by the [place] FDA and  has been authorized for detection and/or diagnosis of SARS-CoV-2 by FDA under an Emergency Use Authorization (EUA). This EUA will remain  in effect (meaning this test can be used) for the duration of the COVID-19 declaration under Section 564(b)(1) of the Act, 21 U.S.C.section 360bbb-3(b)(1), unless the authorization is terminated  or revoked sooner.       Influenza A by PCR NEGATIVE NEGATIVE Final   Influenza B by PCR NEGATIVE NEGATIVE Final    Comment: (NOTE) The Xpert Xpress SARS-CoV-2/FLU/RSV plus assay is intended as an aid in the diagnosis of influenza from Nasopharyngeal swab specimens and should not be used as a sole basis for treatment. Nasal washings and aspirates are unacceptable for Xpert Xpress SARS-CoV-2/FLU/RSV testing.  Fact Sheet for Patients: BloggerCourse.com  Fact Sheet for Healthcare Providers: SeriousBroker.it  This test is not yet approved or cleared by the [place] FDA and has been authorized for detection and/or diagnosis of SARS-CoV-2 by FDA under an Emergency Use Authorization (EUA). This EUA will remain in effect (meaning this test can be used) for the duration of the COVID-19 declaration under Section 564(b)(1) of the Act, 21  U.S.C. section 360bbb-3(b)(1), unless the authorization is terminated or revoked.  Performed at [facility], [address] [place], [place] [address]     Coagulation Studies: No results for input(s): LABPROT, INR in the last 72 hours.  Urinalysis: No results for input(s): COLORURINE, LABSPEC, PHURINE, GLUCOSEU, HGBUR, BILIRUBINUR, KETONESUR, PROTEINUR, UROBILINOGEN, NITRITE, LEUKOCYTESUR  in the last 72 hours.  Invalid input(s): APPERANCEUR    Imaging: CT CHEST W CONTRAST  Result Date: 01/14/2021 CLINICAL DATA:  Hyponatremia, paroxysmal atrial fibrillation, hypertension, possible lung mass, abnormal chest radiograph EXAM: CT CHEST WITH CONTRAST TECHNIQUE: Multidetector CT imaging of the chest was performed during intravenous contrast administration. Sagittal and coronal MPR images reconstructed from axial data set. CONTRAST:  81mL OMNIPAQUE IOHEXOL 300 MG/ML  SOLN IV COMPARISON:  04/08/2016 Correlation: Chest radiograph 01/08/2021 FINDINGS: Cardiovascular: Enlargement of cardiac chambers. No pericardial effusion. Extensive atherosclerotic calcifications aorta, proximal great vessels, and coronary arteries. Aorta normal caliber. Pulmonary arteries well opacified without evidence of pulmonary emboli on a non dedicated exam. Mediastinum/Nodes: Few scattered normal mediastinal lymph nodes. No thoracic adenopathy. Base of cervical region normal appearance. Esophagus unremarkable. Lungs/Pleura: BILATERAL moderate pleural effusions with compressive atelectasis of BILATERAL lower lobes. Additional subsegmental atelectasis at base of lingula, RIGHT middle lobe, and anterior RIGHT lower lobe. No segmental consolidation, pneumothorax, or definite pulmonary mass identified, though portions of the lower lobes are obscured by atelectasis. Upper Abdomen: Nodular appearance of hepatic contour consistent with cirrhosis. Remaining visualized upper abdomen unremarkable. Musculoskeletal: Bones  demineralized. IMPRESSION: BILATERAL moderate pleural effusions with compressive atelectasis of BILATERAL lower lobes. Cirrhotic appearing liver. Extensive atherosclerotic disease changes including coronary arteries. Aortic Atherosclerosis (ICD10-I70.0). Electronically Signed   By: Ulyses Southward M.D.   On: 01/14/2021 16:35     Medications:    sodium chloride (hypertonic)      apixaban  5 mg Oral BID   levothyroxine  25 mcg Oral Q0600   metoprolol succinate  25 mg Oral q1800   pantoprazole  40 mg Oral Daily   simvastatin  40 mg Oral q1800   acetaminophen **OR** acetaminophen, ondansetron **OR** ondansetron (ZOFRAN) IV  Assessment/ Plan:  Mr. HOLSTON OYAMA is a 87 y.o.  male with previous medical history of HTN, hyperlipidemia, kidney stone, A-fib, and thyroid disease. He was admitted for shortness of breath.   Hyponatremia:hypervolemic on exam unclear elogy CT chest shows bilateral compressed pleural effusions and atelectasis. Also a cirrhotic appearing liver.  Tovaptan given yesterday, desired result not achieved Not an option to repeat Tovaptan with possible liver cirrhosis Will order 3% saline at 30 ml/hr and monitor sodium closely    LOS: 7   6/16/20223:40 PM

## 2021-01-15 NOTE — Progress Notes (Signed)
Occupational Therapy Treatment Patient Details Name: Kirk Sanchez MRN: [id number] DOB: 12/04/1924 Today's Date: 01/15/2021    History of present illness Kirk Sanchez is a 87 y.o. male seen in ed with complaints of  fall 2 weeks ago and hurt his rt ankle and has been progressively dwelling and now left ankle is swollen as well. Pt also reports having midsternal chest discomfort. Pt came via ems from urgent care. EKG shows a.fib rvr and pt has h/o PAF, pt is not on any NOAC.Edema is symmetric 2 + both edema. Pt has past medical history of PAF, Hyperlipidemia, HTN, Kidney stone, CHF, Hypothyroidism hearing impaired. Admitted for shortness of breath (differentials heart failure exacerbation, PE, GERD variant asthma). Positive troponin which will be trended. lives at home. Per family patient is typically very active but has been laying in bed more for past few days.   OT comments  Pt seen for OT tx this date to f/u re: safety with ADLs/ADL mobility. OT engages pt in STS trials x3 with CGA with progress to SBA with RW. Pt demos improved control with cues for sequence/hand placement (benefits from mostly tactile/visual cues as he is very HOH). Pt tolerates standing sink-side with SBA to complete oral care with SETUP. Pt left seated EOB with his son presenting at end of session. Pt tolerates well. All needs met and in reach. RN updated on pt's performance. As pt has progressed with ADLs/ADL mobility and has good social support, Updated d/c recommendation to home health.    Follow Up Recommendations  Home health OT;Supervision/Assistance - 24 hour    Equipment Recommendations  3 in 1 bedside commode    Recommendations for Other Services      Precautions / Restrictions Precautions Precautions: Fall Restrictions Weight Bearing Restrictions: No       Mobility Bed Mobility               General bed mobility comments: pt sitting EOB before and after OT session    Transfers Overall  transfer level: Needs assistance Equipment used: Rolling walker (2 wheeled) Transfers: Sit to/from Stand Sit to Stand: Supervision         General transfer comment: demos improved control this date with only minimal cues for sequence of hand placement required. Pt benfits from visual cues as he is very HOH    Balance Overall balance assessment: Needs assistance Sitting-balance support: Feet supported Sitting balance-Leahy Scale: Good     Standing balance support: No upper extremity supported;During functional activity Standing balance-Leahy Scale: Fair Standing balance comment: UE support on RW                           ADL either performed or assessed with clinical judgement   ADL Overall ADL's : Needs assistance/impaired     Grooming: Supervision/safety;Standing;Oral care Grooming Details (indicate cue type and reason): sink-side with cues to intiate             Lower Body Dressing: Minimal assistance;Sitting/lateral leans Lower Body Dressing Details (indicate cue type and reason): with cues for cross-leg technique to complete donning/doffing socks. Pt demos improved tolerance for LE ROM as it pertains to ADLs.                     Vision Baseline Vision/History: Wears glasses Wears Glasses: At all times Patient Visual Report: No change from baseline     Perception     Praxis  Cognition Arousal/Alertness: Awake/alert Behavior During Therapy: WFL for tasks assessed/performed Overall Cognitive Status: Within Functional Limits for tasks assessed                                 General Comments: HOH        Exercises Other Exercises Other Exercises: OT engages pt in STS x3 trials with CGA and progress to SUPV only (on last 2 trials) with cues for safety awreness/hand palcement/sequence. Pt with moderate reception/carryover.   Shoulder Instructions       General Comments      Pertinent Vitals/ Pain       Pain  Assessment: No/denies pain  Home Living                                          Prior Functioning/Environment              Frequency  Min 2X/week        Progress Toward Goals  OT Goals(current goals can now be found in the care plan section)  Progress towards OT goals: Progressing toward goals  Acute Rehab OT Goals Patient Stated Goal: to go home OT Goal Formulation: With patient Time For Goal Achievement: 01/23/21 Potential to Achieve Goals: Good  Plan Discharge plan needs to be updated;Frequency needs to be updated    Co-evaluation                 AM-PAC OT "6 Clicks" Daily Activity     Outcome Measure   Help from another person eating meals?: None Help from another person taking care of personal grooming?: A Little Help from another person toileting, which includes using toliet, bedpan, or urinal?: A Little Help from another person bathing (including washing, rinsing, drying)?: A Little Help from another person to put on and taking off regular upper body clothing?: A Little Help from another person to put on and taking off regular lower body clothing?: A Lot 6 Click Score: 18    End of Session Equipment Utilized During Treatment: Rolling walker;Oxygen  OT Visit Diagnosis: Unsteadiness on feet (R26.81);Repeated falls (R29.6)   Activity Tolerance Patient tolerated treatment well   Patient Left in bed;with call bell/phone within reach;with bed alarm set   Nurse Communication Mobility status        Time: 1191-4782 OT Time Calculation (min): 9 min  Charges: OT General Charges $OT Visit: 1 Visit OT Treatments $Therapeutic Activity: 8-22 mins  Gerrianne Scale, [place], OTR/L ascom [phone] 01/15/21, 6:25 PM

## 2021-01-15 NOTE — Care Management Important Message (Signed)
Important Message  Patient Details  Name: Kirk Sanchez MRN: [id number] Date of Birth: [birth year]   Medicare Important Message Given:  Yes     [name] 01/15/2021, 1:38 PM

## 2021-01-16 DIAGNOSIS — R0602 Shortness of breath: Secondary | ICD-10-CM | POA: Diagnosis not present

## 2021-01-16 LAB — OSMOLALITY: Osmolality: 255 mOsm/kg — ABNORMAL LOW (ref 275–295)

## 2021-01-16 LAB — SODIUM: Sodium: 130 mmol/L — ABNORMAL LOW (ref 135–145)

## 2021-01-16 LAB — BASIC METABOLIC PANEL
Anion gap: 6 (ref 5–15)
BUN: 18 mg/dL (ref 8–23)
CO2: 30 mmol/L (ref 22–32)
Calcium: 8.2 mg/dL — ABNORMAL LOW (ref 8.9–10.3)
Chloride: 90 mmol/L — ABNORMAL LOW (ref 98–111)
Creatinine, Ser: 0.75 mg/dL (ref 0.61–1.24)
GFR, Estimated: >60 mL/min (ref >60)
Glucose, Bld: 105 mg/dL — ABNORMAL HIGH (ref 70–99)
Potassium: 4.3 mmol/L (ref 3.5–5.1)
Sodium: 126 mmol/L — ABNORMAL LOW (ref 135–145)

## 2021-01-16 MED ORDER — APIXABAN 5 MG PO TABS: 5.0000 mg | ORAL_TABLET | Freq: Two times a day (BID) | ORAL | 1 refills | Status: DC

## 2021-01-16 MED ORDER — METOPROLOL SUCCINATE ER 25 MG PO TB24: 25.0000 mg | ORAL_TABLET | Freq: Every day | ORAL | 1 refills | Status: DC

## 2021-01-16 NOTE — Consult Note (Signed)
MEDICATION RELATED CONSULT NOTE - INITIAL   Pharmacy Consult for 3% NaCl monitoring Indication: hyponatremia   No Known Allergies  Patient Measurements: Height: 5\' 7"  (170.2 cm) Weight: 84.5 kg (186 lb 3.2 oz) IBW/kg (Calculated) : 66.1  Vital Signs: Temp: 97.6 F (36.4 C) (06/16 1918) BP: 115/97 (06/16 1918) Pulse Rate: 71 (06/16 1918) Intake/Output from previous day: 06/16 0701 - 06/17 0700 In: 840 [P.O.:840] Out: 1275 [Urine:1275] Intake/Output from this shift: Total I/O In: -  Out: 1275 [Urine:1275]  Labs: Recent Labs    01/13/21 0605 01/14/21 0719 01/14/21 1258 01/16/21 0105  WBC  --  7.8  --   --   HGB  --  12.6*  --   --   HCT  --  37.8*  --   --   PLT  --  272  --   --   CREATININE 0.79 0.78 0.95 0.75  MG 1.8 1.8  --   --   PHOS 2.3* 2.7  --   --   ALBUMIN 2.7* 3.3* 3.4*  --   PROT  --   --  6.3*  --   AST  --   --  62*  --   ALT  --   --  50*  --   ALKPHOS  --   --  81  --   BILITOT  --   --  1.3*  --   BILIDIR  --   --  0.3*  --   IBILI  --   --  1.0*  --     Estimated Creatinine Clearance: 60 mL/min (by C-G formula based on SCr of 0.75 mg/dL).    Medical History: Past Medical History:  Diagnosis Date   Atrial fibrillation (HCC)    Hyperlipidemia    Hypertension    Kidney stone    Presbycusis of both ears 03/06/2016   Thyroid disease     Medications:  Tolvaptan 15 mg x 1 given 6/15 @ 1530 3% NaCl infusion started 6/16 @ 1607 @ 30 mL/hr  Assessment: 87 y.o.  male with hyponatremia, hypervolemic. Tolvaptan 15 mg x 1 given 6/15 ,without desired respose. Withholind further tolvaptan due to concern for liver cirrhosis. Initiated on 3% NaCl infusion per nephrology  6/15 1530 Tolvaptan 15 mg x 1  6/16 1300 Na = 118 6/16 1600 3% Infusion started @ 30 mL/hr 6/16 1700 Na = 119 6/16 2131 Na = 123 6/17 0105 Na = 126  Goal of Therapy:  Correction of hyponatremia Avoid Na rise > 4 mEq over 2 hours or > 6 mEq over 4 hours Avoid Na increase >  8 mEq in 8 hours or > 12 mEq in 24 hours  Plan:  Continue 3% NaCl @ 30 mL/hr per nephrology Continue to monitor Na closely    7/17, PharmD, [facility] 01/16/2021 2:03 AM

## 2021-01-16 NOTE — Progress Notes (Signed)
Physical Therapy Treatment Patient Details Name: Kirk Sanchez MRN: [id number] DOB: 1925-01-25 Today's Date: 01/16/2021    History of Present Illness Kirk Sanchez is a 87 y.o. male seen in ed with complaints of  fall 2 weeks ago and hurt his rt ankle and has been progressively dwelling and now left ankle is swollen as well. Pt also reports having midsternal chest discomfort. Pt came via ems from urgent care. EKG shows a.fib rvr and pt has h/o PAF, pt is not on any NOAC.Edema is symmetric 2 + both edema. Pt has past medical history of PAF, Hyperlipidemia, HTN, Kidney stone, CHF, Hypothyroidism hearing impaired. Admitted for shortness of breath (differentials heart failure exacerbation, PE, GERD variant asthma). Positive troponin which will be trended. lives at home. Per family patient is typically very active but has been laying in bed more for past few days.    PT Comments    Pt ready and in chair.  Dressed for discharge.  He is able to stand and walk lap around nursing unit with RW and min guard/assist.  Some weakness noted but no LOB or buckling.  Recommend +1 assist with mobility upon discharge.   Follow Up Recommendations  Home health PT;Supervision/Assistance - 24 hour     Equipment Recommendations  3in1 (PT);Rolling walker with 5" wheels    Recommendations for Other Services       Precautions / Restrictions Precautions Precautions: Fall Restrictions Weight Bearing Restrictions: No    Mobility  Bed Mobility               General bed mobility comments: in recliner before and after    Transfers Overall transfer level: Needs assistance Equipment used: Rolling walker (2 wheeled) Transfers: Sit to/from Stand Sit to Stand: Min guard            Ambulation/Gait Ambulation/Gait assistance: Min guard Gait Distance (Feet): 160 Feet Assistive device: Rolling walker (2 wheeled) Gait Pattern/deviations: Step-to pattern;Antalgic;Drifts right/left Gait velocity:  decreased       Stairs             Wheelchair Mobility    Modified Rankin (Stroke Patients Only)       Balance Overall balance assessment: Needs assistance Sitting-balance support: Feet supported Sitting balance-Leahy Scale: Good     Standing balance support: No upper extremity supported;During functional activity Standing balance-Leahy Scale: Fair Standing balance comment: UE support on RW                            Cognition Arousal/Alertness: Awake/alert Behavior During Therapy: WFL for tasks assessed/performed Overall Cognitive Status: Within Functional Limits for tasks assessed                                        Exercises      General Comments        Pertinent Vitals/Pain Pain Assessment: No/denies pain    Home Living                      Prior Function            PT Goals (current goals can now be found in the care plan section) Progress towards PT goals: Progressing toward goals    Frequency    Min 2X/week      PT Plan      Co-evaluation  AM-PAC PT "6 Clicks" Mobility   Outcome Measure  Help needed turning from your back to your side while in a flat bed without using bedrails?: A Little Help needed moving from lying on your back to sitting on the side of a flat bed without using bedrails?: A Little Help needed moving to and from a bed to a chair (including a wheelchair)?: A Little Help needed standing up from a chair using your arms (e.g., wheelchair or bedside chair)?: A Little Help needed to walk in hospital room?: A Little Help needed climbing 3-5 steps with a railing? : A Little 6 Click Score: 18    End of Session Equipment Utilized During Treatment: Gait belt Activity Tolerance: Patient tolerated treatment well;Patient limited by fatigue Patient left: in bed;with nursing/sitter in room;with bed alarm set Nurse Communication: Mobility status PT Visit Diagnosis:  Unsteadiness on feet (R26.81);Difficulty in walking, not elsewhere classified (R26.2)     Time: 0626-9485 PT Time Calculation (min) (ACUTE ONLY): 8 min  Charges:  $Gait Training: 8-22 mins                    Danielle Dess, PTA 01/16/21, 10:16 AM , 10:13 AM

## 2021-01-16 NOTE — Progress Notes (Signed)
Patient discharged at this time to home accompanied with son. Patient refuses to take bath or change soiled home clothing. Family aware. NAD upon departure. Son verbalized understanding of discharge instructions. PIV/Tele removed by Clinical research associate.

## 2021-01-16 NOTE — Discharge Instructions (Signed)
Palliative care to follow after discharge

## 2021-01-16 NOTE — Plan of Care (Signed)
  Problem: Education: Goal: Knowledge of General Education information will improve Description: Including pain rating scale, medication(s)/side effects and non-pharmacologic comfort measures Outcome: Progressing   Problem: Clinical Measurements: Goal: Ability to maintain clinical measurements within normal limits will improve Outcome: Progressing   Problem: Safety: Goal: Ability to remain free from injury will improve Outcome: Progressing   

## 2021-01-16 NOTE — TOC Transition Note (Addendum)
Transition of Care [facility]) - CM/SW Discharge Note   Patient Details  Name: Kirk Sanchez MRN: [id number] Date of Birth: 04/04/1925  [facility] of [facility] CM/SW Contact:  Gildardo Griffes, LCSW Phone Number: 01/16/2021, 12:01 PM   Clinical Narrative:     Patient will DC WL:[facility] with home health Anticipated DC date: 01/16/21  Family notified:Son [name]    Per MD patient ready for DC to home with home health services for PT and OT through [facility], [facility] Cower with [facility] has been informed of discharge . RN, patient and patient's family notified of DC. Rolling walker and 3in1 to be delivered to room by [facility] with [facility].   Son to pick up at [facility] entrance at 1:00 pm, RN  made aware to wheel patient down then.   CSW has spoken to Wallis Bamberg with [facility] and requested they follow patient for outpatient palliative.   CSW signing off.  Angeline Slim, LCSW   Final next level of care: Home w Home Health Services Barriers to Discharge: No Barriers Identified   Patient Goals and CMS Choice Patient states their goals for this hospitalization and ongoing recovery are:: to go home CMS Medicare.gov Compare Post Acute Care list provided to:: Patient Represenative (must comment) (son) Choice offered to / list presented to : Adult Children  Discharge Placement                  Name of family member notified: son [name] Patient and family notified of of transfer: 01/16/21  Discharge Plan and Services                DME Arranged: 3-N-1, Walker rolling DME Agency: [facility] Date DME Agency Contacted: 01/16/21 Time DME Agency Contacted: 1200 Representative spoke with at DME Agency: Bjorn Loser HH Arranged: PT, OT HH Agency: [facility] Date HH Agency Contacted: 01/16/21 Time HH Agency Contacted: 1200 Representative spoke with at [facility] Agency: Barbara Cower  Social Determinants of Health (SDOH) Interventions     Readmission Risk Interventions No  flowsheet data found.

## 2021-01-16 NOTE — Progress Notes (Signed)
[facility]   Referral received for outpatient palliative care.  [facility] will follow up with patient in his home.  Wallis Bamberg RN, BSN, CCRN [facility] Liaison

## 2021-01-16 NOTE — Progress Notes (Signed)
[facility]  ROUNDING NOTE   Subjective:   Kirk Sanchez is a 87 y.o. male with previous medical history of HTN, hyperlipidemia, kidney stone, A-fib, and thyroid disease. He was admitted for shortness of breath.   Serum sodium improved to 130. It appears that 3% saline was effective. Patient in good spirits today.    Objective:  Vital signs in last 24 hours:  Temp:  [97.5 F (36.4 C)-98.4 F (36.9 C)] 98.3 F (36.8 C) (06/17 1119) Pulse Rate:  [71-77] 74 (06/17 1119) Resp:  [16-19] 16 (06/17 1119) BP: (109-132)/(70-97) 132/81 (06/17 1119) SpO2:  [95 %-99 %] 99 % (06/17 1119) Weight:  [82.9 kg] 82.9 kg (06/17 0442)  Weight change:  Filed Weights   01/13/21 0425 01/14/21 0130 01/16/21 0442  Weight: 83.9 kg 84.5 kg 82.9 kg    Intake/Output: I/O last 3 completed shifts: In: 1301.2 [P.O.:960; I.V.:341.2] Out: 2725 [Urine:2725]   Intake/Output this shift:  Total I/O In: 714 [P.O.:714] Out: 550 [Urine:550]  Physical Exam: General: NAD, resting in bed  Head: Normocephalic, atraumatic. Moist oral mucosal membranes, hard of hearing  Eyes: Anicteric  Lungs:  Clear to auscultation, normal effort  Heart: Regular rate and rhythm  Abdomen:  Soft, nontender, bowel sounds present  Extremities:  1+ peripheral edema.  Neurologic: Nonfocal, moving all four extremities  Skin: No lesions       Basic Metabolic Panel: Recent Labs  Lab 01/10/21 0518 01/11/21 0508 01/12/21 0507 01/13/21 4431 01/14/21 0719 01/14/21 1258 01/14/21 2252 01/15/21 1317 01/15/21 1703 01/15/21 2131 01/16/21 0105 01/16/21 0752  NA 130* 127*  128* 124* 122* 118* 117*   < > 118* 119* 123* 126* 130*  K 4.1 3.6  3.6 4.3 3.4* 4.5 4.6  --   --   --   --  4.3  --   CL 92* 90*  92* 88* 83* 79* 78*  --   --   --   --  90*  --   CO2 30 29  28 25 28 28 30   --   --   --   --  30  --   GLUCOSE 97 99  101* 111* 92 98 111*  --   --   --   --  105*  --   BUN 19 24*  25* 29* 24* 21 21  --    --   --   --  18  --   CREATININE 1.02 0.86  1.01 0.81 0.79 0.78 0.95  --   --   --   --  0.75  --   CALCIUM 8.6* 8.1*  8.2* 8.2* 7.8* 8.1* 8.2*  --   --   --   --  8.2*  --   MG 1.7 2.0 2.1 1.8 1.8  --   --   --   --   --   --   --   PHOS 3.9 3.3 3.3 2.3* 2.7  --   --   --   --   --   --   --    < > = values in this interval not displayed.     Liver Function Tests: Recent Labs  Lab 01/11/21 0508 01/12/21 0507 01/13/21 0605 01/14/21 0719 01/14/21 1258  AST  --   --   --   --  62*  ALT  --   --   --   --  50*  ALKPHOS  --   --   --   --  81  BILITOT  --   --   --   --  1.3*  PROT  --   --   --   --  6.3*  ALBUMIN 2.8* 3.0* 2.7* 3.3* 3.4*    No results for input(s): LIPASE, AMYLASE in the last 168 hours. No results for input(s): AMMONIA in the last 168 hours.  CBC: Recent Labs  Lab 01/10/21 0518 01/11/21 0508 01/12/21 0644 01/14/21 0719  WBC 7.7 6.9  --  7.8  HGB 11.9* 10.7* 12.0* 12.6*  HCT 37.1* 32.6* 37.0* 37.8*  MCV 87.5 87.6  --  84.0  PLT 226 213  --  272     Cardiac Enzymes: No results for input(s): CKTOTAL, CKMB, CKMBINDEX, TROPONINI in the last 168 hours.  BNP: Invalid input(s): POCBNP  CBG: Recent Labs  Lab 01/12/21 2007 01/12/21 2326  GLUCAP 137* 102*     Microbiology: Results for orders placed or performed during the hospital encounter of 01/08/21  Resp Panel by RT-PCR (Flu A&B, Covid) Nasopharyngeal Swab     Status: None   Collection Time: 01/08/21  4:03 PM   Specimen: Nasopharyngeal Swab; Nasopharyngeal(NP) swabs in vial transport medium  Result Value Ref Range Status   SARS Coronavirus 2 by RT PCR NEGATIVE NEGATIVE Final    Comment: (NOTE) SARS-CoV-2 target nucleic acids are NOT DETECTED.  The SARS-CoV-2 RNA is generally detectable in upper respiratory specimens during the acute phase of infection. The lowest concentration of SARS-CoV-2 viral copies this assay can detect is 138 copies/mL. A negative result does not preclude  SARS-Cov-2 infection and should not be used as the sole basis for treatment or other patient management decisions. A negative result may occur with  improper specimen collection/handling, submission of specimen other than nasopharyngeal swab, presence of viral mutation(s) within the areas targeted by this assay, and inadequate number of viral copies(<138 copies/mL). A negative result must be combined with clinical observations, patient history, and epidemiological information. The expected result is Negative.  Fact Sheet for Patients:  BloggerCourse.com  Fact Sheet for Healthcare Providers:  SeriousBroker.it  This test is no t yet approved or cleared by the [place] FDA and  has been authorized for detection and/or diagnosis of SARS-CoV-2 by FDA under an Emergency Use Authorization (EUA). This EUA will remain  in effect (meaning this test can be used) for the duration of the COVID-19 declaration under Section 564(b)(1) of the Act, 21 U.S.C.section 360bbb-3(b)(1), unless the authorization is terminated  or revoked sooner.       Influenza A by PCR NEGATIVE NEGATIVE Final   Influenza B by PCR NEGATIVE NEGATIVE Final    Comment: (NOTE) The Xpert Xpress SARS-CoV-2/FLU/RSV plus assay is intended as an aid in the diagnosis of influenza from Nasopharyngeal swab specimens and should not be used as a sole basis for treatment. Nasal washings and aspirates are unacceptable for Xpert Xpress SARS-CoV-2/FLU/RSV testing.  Fact Sheet for Patients: BloggerCourse.com  Fact Sheet for Healthcare Providers: SeriousBroker.it  This test is not yet approved or cleared by the [place] FDA and has been authorized for detection and/or diagnosis of SARS-CoV-2 by FDA under an Emergency Use Authorization (EUA). This EUA will remain in effect (meaning this test can be used) for the duration of  the COVID-19 declaration under Section 564(b)(1) of the Act, 21 U.S.C. section 360bbb-3(b)(1), unless the authorization is terminated or revoked.  Performed at [facility], [address], [place], [place] [address]     Coagulation Studies: No results  for input(s): LABPROT, INR in the last 72 hours.  Urinalysis: No results for input(s): COLORURINE, LABSPEC, PHURINE, GLUCOSEU, HGBUR, BILIRUBINUR, KETONESUR, PROTEINUR, UROBILINOGEN, NITRITE, LEUKOCYTESUR in the last 72 hours.  Invalid input(s): APPERANCEUR    Imaging: CT CHEST W CONTRAST  Result Date: 01/14/2021 CLINICAL DATA:  Hyponatremia, paroxysmal atrial fibrillation, hypertension, possible lung mass, abnormal chest radiograph EXAM: CT CHEST WITH CONTRAST TECHNIQUE: Multidetector CT imaging of the chest was performed during intravenous contrast administration. Sagittal and coronal MPR images reconstructed from axial data set. CONTRAST:  56mL OMNIPAQUE IOHEXOL 300 MG/ML  SOLN IV COMPARISON:  04/08/2016 Correlation: Chest radiograph 01/08/2021 FINDINGS: Cardiovascular: Enlargement of cardiac chambers. No pericardial effusion. Extensive atherosclerotic calcifications aorta, proximal great vessels, and coronary arteries. Aorta normal caliber. Pulmonary arteries well opacified without evidence of pulmonary emboli on a non dedicated exam. Mediastinum/Nodes: Few scattered normal mediastinal lymph nodes. No thoracic adenopathy. Base of cervical region normal appearance. Esophagus unremarkable. Lungs/Pleura: BILATERAL moderate pleural effusions with compressive atelectasis of BILATERAL lower lobes. Additional subsegmental atelectasis at base of lingula, RIGHT middle lobe, and anterior RIGHT lower lobe. No segmental consolidation, pneumothorax, or definite pulmonary mass identified, though portions of the lower lobes are obscured by atelectasis. Upper Abdomen: Nodular appearance of hepatic contour consistent with cirrhosis. Remaining  visualized upper abdomen unremarkable. Musculoskeletal: Bones demineralized. IMPRESSION: BILATERAL moderate pleural effusions with compressive atelectasis of BILATERAL lower lobes. Cirrhotic appearing liver. Extensive atherosclerotic disease changes including coronary arteries. Aortic Atherosclerosis (ICD10-I70.0). Electronically Signed   By: Ulyses Southward M.D.   On: 01/14/2021 16:35     Medications:      apixaban  5 mg Oral BID   levothyroxine  25 mcg Oral Q0600   metoprolol succinate  25 mg Oral q1800   pantoprazole  40 mg Oral Daily   simvastatin  40 mg Oral q1800   acetaminophen **OR** acetaminophen, ondansetron **OR** ondansetron (ZOFRAN) IV  Assessment/ Plan:  Kirk Sanchez is a 87 y.o.  male with previous medical history of HTN, hyperlipidemia, kidney stone, A-fib, and thyroid disease. He was admitted for shortness of breath.   Hyponatremia:hypervolemic on exam unclear elogy/Liver cirrhosis. CT chest shows bilateral compressed pleural effusions and atelectasis. Also a cirrhotic appearing liver.  -3% saline was effective.  Serum sodium up to 130.  However suspect that hyponatremia is likely to recur given the potential for cirrhotic appearing liver.  Continue fluid restriction of 1.2 L/day.  Okay for discharge otherwise.    LOS: 8 Kamin Niblack 6/17/20221:34 PM

## 2021-01-16 NOTE — TOC Progression Note (Signed)
Transition of Care [facility]) - Progression Note    Patient Details  Name: Kirk Sanchez MRN: [id number] Date of Birth: 03/19/1925  Transition of Care [facility]) CM/SW Contact  Gildardo Griffes, [place] Phone Number: 01/16/2021, 7:25 AM  Clinical Narrative:       CSW continues to follow for medical readiness to dc.  Patient's son Kirk Sanchez has confirmed that patient will discharge home with him when medically stable. Kirk Sanchez reports he is retired and can provide 24/7 supervision while patient stays with him.   Kirk Sanchez address is [address] [place], [place] [address] where he is agreeable for home health services. Patient was set up with [facility] for PT and OT.    DME needs include rolling walker and 3in1 at time of discharge, will be delivered to room by [facility] with [employer]. Will need DME orders for 3in1 and rolling walker at discharge.     Expected Discharge Plan: Home w Home Health Services Barriers to Discharge: Continued Medical Work up  Expected Discharge Plan and Services Expected Discharge Plan: Home w Home Health Services       Living arrangements for the past 2 months: Single Family Home                 DME Arranged: Walker rolling, 3-N-1 DME Agency: [employer] Date DME Agency Contacted: 01/12/21 Time DME Agency Contacted: 66 Representative spoke with at DME Agency: Bjorn Loser HH Arranged: PT, OT, RN HH Agency: [facility]         Social Determinants of Health (SDOH) Interventions    Readmission Risk Interventions No flowsheet data found.

## 2021-01-16 NOTE — Discharge Summary (Addendum)
[facility] [place] [facility]   PATIENT NAME: Kirk Sanchez    MR#:  [id number]  DATE OF BIRTH:  07-26-1925  DATE OF ADMISSION:  01/08/2021 ADMITTING PHYSICIAN: Almon Hercules, MD  DATE OF DISCHARGE: 01/16/2021  PRIMARY CARE PHYSICIAN: [facility]    ADMISSION DIAGNOSIS:  SOB (shortness of breath) [R06.02] Right ankle pain [M25.571] Acute on chronic systolic CHF (congestive heart failure) (HCC) [I50.23] Acute on chronic heart failure, unspecified heart failure type (HCC) [I50.9] Atrial fibrillation, unspecified type (HCC) [I48.91]  DISCHARGE DIAGNOSIS:  Acute on chronic systolic congestive heart failure atrial fibrillation on anticoagulation left peroneal vein DVT Hyponatremia SECONDARY DIAGNOSIS:   Past Medical History:  Diagnosis Date   Atrial fibrillation (HCC)    Hyperlipidemia    Hypertension    Kidney stone    Presbycusis of both ears 03/06/2016   Thyroid disease     HOSPITAL COURSE:   87 y.o. male with history of atrial fibrillation, hyperlipidemia, hypertension who was seen in the ER with complaints of a fall 2 weeks prior to presentation with right ankle pain.  Also complained of some chest discomfort.  EKG showed A. fib interventricular conduction delay with RVR.  High-sensitivity troponin was minimally elevated.  Chest x-ray shows stable cardiomegaly with no CHF.   Acute on chronic systolic CHF:  --patient presents with dyspnea, chest discomfort and lower extremity edema.   --TTE with LVEF of 35 to 40% (previously 40-45%), severe concentric LVH, severe RAE and mildly reduced RV SF. -- BNP 220 --Chest x-ray with bibasilar opacities.   --6/15--pt was on IV Lasix-- good diuresis however patient sodium is trending down. Will stop Lasix today. --Nephrology consultation with Dr. Cherylann Ratel. PO Tolvaptan x 1 -Appreciate input from cardiology -6/16--Na 115--117 -- CT chest shows no pulmonary mass. Shows changes of cirrhosis.  Tolvaptan CI. Patient to be present hypertonic saline per nephrology recommendation. ---pt is currently euvolemic except some chronic mild leg edema   Paroxysmal A. fib with RVR: Heart RVR to 130s but converted to sinus rhythm quickly.  TSH within normal.   --HR in 60s to 70s now.  Not on medication at home. CHAD-2-Vasc score >4. -Continue Toprol-XL to 25 mg daily -No Hx of fall but leaves alone.  Started Eliquis after discussion with son, [name].   Hyponatremia: Likely hypervolemic.   Na 128>>>124>>122>>118--stopped lasix>>117>> 115>>3% hypertonic saline>> 130 --   TSH and a.m. cortisol within normal.   -- Nephrology consultation with Dr. Lourdes Sledge -- DC Lasix -- CT abd showed changes of liver cirrhosis hence Tolvaptan CI   Left peroneal vein DVT -Continue Eliquis.   Chest discomfort/elevated troponin: likely demand ischemia from the above.  Resolved. -Continue metoprolol XL and statin   Hypothyroidism: TSH is normal -Continue home synthroid   Normocytic anemia: H&H stable   Diminished hearing   Generalized weakness -Continue PT/OT-- recommends home health PT  patient is best optimized for discharge. He'll follow-up with cardiology, nephrology and primary care.   Procedures:none Family communication : discussed with son [name] on the phone discharge plan.  Consults :cardiology, nephrology CODE STATUS:  DNR--d/w susan Peeples (DIL On 01/15/2021) DVT Prophylaxis :Eliquis Level of care: Med-Surg Status is: Inpatient   Remains inpatient appropriate because:Inpatient level of care appropriate due to severity of illness   Dispo: The patient is from: Home              Anticipated d/c is to: Home with [facility]  Patient currently is medically  stable to d/c.              Difficult to place patient No   will have palliative care see patient as outpatient CONSULTS OBTAINED:  Treatment Team:  Mady HaagensenLateef, Munsoor, MD  DRUG ALLERGIES:  No Known Allergies  DISCHARGE  MEDICATIONS:   Allergies as of 01/16/2021   No Known Allergies      Medication List     TAKE these medications    apixaban 5 MG Tabs tablet Commonly known as: ELIQUIS Take 1 tablet (5 mg total) by mouth 2 (two) times daily.   aspirin 81 MG tablet Take 81 mg by mouth daily.   levothyroxine 25 MCG tablet Commonly known as: SYNTHROID TAKE 1 TABLET BY MOUTH ONCE DAILY ON AN EMPTY STOMACH. WAIT 30 MINUTES BEFORE TAKING OTHER MEDS. What changed: See the new instructions.   Medical Compression Stockings Misc Put on both legs in the morning, take off at night; do NOT sleep in them   metoprolol succinate 25 MG 24 hr tablet Commonly known as: TOPROL-XL Take 1 tablet (25 mg total) by mouth daily at 6 PM. What changed:  medication strength how much to take when to take this additional instructions   simvastatin 40 MG tablet Commonly known as: ZOCOR TAKE 1 TABLET BY MOUTH AT BEDTIME What changed:  how much to take how to take this when to take this               Durable Medical Equipment  (From admission, onward)           Start     Ordered   01/16/21 0908  For home use only DME Walker rolling  Once       Question Answer Comment  Walker: With 5 Inch Wheels   Patient needs a walker to treat with the following condition General weakness      01/16/21 0908            If you experience worsening of your admission symptoms, develop shortness of breath, life threatening emergency, suicidal or homicidal thoughts you must seek medical attention immediately by calling 911 or calling your MD immediately  if symptoms less severe.  You Must read complete instructions/literature along with all the possible adverse reactions/side effects for all the Medicines you take and that have been prescribed to you. Take any new Medicines after you have completely understood and accept all the possible adverse reactions/side effects.   Please note  You were cared for by a  hospitalist during your hospital stay. If you have any questions about your discharge medications or the care you received while you were in the hospital after you are discharged, you can call the unit and asked to speak with the hospitalist on call if the hospitalist that took care of you is not available. Once you are discharged, your primary care physician will handle any further medical issues. Please note that NO REFILLS for any discharge medications will be authorized once you are discharged, as it is imperative that you return to your primary care physician (or establish a relationship with a primary care physician if you do not have one) for your aftercare needs so that they can reassess your need for medications and monitor your lab values. Today   SUBJECTIVE   Doing well Very HOH   VITAL SIGNS:  Blood pressure 132/81, pulse 74, temperature 98.3 F (36.8 C), temperature source Oral, resp. rate 16, height 5\' 7"  (  1.702 m), weight 82.9 kg, SpO2 99 %.  I/O:   Intake/Output Summary (Last 24 hours) at 01/16/2021 1141 Last data filed at 01/16/2021 1032 Gross per 24 hour  Intake 1655.22 ml  Output 3075 ml  Net -1419.78 ml    PHYSICAL EXAMINATION:  GENERAL:  87 y.o.-year-old patient lying in the bed with no acute distress.  LUNGS: Normal breath sounds bilaterally, no wheezing, rales,rhonchi or crepitation. No use of accessory muscles of respiration.  CARDIOVASCULAR: S1, S2 normal. No murmurs, rubs, or gallops.  ABDOMEN: Soft, non-tender, non-distended. Bowel sounds present. No organomegaly or mass.  EXTREMITIES: chronic mild leg edema NEUROLOGIC: non focal PSYCHIATRIC: The patient is alert and awake SKIN: No obvious rash, lesion, or ulcer.   DATA REVIEW:   CBC  Recent Labs  Lab 01/14/21 0719  WBC 7.8  HGB 12.6*  HCT 37.8*  PLT 272    Chemistries  Recent Labs  Lab 01/14/21 0719 01/14/21 1258 01/14/21 2252 01/16/21 0105 01/16/21 0752  NA 118* 117*   < > 126* 130*  K  4.5 4.6  --  4.3  --   CL 79* 78*  --  90*  --   CO2 28 30  --  30  --   GLUCOSE 98 111*  --  105*  --   BUN 21 21  --  18  --   CREATININE 0.78 0.95  --  0.75  --   CALCIUM 8.1* 8.2*  --  8.2*  --   MG 1.8  --   --   --   --   AST  --  62*  --   --   --   ALT  --  50*  --   --   --   ALKPHOS  --  81  --   --   --   BILITOT  --  1.3*  --   --   --    < > = values in this interval not displayed.    Microbiology Results   Recent Results (from the past 240 hour(s))  Resp Panel by RT-PCR (Flu A&B, Covid) Nasopharyngeal Swab     Status: None   Collection Time: 01/08/21  4:03 PM   Specimen: Nasopharyngeal Swab; Nasopharyngeal(NP) swabs in vial transport medium  Result Value Ref Range Status   SARS Coronavirus 2 by RT PCR NEGATIVE NEGATIVE Final    Comment: (NOTE) SARS-CoV-2 target nucleic acids are NOT DETECTED.  The SARS-CoV-2 RNA is generally detectable in upper respiratory specimens during the acute phase of infection. The lowest concentration of SARS-CoV-2 viral copies this assay can detect is 138 copies/mL. A negative result does not preclude SARS-Cov-2 infection and should not be used as the sole basis for treatment or other patient management decisions. A negative result may occur with  improper specimen collection/handling, submission of specimen other than nasopharyngeal swab, presence of viral mutation(s) within the areas targeted by this assay, and inadequate number of viral copies(<138 copies/mL). A negative result must be combined with clinical observations, patient history, and epidemiological information. The expected result is Negative.  Fact Sheet for Patients:  BloggerCourse.com  Fact Sheet for Healthcare Providers:  SeriousBroker.it  This test is no t yet approved or cleared by the [place] FDA and  has been authorized for detection and/or diagnosis of SARS-CoV-2 by FDA under an Emergency Use  Authorization (EUA). This EUA will remain  in effect (meaning this test can be used) for the duration of the COVID-19 declaration  under Section 564(b)(1) of the Act, 21 U.S.C.section 360bbb-3(b)(1), unless the authorization is terminated  or revoked sooner.       Influenza A by PCR NEGATIVE NEGATIVE Final   Influenza B by PCR NEGATIVE NEGATIVE Final    Comment: (NOTE) The Xpert Xpress SARS-CoV-2/FLU/RSV plus assay is intended as an aid in the diagnosis of influenza from Nasopharyngeal swab specimens and should not be used as a sole basis for treatment. Nasal washings and aspirates are unacceptable for Xpert Xpress SARS-CoV-2/FLU/RSV testing.  Fact Sheet for Patients: BloggerCourse.com  Fact Sheet for Healthcare Providers: SeriousBroker.it  This test is not yet approved or cleared by the [place] FDA and has been authorized for detection and/or diagnosis of SARS-CoV-2 by FDA under an Emergency Use Authorization (EUA). This EUA will remain in effect (meaning this test can be used) for the duration of the COVID-19 declaration under Section 564(b)(1) of the Act, 21 U.S.C. section 360bbb-3(b)(1), unless the authorization is terminated or revoked.  Performed at [facility], [address] [place], [place] [address]     RADIOLOGY:  CT CHEST W CONTRAST  Result Date: 01/14/2021 CLINICAL DATA:  Hyponatremia, paroxysmal atrial fibrillation, hypertension, possible lung mass, abnormal chest radiograph EXAM: CT CHEST WITH CONTRAST TECHNIQUE: Multidetector CT imaging of the chest was performed during intravenous contrast administration. Sagittal and coronal MPR images reconstructed from axial data set. CONTRAST:  74mL OMNIPAQUE IOHEXOL 300 MG/ML  SOLN IV COMPARISON:  04/08/2016 Correlation: Chest radiograph 01/08/2021 FINDINGS: Cardiovascular: Enlargement of cardiac chambers. No pericardial effusion. Extensive atherosclerotic  calcifications aorta, proximal great vessels, and coronary arteries. Aorta normal caliber. Pulmonary arteries well opacified without evidence of pulmonary emboli on a non dedicated exam. Mediastinum/Nodes: Few scattered normal mediastinal lymph nodes. No thoracic adenopathy. Base of cervical region normal appearance. Esophagus unremarkable. Lungs/Pleura: BILATERAL moderate pleural effusions with compressive atelectasis of BILATERAL lower lobes. Additional subsegmental atelectasis at base of lingula, RIGHT middle lobe, and anterior RIGHT lower lobe. No segmental consolidation, pneumothorax, or definite pulmonary mass identified, though portions of the lower lobes are obscured by atelectasis. Upper Abdomen: Nodular appearance of hepatic contour consistent with cirrhosis. Remaining visualized upper abdomen unremarkable. Musculoskeletal: Bones demineralized. IMPRESSION: BILATERAL moderate pleural effusions with compressive atelectasis of BILATERAL lower lobes. Cirrhotic appearing liver. Extensive atherosclerotic disease changes including coronary arteries. Aortic Atherosclerosis (ICD10-I70.0). Electronically Signed   By: Ulyses Southward M.D.   On: 01/14/2021 16:35     CODE STATUS:     Code Status Orders  (From admission, onward)           Start     Ordered   01/15/21 1433  Do not attempt resuscitation (DNR)  Continuous       Question Answer Comment  In the event of cardiac or respiratory ARREST Do not call a "code blue"   In the event of cardiac or respiratory ARREST Do not perform Intubation, CPR, defibrillation or ACLS   In the event of cardiac or respiratory ARREST Use medication by any route, position, wound care, and other measures to relive pain and suffering. May use oxygen, suction and manual treatment of airway obstruction as needed for comfort.   Comments Dter in law [name]      01/15/21 1433           Code Status History     Date Active Date Inactive Code Status Order ID Comments User  Context   01/08/2021 1828 01/15/2021 1433 Full Code [id number]  Gertha Calkin, MD ED   09/14/2017  1601 09/16/2017 1505 DNR [id number]  Ramonita Lab, MD Inpatient   09/11/2017 0045 09/14/2017 0928 Full Code [id number]  Oralia Manis, MD Inpatient        TOTAL TIME TAKING CARE OF THIS PATIENT: 35 minutes.    Enedina Finner M.D  [facility]    CC: Primary care physician; [facility], [place]

## 2021-01-16 NOTE — Progress Notes (Signed)
   01/16/21 1035  Clinical Encounter Type  Visited With Patient  Visit Type Initial;Spiritual support;Social support  Referral From  (rounding)  Spiritual Encounters  Spiritual Needs Other (Comment) (not specified; general support)  Chaplain Burris greeted PT and provided general support. Spiritual needs not specified but PT in good spirits. Chaplain Burris made presence and continued support available.

## 2021-01-19 ENCOUNTER — Other Ambulatory Visit: Payer: Self-pay | Admitting: Family Medicine

## 2021-01-19 DIAGNOSIS — E032 Hypothyroidism due to medicaments and other exogenous substances: Secondary | ICD-10-CM

## 2021-01-20 ENCOUNTER — Telehealth: Payer: Self-pay

## 2021-01-20 DIAGNOSIS — E871 Hypo-osmolality and hyponatremia: Secondary | ICD-10-CM | POA: Diagnosis not present

## 2021-01-20 DIAGNOSIS — I5042 Chronic combined systolic (congestive) and diastolic (congestive) heart failure: Secondary | ICD-10-CM | POA: Diagnosis not present

## 2021-01-20 DIAGNOSIS — W19XXXD Unspecified fall, subsequent encounter: Secondary | ICD-10-CM | POA: Diagnosis not present

## 2021-01-20 DIAGNOSIS — I82452 Acute embolism and thrombosis of left peroneal vein: Secondary | ICD-10-CM | POA: Diagnosis not present

## 2021-01-20 DIAGNOSIS — I080 Rheumatic disorders of both mitral and aortic valves: Secondary | ICD-10-CM | POA: Diagnosis not present

## 2021-01-20 DIAGNOSIS — I110 Hypertensive heart disease with heart failure: Secondary | ICD-10-CM | POA: Diagnosis not present

## 2021-01-20 DIAGNOSIS — H9113 Presbycusis, bilateral: Secondary | ICD-10-CM | POA: Diagnosis not present

## 2021-01-20 DIAGNOSIS — Z7901 Long term (current) use of anticoagulants: Secondary | ICD-10-CM | POA: Diagnosis not present

## 2021-01-20 DIAGNOSIS — Z960 Presence of urogenital implants: Secondary | ICD-10-CM | POA: Diagnosis not present

## 2021-01-20 DIAGNOSIS — Z87440 Personal history of urinary (tract) infections: Secondary | ICD-10-CM | POA: Diagnosis not present

## 2021-01-20 DIAGNOSIS — E785 Hyperlipidemia, unspecified: Secondary | ICD-10-CM | POA: Diagnosis not present

## 2021-01-20 DIAGNOSIS — H9193 Unspecified hearing loss, bilateral: Secondary | ICD-10-CM | POA: Diagnosis not present

## 2021-01-20 DIAGNOSIS — I480 Paroxysmal atrial fibrillation: Secondary | ICD-10-CM | POA: Diagnosis not present

## 2021-01-20 DIAGNOSIS — D649 Anemia, unspecified: Secondary | ICD-10-CM | POA: Diagnosis not present

## 2021-01-20 DIAGNOSIS — Z9181 History of falling: Secondary | ICD-10-CM | POA: Diagnosis not present

## 2021-01-20 DIAGNOSIS — E039 Hypothyroidism, unspecified: Secondary | ICD-10-CM | POA: Diagnosis not present

## 2021-01-20 NOTE — Telephone Encounter (Signed)
Verbal orders given  

## 2021-01-20 NOTE — Telephone Encounter (Signed)
Copied from CRM [id number]. Topic: Quick Communication - Home Health Verbal Orders >> Jan 20, 2021 11:20 AM Gaetana Michaelis A wrote: Caller/Agency: Elnita Maxwell / [facility]  Callback Number: [phone]  Requesting OT/PT/Skilled Nursing/Social Work/Speech Therapy: Skilled Nursing   Frequency: 1w9 for education on CHF

## 2021-01-21 ENCOUNTER — Telehealth: Payer: Self-pay

## 2021-01-21 NOTE — Telephone Encounter (Signed)
Copied from CRM [id number]. Topic: General - Other >> Jan 21, 2021 12:05 PM Gaetana Michaelis A wrote: Reason for CRM: Angelique Blonder with [facility] has made contact requesting authorization from patient's PCP to provide additional care for the patient   The patient was recently discharged from [facility]   Please contact to further advise when possible

## 2021-01-22 ENCOUNTER — Encounter: Payer: Self-pay | Admitting: Unknown Physician Specialty

## 2021-01-22 ENCOUNTER — Ambulatory Visit (INDEPENDENT_AMBULATORY_CARE_PROVIDER_SITE_OTHER): Payer: Medicare Other | Admitting: Unknown Physician Specialty

## 2021-01-22 ENCOUNTER — Other Ambulatory Visit: Payer: Self-pay

## 2021-01-22 DIAGNOSIS — E032 Hypothyroidism due to medicaments and other exogenous substances: Secondary | ICD-10-CM | POA: Diagnosis not present

## 2021-01-22 DIAGNOSIS — I480 Paroxysmal atrial fibrillation: Secondary | ICD-10-CM

## 2021-01-22 DIAGNOSIS — E785 Hyperlipidemia, unspecified: Secondary | ICD-10-CM

## 2021-01-22 DIAGNOSIS — Z7189 Other specified counseling: Secondary | ICD-10-CM | POA: Diagnosis not present

## 2021-01-22 DIAGNOSIS — E039 Hypothyroidism, unspecified: Secondary | ICD-10-CM

## 2021-01-22 DIAGNOSIS — I5023 Acute on chronic systolic (congestive) heart failure: Secondary | ICD-10-CM | POA: Diagnosis not present

## 2021-01-22 DIAGNOSIS — E871 Hypo-osmolality and hyponatremia: Secondary | ICD-10-CM | POA: Diagnosis not present

## 2021-01-22 MED ORDER — SIMVASTATIN 40 MG PO TABS: ORAL_TABLET | ORAL | 1 refills | Status: DC

## 2021-01-22 MED ORDER — LEVOTHYROXINE SODIUM 25 MCG PO TABS
25.0000 ug | ORAL_TABLET | Freq: Every day | ORAL | 3 refills | Status: AC
Start: 2021-01-22 — End: ?

## 2021-01-22 NOTE — Assessment & Plan Note (Signed)
Euthyroid in hospital.  Continue synthroid

## 2021-01-22 NOTE — Progress Notes (Signed)
BP 124/72 (BP Location: Right Arm, Patient Position: Sitting, Cuff Size: Large)   Pulse 76   Temp 97.7 F (36.5 C) (Oral)   Resp 18   Ht 5\' 7"  (1.702 m) Comment: per chart  Wt 181 lb 1.6 oz (82.1 kg)   SpO2 94%   BMI 28.36 kg/m    Subjective:    Patient ID: , male    DOB: 07-27-1925, 87 y.o.   MRN: [id number]  HPI: Kirk Sanchez is a 87 y.o. male  Chief Complaint  Patient presents with  . Follow-up    Hospital 06/10-06-17-2022  . Edema    Bilateral legs   Pt was hospitalized for acute on chronic heart failure, hyponatremia, rapid afib.  Discharge summary recommends home PT with f/u with cardiology, nephrology,and primary care. All appointments set up and home health has been to his son's house where he now lives.     Son 01-18-2021 is with him today.  Today he notes that ankles swell during the day and goes down at night.  Denies SOB or pain.    Discharge meds reviewed.    Lams showed last Na 130, up from 115, GFR >60, CBC mild anemia of chronid disease, Euvolemic on 01/08/21   Relevant past medical, surgical, family and social history reviewed and updated as indicated. Interim medical history since our last visit reviewed. Allergies and medications reviewed and updated.  Review of Systems  Constitutional: Negative.   HENT: Negative.    Eyes: Negative.   Respiratory: Negative.    Cardiovascular:  Positive for leg swelling.  Gastrointestinal: Negative.   Genitourinary: Negative.   Musculoskeletal: Negative.    Per HPI unless specifically indicated above     Objective:    BP 124/72 (BP Location: Right Arm, Patient Position: Sitting, Cuff Size: Large)   Pulse 76   Temp 97.7 F (36.5 C) (Oral)   Resp 18   Ht 5\' 7"  (1.702 m) Comment: per chart  Wt 181 lb 1.6 oz (82.1 kg)   SpO2 94%   BMI 28.36 kg/m   Wt Readings from Last 3 Encounters:  01/22/21 181 lb 1.6 oz (82.1 kg)  01/16/21 182 lb 12.8 oz (82.9 kg)  01/08/21 179 lb 14.3 oz (81.6 kg)     Physical Exam Constitutional:      General: He is not in acute distress.    Appearance: Normal appearance. He is well-developed.  HENT:     Head: Normocephalic and atraumatic.  Eyes:     General: Lids are normal. No scleral icterus.       Right eye: No discharge.        Left eye: No discharge.     Conjunctiva/sclera: Conjunctivae normal.  Neck:     Vascular: No carotid bruit or JVD.  Cardiovascular:     Rate and Rhythm: Normal rate. Rhythm irregular.     Heart sounds: Normal heart sounds.     Comments: Bilateral 2 plus edema Pulmonary:     Effort: Pulmonary effort is normal. No respiratory distress.     Breath sounds: Normal breath sounds.  Abdominal:     Palpations: There is no hepatomegaly or splenomegaly.  Musculoskeletal:        General: Normal range of motion.     Cervical back: Normal range of motion and neck supple.  Skin:    General: Skin is warm and dry.     Coloration: Skin is not pale.     Findings: No rash.  Neurological:     Mental Status: He is alert and oriented to person, place, and time.  Psychiatric:        Behavior: Behavior normal.        Thought Content: Thought content normal.        Judgment: Judgment normal.    Results for orders placed or performed during the hospital encounter of 01/08/21  Resp Panel by RT-PCR (Flu A&B, Covid) Nasopharyngeal Swab   Specimen: Nasopharyngeal Swab; Nasopharyngeal(NP) swabs in vial transport medium  Result Value Ref Range   SARS Coronavirus 2 by RT PCR NEGATIVE NEGATIVE   Influenza A by PCR NEGATIVE NEGATIVE   Influenza B by PCR NEGATIVE NEGATIVE  CBC with Differential  Result Value Ref Range   WBC 7.6 4.0 - 10.5 K/uL   RBC 4.04 (L) 4.22 - 5.81 MIL/uL   Hemoglobin 11.6 (L) 13.0 - 17.0 g/dL   HCT 08.6 (L) 57.8 - 46.9 %   MCV 89.9 80.0 - 100.0 fL   MCH 28.7 26.0 - 34.0 pg   MCHC 32.0 30.0 - 36.0 g/dL   RDW 62.9 (H) 52.8 - 41.3 %   Platelets 204 150 - 400 K/uL   nRBC 0.0 0.0 - 0.2 %   Neutrophils Relative %  76 %   Neutro Abs 5.7 1.7 - 7.7 K/uL   Lymphocytes Relative 13 %   Lymphs Abs 1.0 0.7 - 4.0 K/uL   Monocytes Relative 11 %   Monocytes Absolute 0.8 0.1 - 1.0 K/uL   Eosinophils Relative 0 %   Eosinophils Absolute 0.0 0.0 - 0.5 K/uL   Basophils Relative 0 %   Basophils Absolute 0.0 0.0 - 0.1 K/uL   Immature Granulocytes 0 %   Abs Immature Granulocytes 0.03 0.00 - 0.07 K/uL  Comprehensive metabolic panel  Result Value Ref Range   Sodium 132 (L) 135 - 145 mmol/L   Potassium 4.0 3.5 - 5.1 mmol/L   Chloride 95 (L) 98 - 111 mmol/L   CO2 27 22 - 32 mmol/L   Glucose, Bld 124 (H) 70 - 99 mg/dL   BUN 17 8 - 23 mg/dL   Creatinine, Ser 2.44 0.61 - 1.24 mg/dL   Calcium 9.1 8.9 - 01.0 mg/dL   Total Protein 6.3 (L) 6.5 - 8.1 g/dL   Albumin 3.5 3.5 - 5.0 g/dL   AST 25 15 - 41 U/L   ALT 12 0 - 44 U/L   Alkaline Phosphatase 60 38 - 126 U/L   Total Bilirubin 0.9 0.3 - 1.2 mg/dL   GFR, Estimated >27 >25 mL/min   Anion gap 10 5 - 15  Brain natriuretic peptide  Result Value Ref Range   B Natriuretic Peptide 220.0 (H) 0.0 - 100.0 pg/mL  Magnesium  Result Value Ref Range   Magnesium 1.9 1.7 - 2.4 mg/dL  Hemoglobin D6U  Result Value Ref Range   Hgb A1c MFr Bld 5.8 (H) 4.8 - 5.6 %   Mean Plasma Glucose 120 mg/dL  Comprehensive metabolic panel  Result Value Ref Range   Sodium 130 (L) 135 - 145 mmol/L   Potassium 4.1 3.5 - 5.1 mmol/L   Chloride 93 (L) 98 - 111 mmol/L   CO2 27 22 - 32 mmol/L   Glucose, Bld 126 (H) 70 - 99 mg/dL   BUN 16 8 - 23 mg/dL   Creatinine, Ser 4.40 0.61 - 1.24 mg/dL   Calcium 8.8 (L) 8.9 - 10.3 mg/dL   Total Protein 6.3 (L) 6.5 - 8.1 g/dL  Albumin 3.4 (L) 3.5 - 5.0 g/dL   AST 27 15 - 41 U/L   ALT 14 0 - 44 U/L   Alkaline Phosphatase 61 38 - 126 U/L   Total Bilirubin 1.2 0.3 - 1.2 mg/dL   GFR, Estimated >45 >40 mL/min   Anion gap 10 5 - 15  CBC  Result Value Ref Range   WBC 9.1 4.0 - 10.5 K/uL   RBC 4.14 (L) 4.22 - 5.81 MIL/uL   Hemoglobin 11.7 (L) 13.0 - 17.0  g/dL   HCT 98.1 (L) 19.1 - 47.8 %   MCV 89.6 80.0 - 100.0 fL   MCH 28.3 26.0 - 34.0 pg   MCHC 31.5 30.0 - 36.0 g/dL   RDW 29.5 (H) 62.1 - 30.8 %   Platelets 218 150 - 400 K/uL   nRBC 0.0 0.0 - 0.2 %  TSH  Result Value Ref Range   TSH 3.314 0.350 - 4.500 uIU/mL  T4, free  Result Value Ref Range   Free T4 1.14 (H) 0.61 - 1.12 ng/dL  Phosphorus  Result Value Ref Range   Phosphorus 4.4 2.5 - 4.6 mg/dL  Lipid panel  Result Value Ref Range   Cholesterol 188 0 - 200 mg/dL   Triglycerides 80 <657 mg/dL   HDL 67 >84 mg/dL   Total CHOL/HDL Ratio 2.8 RATIO   VLDL 16 0 - 40 mg/dL   LDL Cholesterol 696 (H) 0 - 99 mg/dL  D-dimer, quantitative  Result Value Ref Range   D-Dimer, Quant 1.96 (H) 0.00 - 0.50 ug/mL-FEU  Renal function panel  Result Value Ref Range   Sodium 130 (L) 135 - 145 mmol/L   Potassium 4.1 3.5 - 5.1 mmol/L   Chloride 92 (L) 98 - 111 mmol/L   CO2 30 22 - 32 mmol/L   Glucose, Bld 97 70 - 99 mg/dL   BUN 19 8 - 23 mg/dL   Creatinine, Ser 2.95 0.61 - 1.24 mg/dL   Calcium 8.6 (L) 8.9 - 10.3 mg/dL   Phosphorus 3.9 2.5 - 4.6 mg/dL   Albumin 3.4 (L) 3.5 - 5.0 g/dL   GFR, Estimated >28 >41 mL/min   Anion gap 8 5 - 15  Magnesium  Result Value Ref Range   Magnesium 1.7 1.7 - 2.4 mg/dL  CBC  Result Value Ref Range   WBC 7.7 4.0 - 10.5 K/uL   RBC 4.24 4.22 - 5.81 MIL/uL   Hemoglobin 11.9 (L) 13.0 - 17.0 g/dL   HCT 32.4 (L) 40.1 - 02.7 %   MCV 87.5 80.0 - 100.0 fL   MCH 28.1 26.0 - 34.0 pg   MCHC 32.1 30.0 - 36.0 g/dL   RDW 25.3 (H) 66.4 - 40.3 %   Platelets 226 150 - 400 K/uL   nRBC 0.0 0.0 - 0.2 %  Basic metabolic panel  Result Value Ref Range   Sodium 128 (L) 135 - 145 mmol/L   Potassium 3.6 3.5 - 5.1 mmol/L   Chloride 92 (L) 98 - 111 mmol/L   CO2 28 22 - 32 mmol/L   Glucose, Bld 101 (H) 70 - 99 mg/dL   BUN 25 (H) 8 - 23 mg/dL   Creatinine, Ser 4.74 0.61 - 1.24 mg/dL   Calcium 8.2 (L) 8.9 - 10.3 mg/dL   GFR, Estimated >25 >95 mL/min   Anion gap 8 5 - 15   Renal function panel  Result Value Ref Range   Sodium 127 (L) 135 - 145 mmol/L   Potassium 3.6  3.5 - 5.1 mmol/L   Chloride 90 (L) 98 - 111 mmol/L   CO2 29 22 - 32 mmol/L   Glucose, Bld 99 70 - 99 mg/dL   BUN 24 (H) 8 - 23 mg/dL   Creatinine, Ser 3.24 0.61 - 1.24 mg/dL   Calcium 8.1 (L) 8.9 - 10.3 mg/dL   Phosphorus 3.3 2.5 - 4.6 mg/dL   Albumin 2.8 (L) 3.5 - 5.0 g/dL   GFR, Estimated >40 >10 mL/min   Anion gap 8 5 - 15  Magnesium  Result Value Ref Range   Magnesium 2.0 1.7 - 2.4 mg/dL  CBC  Result Value Ref Range   WBC 6.9 4.0 - 10.5 K/uL   RBC 3.72 (L) 4.22 - 5.81 MIL/uL   Hemoglobin 10.7 (L) 13.0 - 17.0 g/dL   HCT 27.2 (L) 53.6 - 64.4 %   MCV 87.6 80.0 - 100.0 fL   MCH 28.8 26.0 - 34.0 pg   MCHC 32.8 30.0 - 36.0 g/dL   RDW 03.4 (H) 74.2 - 59.5 %   Platelets 213 150 - 400 K/uL   nRBC 0.0 0.0 - 0.2 %  Renal function panel  Result Value Ref Range   Sodium 124 (L) 135 - 145 mmol/L   Potassium 4.3 3.5 - 5.1 mmol/L   Chloride 88 (L) 98 - 111 mmol/L   CO2 25 22 - 32 mmol/L   Glucose, Bld 111 (H) 70 - 99 mg/dL   BUN 29 (H) 8 - 23 mg/dL   Creatinine, Ser 6.38 0.61 - 1.24 mg/dL   Calcium 8.2 (L) 8.9 - 10.3 mg/dL   Phosphorus 3.3 2.5 - 4.6 mg/dL   Albumin 3.0 (L) 3.5 - 5.0 g/dL   GFR, Estimated >75 >64 mL/min   Anion gap 11 5 - 15  Magnesium  Result Value Ref Range   Magnesium 2.1 1.7 - 2.4 mg/dL  Hemoglobin and hematocrit, blood  Result Value Ref Range   Hemoglobin 12.0 (L) 13.0 - 17.0 g/dL   HCT 33.2 (L) 95.1 - 88.4 %  Cortisol-am, blood  Result Value Ref Range   Cortisol - AM 30.9 (H) 6.7 - 22.6 ug/dL  Renal function panel  Result Value Ref Range   Sodium 122 (L) 135 - 145 mmol/L   Potassium 3.4 (L) 3.5 - 5.1 mmol/L   Chloride 83 (L) 98 - 111 mmol/L   CO2 28 22 - 32 mmol/L   Glucose, Bld 92 70 - 99 mg/dL   BUN 24 (H) 8 - 23 mg/dL   Creatinine, Ser 1.66 0.61 - 1.24 mg/dL   Calcium 7.8 (L) 8.9 - 10.3 mg/dL   Phosphorus 2.3 (L) 2.5 - 4.6 mg/dL   Albumin 2.7  (L) 3.5 - 5.0 g/dL   GFR, Estimated >06 >30 mL/min   Anion gap 11 5 - 15  Magnesium  Result Value Ref Range   Magnesium 1.8 1.7 - 2.4 mg/dL  Glucose, capillary  Result Value Ref Range   Glucose-Capillary 137 (H) 70 - 99 mg/dL  Glucose, capillary  Result Value Ref Range   Glucose-Capillary 102 (H) 70 - 99 mg/dL  Renal function panel  Result Value Ref Range   Sodium 118 (LL) 135 - 145 mmol/L   Potassium 4.5 3.5 - 5.1 mmol/L   Chloride 79 (L) 98 - 111 mmol/L   CO2 28 22 - 32 mmol/L   Glucose, Bld 98 70 - 99 mg/dL   BUN 21 8 - 23 mg/dL   Creatinine, Ser 1.60 0.61 - 1.24  mg/dL   Calcium 8.1 (L) 8.9 - 10.3 mg/dL   Phosphorus 2.7 2.5 - 4.6 mg/dL   Albumin 3.3 (L) 3.5 - 5.0 g/dL   GFR, Estimated >53 >66 mL/min   Anion gap 11 5 - 15  Magnesium  Result Value Ref Range   Magnesium 1.8 1.7 - 2.4 mg/dL  CBC  Result Value Ref Range   WBC 7.8 4.0 - 10.5 K/uL   RBC 4.50 4.22 - 5.81 MIL/uL   Hemoglobin 12.6 (L) 13.0 - 17.0 g/dL   HCT 44.0 (L) 34.7 - 42.5 %   MCV 84.0 80.0 - 100.0 fL   MCH 28.0 26.0 - 34.0 pg   MCHC 33.3 30.0 - 36.0 g/dL   RDW 95.6 (H) 38.7 - 56.4 %   Platelets 272 150 - 400 K/uL   nRBC 0.0 0.0 - 0.2 %  BASELINE basic metabolic panel - IF NOT already drawn  Result Value Ref Range   Sodium 117 (LL) 135 - 145 mmol/L   Potassium 4.6 3.5 - 5.1 mmol/L   Chloride 78 (L) 98 - 111 mmol/L   CO2 30 22 - 32 mmol/L   Glucose, Bld 111 (H) 70 - 99 mg/dL   BUN 21 8 - 23 mg/dL   Creatinine, Ser 3.32 0.61 - 1.24 mg/dL   Calcium 8.2 (L) 8.9 - 10.3 mg/dL   GFR, Estimated >95 >18 mL/min   Anion gap 9 5 - 15  Hepatic function panel (Baseline)  Result Value Ref Range   Total Protein 6.3 (L) 6.5 - 8.1 g/dL   Albumin 3.4 (L) 3.5 - 5.0 g/dL   AST 62 (H) 15 - 41 U/L   ALT 50 (H) 0 - 44 U/L   Alkaline Phosphatase 81 38 - 126 U/L   Total Bilirubin 1.3 (H) 0.3 - 1.2 mg/dL   Bilirubin, Direct 0.3 (H) 0.0 - 0.2 mg/dL   Indirect Bilirubin 1.0 (H) 0.3 - 0.9 mg/dL  Sodium  Result Value  Ref Range   Sodium 117 (LL) 135 - 145 mmol/L  Sodium  Result Value Ref Range   Sodium 115 (LL) 135 - 145 mmol/L  Sodium  Result Value Ref Range   Sodium 118 (LL) 135 - 145 mmol/L  Sodium  Result Value Ref Range   Sodium 119 (LL) 135 - 145 mmol/L  Osmolality  Result Value Ref Range   Osmolality 255 (L) 275 - 295 mOsm/kg  Osmolality, urine  Result Value Ref Range   Osmolality, Ur 118 (L) 300 - 900 mOsm/kg  Sodium, urine, random  Result Value Ref Range   Sodium, Ur <10 mmol/L  Basic metabolic panel  Result Value Ref Range   Sodium 126 (L) 135 - 145 mmol/L   Potassium 4.3 3.5 - 5.1 mmol/L   Chloride 90 (L) 98 - 111 mmol/L   CO2 30 22 - 32 mmol/L   Glucose, Bld 105 (H) 70 - 99 mg/dL   BUN 18 8 - 23 mg/dL   Creatinine, Ser 8.41 0.61 - 1.24 mg/dL   Calcium 8.2 (L) 8.9 - 10.3 mg/dL   GFR, Estimated >66 >06 mL/min   Anion gap 6 5 - 15  Sodium  Result Value Ref Range   Sodium 123 (L) 135 - 145 mmol/L  Sodium  Result Value Ref Range   Sodium 130 (L) 135 - 145 mmol/L  ECHOCARDIOGRAM COMPLETE  Result Value Ref Range   Weight 2,960 oz   Height 67 in   BP 101/63 mmHg   Ao pk  vel 1.11 m/s   AR max vel 1.57 cm2   AV Peak grad 4.9 mmHg   Single Plane A2C EF 46.0 %   Single Plane A4C EF 42.5 %   Calc EF 46.1 %   S' Lateral 3.41 cm   Area-P 1/2 3.02 cm2   P 1/2 time 692 msec  Troponin I (High Sensitivity)  Result Value Ref Range   Troponin I (High Sensitivity) 69 (H) <18 ng/L  Troponin I (High Sensitivity)  Result Value Ref Range   Troponin I (High Sensitivity) 82 (H) <18 ng/L      Assessment & Plan:   Problem List Items Addressed This Visit       Unprioritized   Atrial fibrillation (HCC) (Chronic)    Regular rate today.  Continue Metoprolol       Relevant Medications   simvastatin (ZOCOR) 40 MG tablet   HLD (hyperlipidemia) (Chronic)   Relevant Medications   simvastatin (ZOCOR) 40 MG tablet   Hypothyroidism (Chronic)    Euthyroid in hospital.  Continue  synthroid       Relevant Medications   levothyroxine (SYNTHROID) 25 MCG tablet   Acute on chronic systolic CHF (congestive heart failure) (HCC)    Referral made to heart failure clinic.  Lasix stopped in hospital.  Pt denies SOB and no fluid in lungs.  Defer to cardiology about restarting Lasix       Relevant Medications   simvastatin (ZOCOR) 40 MG tablet   Other Relevant Orders   COMPLETE METABOLIC PANEL WITH GFR   Advance care planning    A voluntary discussion about advance care planning including the explanation and discussion of advance directives was extensively discussed  with the patient.  Explanation about the health care proxy and Living will was reviewed and packet with forms with explanation of how to fill them out was given.  Discussed with son who says he does not have those documents.  Referred to Prepare for Your Care website.  In the past has been a DNR bu not at most recent hospitalization.  Encouraged continued conversations.         Hyponatremia    Seems to be resolved.  Check CMP today       Relevant Orders   COMPLETE METABOLIC PANEL WITH GFR     Follow up plan: Return in about 6 months (around 07/24/2021).

## 2021-01-22 NOTE — Assessment & Plan Note (Addendum)
Referral made to heart failure clinic.  Lasix stopped in hospital.  Pt denies SOB and no fluid in lungs.  Defer to cardiology about restarting Lasix

## 2021-01-22 NOTE — Patient Instructions (Signed)
Prepare for your care dot com for advanced care plans such as living will and medical decision maker.

## 2021-01-22 NOTE — Assessment & Plan Note (Signed)
Seems to be resolved.  Check CMP today

## 2021-01-22 NOTE — Assessment & Plan Note (Signed)
A voluntary discussion about advance care planning including the explanation and discussion of advance directives was extensively discussed  with the patient.  Explanation about the health care proxy and Living will was reviewed and packet with forms with explanation of how to fill them out was given.  Discussed with son who says he does not have those documents.  Referred to Prepare for Your Care website.  In the past has been a DNR bu not at most recent hospitalization.  Encouraged continued conversations.

## 2021-01-22 NOTE — Assessment & Plan Note (Addendum)
Regular rate today.  Continue Metoprolol

## 2021-01-23 ENCOUNTER — Telehealth: Payer: Self-pay | Admitting: Family Medicine

## 2021-01-23 LAB — COMPLETE METABOLIC PANEL WITH GFR
AG Ratio: 1.6 (calc) (ref 1.0–2.5)
ALT: 19 U/L (ref 9–46)
AST: 23 U/L (ref 10–35)
Albumin: 3.7 g/dL (ref 3.6–5.1)
Alkaline phosphatase (APISO): 78 U/L (ref 35–144)
BUN: 15 mg/dL (ref 7–25)
CO2: 27 mmol/L (ref 20–32)
Calcium: 8.9 mg/dL (ref 8.6–10.3)
Chloride: 98 mmol/L (ref 98–110)
Creat: 1.02 mg/dL (ref 0.70–1.11)
GFR, Est African American: 73 mL/min/{1.73_m2} (ref >=60)
GFR, Est Non African American: 63 mL/min/{1.73_m2} (ref >=60)
Globulin: 2.3 g/dL (calc) (ref 1.9–3.7)
Glucose, Bld: 99 mg/dL (ref 65–99)
Potassium: 4.8 mmol/L (ref 3.5–5.3)
Sodium: 136 mmol/L (ref 135–146)
Total Bilirubin: 0.9 mg/dL (ref 0.2–1.2)
Total Protein: 6 g/dL — ABNORMAL LOW (ref 6.1–8.1)

## 2021-01-23 NOTE — Progress Notes (Signed)
Relayed results to pt. Pt gave verbal understanding.

## 2021-01-23 NOTE — Telephone Encounter (Signed)
Copied from CRM [id number]. Topic: Quick Communication - Home Health Verbal Orders >> Jan 23, 2021 10:40 AM Jaquita Rector A wrote: Caller/Agency: Creta Levin // [facility]  Callback Number: [phone] ok to lm   Requesting OT/PT/Skilled Nursing/Social Work/Speech Therapy: PT Frequency: 1 w 9

## 2021-01-26 ENCOUNTER — Telehealth: Payer: Self-pay | Admitting: Nurse Practitioner

## 2021-01-26 NOTE — Telephone Encounter (Signed)
Verbal orders given  

## 2021-01-26 NOTE — Telephone Encounter (Signed)
Spoke with patient's son, [name], regarding the Palliative referral/services and all questions were answered and he was in agreement with beginning services with [place].  I have scheduled an In-home Consult for 02/12/21 @ 12:30 PM

## 2021-01-27 DIAGNOSIS — E871 Hypo-osmolality and hyponatremia: Secondary | ICD-10-CM | POA: Insufficient documentation

## 2021-01-28 NOTE — Progress Notes (Signed)
Patient ID: Kirk Sanchez, male    DOB: August 10, 1924, 87 y.o.   MRN: [id number]  HPI  Kirk Sanchez is a 87 y/o male with a history of hyperlipidemia, HTN, CKD, thyroid disease, atrial fibrillation, hyponatremia and chronic heart failure.   Echo report from 01/11/21 reviewed and showed an EF of 35-40% along with severe LVH and mild Kirk.   Admitted 01/08/21 due to acute on chronic HF. Initially given IV lasix and then subsequently stopped due to hyponatremia. Cardiology and nephrology consults obtained. Tolvaptan was given. 3% hypertonic saline given. Chest CT negative for masses. Palliative care to follow at home. Discharged after 8 days.   He presents today for his initial visit with a chief complaint of minimal shortness of breath upon moderate exertion. He describes this as chronic in nature having been present for several years. He has associated pedal edema and hearing loss along with this. He denies any difficulty sleeping, dizziness, abdominal distention, palpitations, chest pain, fatigue or weight gain.   Saw nephrology earlier today. Furosemide continues to remain on hold (was hyponatremic during recent admission)  Past Medical History:  Diagnosis Date   Atrial fibrillation (HCC)    CHF (congestive heart failure) (HCC)    Hyperlipidemia    Hypertension    Kidney stone    Presbycusis of both ears 03/06/2016   Thyroid disease    Past Surgical History:  Procedure Laterality Date   HAND ARTHROPLASTY     HIP ARTHROPLASTY     STENT PLACEMENT RT URETER ([facility] HX)     Family History  Problem Relation Age of Onset   Cancer Mother        stomach   Asthma Father    Stroke Neg Hx    Heart disease Neg Hx    Social History   Tobacco Use   Smoking status: Never   Smokeless tobacco: Never  Substance Use Topics   Alcohol use: No    Alcohol/week: 0.0 standard drinks   No Known Allergies  Prior to Admission medications   Medication Sig Start Date End Date Taking? Authorizing Provider   apixaban (ELIQUIS) 5 MG TABS tablet Take 1 tablet (5 mg total) by mouth 2 (two) times daily. 01/16/21  Yes Enedina Finner, MD  aspirin 81 MG tablet Take 81 mg by mouth daily.   Yes [provider]  Elastic Bandages & Supports (MEDICAL COMPRESSION STOCKINGS) MISC Put on both legs in the morning, take off at night; do NOT sleep in them 11/04/17  Yes Lada, Janit Bern, MD  levothyroxine (SYNTHROID) 25 MCG tablet Take 1 tablet (25 mcg total) by mouth daily before breakfast. 01/22/21  Yes Gabriel Cirri, NP  metoprolol succinate (TOPROL-XL) 25 MG 24 hr tablet Take 1 tablet (25 mg total) by mouth daily at 6 PM. 01/16/21  Yes Enedina Finner, MD  simvastatin (ZOCOR) 40 MG tablet TAKE 1 TABLET BY MOUTH AT BEDTIME 01/22/21  Yes Gabriel Cirri, NP    Review of Systems  Constitutional:  Negative for appetite change and fatigue.  HENT:  Positive for hearing loss. Negative for congestion and sore throat.   Eyes: Negative.   Respiratory:  Positive for shortness of breath (minimal). Negative for chest tightness.   Cardiovascular:  Positive for leg swelling. Negative for chest pain and palpitations.  Gastrointestinal:  Negative for abdominal distention and abdominal pain.  Endocrine: Negative.   Genitourinary: Negative.   Musculoskeletal:  Negative for back pain and neck pain.  Skin: Negative.   Allergic/Immunologic: Negative.  Neurological:  Negative for dizziness and light-headedness.  Hematological:  Negative for adenopathy. Does not bruise/bleed easily.  Psychiatric/Behavioral:  Negative for dysphoric mood and sleep disturbance. The patient is not nervous/anxious.    Vitals:   01/29/21 1101  BP: 125/76  Pulse: (!) 53  Resp: 18  SpO2: 96%  Weight: 178 lb 2 oz (80.8 kg)  Height: 5\' 7"  (1.702 m)   Wt Readings from Last 3 Encounters:  01/29/21 178 lb 2 oz (80.8 kg)  01/22/21 181 lb 1.6 oz (82.1 kg)  01/16/21 182 lb 12.8 oz (82.9 kg)   Lab Results  Component Value Date   CREATININE 1.02  01/22/2021   CREATININE 0.75 01/16/2021   CREATININE 0.95 01/14/2021    Physical Exam Vitals reviewed. Exam conducted with a chaperone present (son).  Constitutional:      Appearance: Normal appearance.  HENT:     Head: Normocephalic.     Right Ear: Decreased hearing noted.     Left Ear: Decreased hearing noted.  Cardiovascular:     Rate and Rhythm: Regular rhythm. Bradycardia present.  Pulmonary:     Effort: Pulmonary effort is normal. No respiratory distress.     Breath sounds: No wheezing or rales.  Abdominal:     General: There is no distension.     Palpations: Abdomen is soft.     Tenderness: There is no abdominal tenderness.  Musculoskeletal:        General: No tenderness.     Cervical back: Normal range of motion and neck supple.     Right lower leg: Edema (lymphedema) present.     Left lower leg: Edema (lymphedema) present.  Skin:    General: Skin is warm and dry.  Neurological:     General: No focal deficit present.     Mental Status: He is alert and oriented to person, place, and time.  Psychiatric:        Mood and Affect: Mood normal.        Behavior: Behavior normal.    Assessment & Plan:  1: Chronic heart failure with reduced ejection fraction- - NYHA class II - euvolemic today - weighing daily; instructed to call for an overnight weight gain of > 2 pounds or a weekly weight gain of > 5 pounds - not adding salt and his son does the cooking; he admits that it's been difficult to not cook with salt but that he's trying - furosemide currently on hold due to hyponatremia - saw cardiology 01/16/2021) 02/11/20 - has palliative care visit on 02/12/21 - BNP 01/08/21 was 220.0  2: HTN- - BP looks good today (125/76) - saw PCP 03/10/21) 01/22/21 - BMP 01/22/21 reviewed and showed sodium 136, potassium 4.8, creatinine 1.02 and GFR 63 - saw nephrology 01/24/21) yesterday and had lab work drawn  3: Atrial fibrillation- - currently rate controlled on metoprolol - also  taking apixaban  4: Lymphedema- - stage 2 - limited in his ability to exercise due to being unsteady on feet - does elevate his legs when he's sitting - instructed son to get compression socks and put them on every morning with removal at bedtime - consider lymphapress compression boots if edema persists   Medication list reviewed.   Return in 6 weeks or sooner for any questions/problems before then.

## 2021-01-29 ENCOUNTER — Encounter: Payer: Self-pay | Admitting: Family

## 2021-01-29 ENCOUNTER — Ambulatory Visit: Payer: Medicare Other | Attending: Family | Admitting: Family

## 2021-01-29 ENCOUNTER — Other Ambulatory Visit: Payer: Self-pay

## 2021-01-29 VITALS — BP 125/76 | HR 53 | Resp 18 | Ht 67.0 in | Wt 178.1 lb

## 2021-01-29 DIAGNOSIS — I509 Heart failure, unspecified: Secondary | ICD-10-CM | POA: Insufficient documentation

## 2021-01-29 DIAGNOSIS — Z79899 Other long term (current) drug therapy: Secondary | ICD-10-CM | POA: Insufficient documentation

## 2021-01-29 DIAGNOSIS — I890 Lymphedema, not elsewhere classified: Secondary | ICD-10-CM | POA: Insufficient documentation

## 2021-01-29 DIAGNOSIS — I4891 Unspecified atrial fibrillation: Secondary | ICD-10-CM | POA: Insufficient documentation

## 2021-01-29 DIAGNOSIS — I10 Essential (primary) hypertension: Secondary | ICD-10-CM

## 2021-01-29 DIAGNOSIS — Z7982 Long term (current) use of aspirin: Secondary | ICD-10-CM | POA: Insufficient documentation

## 2021-01-29 DIAGNOSIS — I130 Hypertensive heart and chronic kidney disease with heart failure and stage 1 through stage 4 chronic kidney disease, or unspecified chronic kidney disease: Secondary | ICD-10-CM | POA: Insufficient documentation

## 2021-01-29 DIAGNOSIS — Z7901 Long term (current) use of anticoagulants: Secondary | ICD-10-CM | POA: Diagnosis not present

## 2021-01-29 DIAGNOSIS — K7460 Unspecified cirrhosis of liver: Secondary | ICD-10-CM | POA: Diagnosis not present

## 2021-01-29 DIAGNOSIS — E871 Hypo-osmolality and hyponatremia: Secondary | ICD-10-CM | POA: Insufficient documentation

## 2021-01-29 DIAGNOSIS — N189 Chronic kidney disease, unspecified: Secondary | ICD-10-CM | POA: Insufficient documentation

## 2021-01-29 DIAGNOSIS — I480 Paroxysmal atrial fibrillation: Secondary | ICD-10-CM

## 2021-01-29 DIAGNOSIS — E785 Hyperlipidemia, unspecified: Secondary | ICD-10-CM | POA: Insufficient documentation

## 2021-01-29 DIAGNOSIS — I5022 Chronic systolic (congestive) heart failure: Secondary | ICD-10-CM

## 2021-01-29 NOTE — Patient Instructions (Addendum)
Continue weighing daily and call for an overnight weight gain of > 2 pounds or a weekly weight gain of >5 pounds.    Get compression socks and put them on every morning with removal at bedtime 

## 2021-02-12 ENCOUNTER — Other Ambulatory Visit: Payer: Medicare Other | Admitting: Nurse Practitioner

## 2021-02-12 ENCOUNTER — Encounter: Payer: Self-pay | Admitting: Nurse Practitioner

## 2021-02-12 ENCOUNTER — Other Ambulatory Visit: Payer: Self-pay

## 2021-02-12 DIAGNOSIS — R531 Weakness: Secondary | ICD-10-CM

## 2021-02-12 DIAGNOSIS — Z515 Encounter for palliative care: Secondary | ICD-10-CM | POA: Diagnosis not present

## 2021-02-12 DIAGNOSIS — R0602 Shortness of breath: Secondary | ICD-10-CM | POA: Diagnosis not present

## 2021-02-12 NOTE — Progress Notes (Signed)
[facility] Palliative Care Consult Note Telephone: [phone]  Fax: [phone]   Date of encounter: 02/12/21 PATIENT NAME: Kirk Sanchez [address] [place] [address]   [id number] (home)  DOB: Nov 11, 1924 MRN: [id number] PRIMARY CARE PROVIDER:    [facility], [place],  [address] [place] [address] [phone]  RESPONSIBLE PARTY:    Contact Information     Name Relation Home Work Mobile   Kirk Sanchez Other   [phone]   [place], [place]   [phone]   [place], [place] [phone]        I met face to face with patient and fson Kirk Sanchez in home. Palliative Care was asked to follow this patient by consultation request of  Kirk Sanchez* to address advance care planning and complex medical decision making. This is the initial visit.  ASSESSMENT AND PLAN / RECOMMENDATIONS:   Advance Care Planning/Goals of Care: Goals include to maximize quality of life and symptom management. Our advance care planning conversation included a discussion about:    The value and importance of advance care planning  Experiences with loved ones who have been seriously ill or have died  Exploration of personal, cultural or spiritual beliefs that might influence medical decisions  Exploration of goals of care in the event of a sudden injury or illness  Identification and preparation of a healthcare agent  Review and updating or creation of an  advance directive document . Decision not to resuscitate or to de-escalate disease focused treatments due to poor prognosis. CODE STATUS: DNR  Symptom Management/Plan: ACP: discussed code status at length, changed to DNR, placed in vynca; will revisit MOST form at next [facility] visit  2. General weakness with debility secondary to CM/CHF; will continue with physical therapy until complete, continue to walk with walker, encouraged mobility, fall precautions.   3.  Shortness of breath secondary to CHF, improved currently not on supplemental O2, monitor respiratory status, continue mobility with pt/ot. Continue to monitor weights, edema. Followed by Darylene Price NP at CHF clinic   4. Goals of Care: Goals include to maximize quality of life and symptom management. Our advance care planning conversation included a discussion about:    The value and importance of advance care planning  Exploration of personal, cultural or spiritual beliefs that might influence medical decisions  Exploration of goals of care in the event of a sudden injury or illness  Identification and preparation of a healthcare agent  Review and updating or creation of an advance directive document.  5. Palliative care encounter; Palliative care encounter; Palliative medicine team will continue to support patient, patient's family, and medical team. Visit consisted of counseling and education dealing with the complex and emotionally intense issues of symptom management and palliative care in the setting of serious and potentially life-threatening illness  6.  f/u 6 weeks for ongoing monitoring chronic disease progression, ongoing discussions complex medical decision making  Follow up Palliative Care Visit: Palliative care will continue to follow for complex medical decision making, advance care planning, and clarification of goals. Return 6 weeks or prn.  I spent 75 minutes providing this consultation. More than 50% of the time in this consultation was spent in counseling and care coordination  PPS: 50%  HOSPICE ELIGIBILITY/DIAGNOSIS: TBD  Chief Complaint: Initial Palliative consult for complex medical decision making  HISTORY OF PRESENT ILLNESS:  Kirk Sanchez is a 87 y.o. year old male  with multiple medical problems including  CM, CHF, afib, left peroneal vein DVT, h/o hyponatremia, HTN, HLD, hypothyroidism, anemia. Hospitalized from 01/08/2021 to 01/16/2021 for shortness of breath, right  ankle pain with left peroneal DVT currently on Eliquis; acute on chronic CHF (TTE with LVEF of 35 to 40% (previously 40-45%), severe concentric LVH, severe RAE and mildly reduced RV SF), afib, hyponatremia (Likely hypervolemic.  Na 128>>>124>>122>>118--stopped lasix>>117>> 115>>3% hypertonic saline>> 130) with lasix d/c, nephrology consulted, elevated troponin likely demand ischemia. He was d/c home with [facility] including PT/OT. I called Mr Chance son to confirm palliative care visit in [place] screening which was negative. I visited [name] outside on his patio. [name] and I talked about purpose of palliative care visit. We talked about how he was feeling today. [name] did make eye contact with verbal cues, very interacting smiling. [name] endorses she is doing well. We talked about symptoms of pain and shortness of breath which he denies. We talked about sleep pattern.[name] endorses functionally independent. [name] endorses he continues to have his own home. After this last hospitalization he has been staying with his son. We talked about his functional abilities where he is able to do his own bath, get dressed and his own need. We talked about past medical problems, chronic disease progression of congestive heart failure. Mr. Guo endorses "can't live forever". We talked about quality of life. Life review. Mr. Kronk talked about his parents, siblings, and 3 adult sons. Mr. Scripter talked about currently residing at his son's, Kirk Sanchez. He talked at length about each son. Kirk Sanchez, Mr. Fedor joined the [facility] visit. We talked about progression with physical therapy with about 4 more weeks left. Mr. Stipp endorses it has really helped his strength, mobility. We talked about aging in the setting of chronic disease. We talked about medical goals of care. We talked about aggressive vs conservative vs comfort care. We talked about code status at length with scenarios. Mr. Fenech endorses he would not to have CPR. Mr.  Auxier and Kirk Sanchez in agreement to DNR, goldenrod form completed will place in vynca. We talked about role PC in poc. We talked about f/u PC visit in 6 weeks when therapy completed. Mr. Brackins in agreement, appointment scheduled. Therapeutic listening, emotional support provided. Questions answered.   History obtained from review of EMR, discussion with son, Mickle Campton and  Mr. Schank.  I reviewed available labs, medications, imaging, studies and related documents from the EMR.  Records reviewed and summarized above.   ROS Full 14 system review of systems performed and negative with exception of: as per HPI.   Physical Exam: Constitutional: NAD General: frail appearing, thin pleasant male EYES: lids intact ENMT: oral mucous membranes moist CV: S1S2, RRR Pulmonary: LCTA, no increased work of breathing, no cough, room air Abdomen: soft and non tender MSK: ambulatory, walks with walker Skin: warm and dry, no rashes or wounds on visible skin Neuro:  + generalized weakness,  no cognitive impairment Psych: non-anxious affect, A and O x 3  CURRENT PROBLEM LIST:  Patient Active Problem List   Diagnosis Date Noted   Acute on chronic systolic CHF (congestive heart failure) (HCC) 01/09/2021   SOB (shortness of breath) 01/08/2021   CHF (congestive heart failure), NYHA class II, chronic, diastolic (HCC) 16/05/9603   Ischemic cardiomyopathy 08/09/2019   Senile purpura ([place]) 02/06/2019   DNR (do not resuscitate)    Hyponatremia 09/10/2017   Right shoulder tendonitis 03/08/2017   Atrial fibrillation ([place]) 03/06/2016   Presbycusis of  both ears 03/06/2016   Advance care planning 03/03/2016   Anemia 03/03/2016   Arteriosclerosis of coronary artery 01/28/2015   Cardiac murmur 01/28/2015   HLD (hyperlipidemia) 01/28/2015   BP (high blood pressure) 01/28/2015   Hypothyroidism 01/28/2015   PAST MEDICAL HISTORY:  Active Ambulatory Problems    Diagnosis Date Noted   Arteriosclerosis of coronary  artery 01/28/2015   Cardiac murmur 01/28/2015   HLD (hyperlipidemia) 01/28/2015   BP (high blood pressure) 01/28/2015   Hypothyroidism 01/28/2015   Advance care planning 03/03/2016   Anemia 03/03/2016   Atrial fibrillation ([place]) 03/06/2016   Presbycusis of both ears 03/06/2016   Right shoulder tendonitis 03/08/2017   Hyponatremia 09/10/2017   DNR (do not resuscitate)    Senile purpura ([place]) 02/06/2019   CHF (congestive heart failure), NYHA class II, chronic, diastolic ([place]) 16/05/9603   Ischemic cardiomyopathy 08/09/2019   SOB (shortness of breath) 01/08/2021   Acute on chronic systolic CHF (congestive heart failure) ([place]) 01/09/2021   Resolved Ambulatory Problems    Diagnosis Date Noted   Otitis externa 06/12/2015   Atrial fibrillation with RVR ([place]) 09/10/2017   Palliative care by specialist    Congestive heart failure ([place])    Weakness generalized    Shortness of breath 01/08/2021   Past Medical History:  Diagnosis Date   CHF (congestive heart failure) (HCC)    Hyperlipidemia    Hypertension    Kidney stone    Thyroid disease    SOCIAL HX:  Social History   Tobacco Use   Smoking status: Never   Smokeless tobacco: Never  Substance Use Topics   Alcohol use: No    Alcohol/week: 0.0 standard drinks   FAMILY HX:  Family History  Problem Relation Age of Onset   Cancer Mother        stomach   Asthma Father    Stroke Neg Hx    Heart disease Neg Hx     reviewed  ALLERGIES: No Known Allergies   PERTINENT MEDICATIONS:  Outpatient Encounter Medications as of 02/12/2021  Medication Sig   apixaban (ELIQUIS) 5 MG TABS tablet Take 1 tablet (5 mg total) by mouth 2 (two) times daily.   aspirin 81 MG tablet Take 81 mg by mouth daily.   Elastic Bandages & Supports (MEDICAL COMPRESSION STOCKINGS) MISC Put on both legs in the morning, take off at night; do NOT sleep in them   levothyroxine (SYNTHROID) 25 MCG tablet Take 1 tablet (25 mcg total) by mouth daily before  breakfast.   metoprolol succinate (TOPROL-XL) 25 MG 24 hr tablet Take 1 tablet (25 mg total) by mouth daily at 6 PM.   simvastatin (ZOCOR) 40 MG tablet TAKE 1 TABLET BY MOUTH AT BEDTIME   No facility-administered encounter medications on file as of 02/12/2021.  Questions and concerns were addressed. The patient/son was encouraged to call with questions and/or concerns. My business card was provided. Provided general support and encouragement, no other unmet needs identified   Thank you for the opportunity to participate in the care of Mr. Mittman.  The palliative care team will continue to follow. Please call our office at [phone] if we can be of additional assistance.   This chart was dictated using voice recognition software.  Despite best efforts to proofread,  errors can occur which can change the documentation meaning.   Solita Macadam Z Leisel Pinette, NP ,   COVID-19 PATIENT SCREENING TOOL Asked and negative response unless otherwise noted:  Have you had symptoms of covid, tested  positive or been in contact with someone with symptoms/positive test in the past 5-10 days? NO

## 2021-02-16 ENCOUNTER — Inpatient Hospital Stay: Payer: Medicare Other | Admitting: Family Medicine

## 2021-02-19 DIAGNOSIS — D649 Anemia, unspecified: Secondary | ICD-10-CM | POA: Diagnosis not present

## 2021-02-19 DIAGNOSIS — Z9181 History of falling: Secondary | ICD-10-CM | POA: Diagnosis not present

## 2021-02-19 DIAGNOSIS — H9193 Unspecified hearing loss, bilateral: Secondary | ICD-10-CM | POA: Diagnosis not present

## 2021-02-19 DIAGNOSIS — Z87440 Personal history of urinary (tract) infections: Secondary | ICD-10-CM | POA: Diagnosis not present

## 2021-02-19 DIAGNOSIS — I5042 Chronic combined systolic (congestive) and diastolic (congestive) heart failure: Secondary | ICD-10-CM | POA: Diagnosis not present

## 2021-02-19 DIAGNOSIS — I480 Paroxysmal atrial fibrillation: Secondary | ICD-10-CM | POA: Diagnosis not present

## 2021-02-19 DIAGNOSIS — I080 Rheumatic disorders of both mitral and aortic valves: Secondary | ICD-10-CM | POA: Diagnosis not present

## 2021-02-19 DIAGNOSIS — Z960 Presence of urogenital implants: Secondary | ICD-10-CM | POA: Diagnosis not present

## 2021-02-19 DIAGNOSIS — W19XXXD Unspecified fall, subsequent encounter: Secondary | ICD-10-CM | POA: Diagnosis not present

## 2021-02-19 DIAGNOSIS — I82452 Acute embolism and thrombosis of left peroneal vein: Secondary | ICD-10-CM | POA: Diagnosis not present

## 2021-02-19 DIAGNOSIS — Z7901 Long term (current) use of anticoagulants: Secondary | ICD-10-CM | POA: Diagnosis not present

## 2021-02-19 DIAGNOSIS — E785 Hyperlipidemia, unspecified: Secondary | ICD-10-CM | POA: Diagnosis not present

## 2021-02-19 DIAGNOSIS — I110 Hypertensive heart disease with heart failure: Secondary | ICD-10-CM | POA: Diagnosis not present

## 2021-02-19 DIAGNOSIS — E039 Hypothyroidism, unspecified: Secondary | ICD-10-CM | POA: Diagnosis not present

## 2021-02-19 DIAGNOSIS — E871 Hypo-osmolality and hyponatremia: Secondary | ICD-10-CM | POA: Diagnosis not present

## 2021-02-19 DIAGNOSIS — H9113 Presbycusis, bilateral: Secondary | ICD-10-CM | POA: Diagnosis not present

## 2021-03-18 NOTE — Progress Notes (Signed)
Patient ID: Kirk Sanchez, male    DOB: May 29, 1925, 87 y.o.   MRN: [id number]  HPI  Mr Hamed is a 87 y/o male with a history of hyperlipidemia, HTN, CKD, thyroid disease, atrial fibrillation, hyponatremia and chronic heart failure.   Echo report from 01/11/21 reviewed and showed an EF of 35-40% along with severe LVH and mild MR.   Admitted 01/08/21 due to acute on chronic HF. Initially given IV lasix and then subsequently stopped due to hyponatremia. Cardiology and nephrology consults obtained. Tolvaptan was given. 3% hypertonic saline given. Chest CT negative for masses. Palliative care to follow at home. Discharged after 8 days.   He presents today for a follow-up visit with a chief complaint of minimal shortness of breath upon moderate exertion. Says that this has been present for several years but, overall, he feels good. He has associated hearing loss & pedal edema along with this. He denies any difficulty sleeping, abdominal distention, palpitations, chest pain, cough, dizziness, fatigue or weight gain.   Past Medical History:  Diagnosis Date   Atrial fibrillation (HCC)    CHF (congestive heart failure) (HCC)    Hyperlipidemia    Hypertension    Kidney stone    Presbycusis of both ears 03/06/2016   Thyroid disease    Past Surgical History:  Procedure Laterality Date   HAND ARTHROPLASTY     HIP ARTHROPLASTY     STENT PLACEMENT RT URETER ([facility] HX)     Family History  Problem Relation Age of Onset   Cancer Mother        stomach   Asthma Father    Stroke Neg Hx    Heart disease Neg Hx    Social History   Tobacco Use   Smoking status: Never   Smokeless tobacco: Never  Substance Use Topics   Alcohol use: No    Alcohol/week: 0.0 standard drinks   No Known Allergies  Prior to Admission medications   Medication Sig Start Date End Date Taking? Authorizing Provider  apixaban (ELIQUIS) 5 MG TABS tablet Take 1 tablet (5 mg total) by mouth 2 (two) times daily. 01/16/21  Yes  Enedina Finner, MD  Elastic Bandages & Supports (MEDICAL COMPRESSION STOCKINGS) MISC Put on both legs in the morning, take off at night; do NOT sleep in them 11/04/17  Yes Lada, Janit Bern, MD  levothyroxine (SYNTHROID) 25 MCG tablet Take 1 tablet (25 mcg total) by mouth daily before breakfast. 01/22/21  Yes Gabriel Cirri, NP  metoprolol succinate (TOPROL-XL) 25 MG 24 hr tablet Take 1 tablet (25 mg total) by mouth daily at 6 PM. 01/16/21  Yes Enedina Finner, MD  simvastatin (ZOCOR) 40 MG tablet TAKE 1 TABLET BY MOUTH AT BEDTIME 01/22/21  Yes Gabriel Cirri, NP  aspirin 81 MG tablet Take 81 mg by mouth daily. Patient not taking: Reported on 03/19/2021    [provider]    Review of Systems  Constitutional:  Negative for appetite change and fatigue.  HENT:  Positive for hearing loss. Negative for congestion and sore throat.   Eyes: Negative.   Respiratory:  Positive for shortness of breath (minimal). Negative for chest tightness.   Cardiovascular:  Positive for leg swelling. Negative for chest pain and palpitations.  Gastrointestinal:  Negative for abdominal distention and abdominal pain.  Endocrine: Negative.   Genitourinary: Negative.   Musculoskeletal:  Negative for back pain and neck pain.  Skin: Negative.   Allergic/Immunologic: Negative.   Neurological:  Negative for dizziness and  light-headedness.  Hematological:  Negative for adenopathy. Does not bruise/bleed easily.  Psychiatric/Behavioral:  Negative for dysphoric mood and sleep disturbance. The patient is not nervous/anxious.    Vitals:   03/19/21 1121  BP: 128/81  Pulse: 70  Resp: 18  SpO2: 99%  Weight: 168 lb 2 oz (76.3 kg)  Height: 5\' 10"  (1.778 m)   Wt Readings from Last 3 Encounters:  03/19/21 168 lb 2 oz (76.3 kg)  01/29/21 178 lb 2 oz (80.8 kg)  01/22/21 181 lb 1.6 oz (82.1 kg)   Lab Results  Component Value Date   CREATININE 1.02 01/22/2021   CREATININE 0.75 01/16/2021   CREATININE 0.95 01/14/2021     Physical Exam Vitals reviewed. Exam conducted with a chaperone present (son).  Constitutional:      Appearance: Normal appearance.  HENT:     Head: Normocephalic.     Right Ear: Decreased hearing noted.     Left Ear: Decreased hearing noted.  Cardiovascular:     Rate and Rhythm: Normal rate and regular rhythm.  Pulmonary:     Effort: Pulmonary effort is normal. No respiratory distress.     Breath sounds: No wheezing or rales.  Abdominal:     General: There is no distension.     Palpations: Abdomen is soft.     Tenderness: There is no abdominal tenderness.  Musculoskeletal:        General: No tenderness.     Cervical back: Normal range of motion and neck supple.     Right lower leg: Edema (lymphedema) present.     Left lower leg: Edema (lymphedema) present.  Skin:    General: Skin is warm and dry.  Neurological:     General: No focal deficit present.     Mental Status: He is alert and oriented to person, place, and time.  Psychiatric:        Mood and Affect: Mood normal.        Behavior: Behavior normal.    Assessment & Plan:  1: Chronic heart failure with reduced ejection fraction- - NYHA class II - euvolemic today - weighing daily; instructed to call for an overnight weight gain of > 2 pounds or a weekly weight gain of > 5 pounds - weight down 10 pounds from last visit here 6 weeks ago - not adding salt and his son does the cooking; he admits that it's been difficult to not cook with salt but that he's trying - furosemide currently on hold due to hyponatremia - saw cardiology 01/16/2021) 02/11/20 - on GDMT of metoprolol - will start entresto 24/26mg ; 30 day voucher provided to him - will check BMP next visit - had palliative care visit on 02/12/21 - BNP 01/08/21 was 220.0  2: HTN- - BP looks good today - saw PCP 03/10/21) 01/22/21 - BMP 01/29/21 reviewed and showed sodium 136, potassium 4.3, creatinine 82 and GFR 76 - saw nephrology 01/31/21) 01/29/21  3: Atrial  fibrillation- - currently rate controlled on metoprolol - also taking apixaban  4: Lymphedema- - stage 2 - limited in his ability to exercise due to being unsteady on feet - does elevate his legs when he's sitting - encouraged son to get compression socks and put them on every morning with removal at bedtime - needs to also cut the elastic at the top of the socks - consider lymphapress compression boots if edema persists   Patient did not bring his medications nor a list. Each medication was verbally reviewed with  the patient and he was encouraged to bring the bottles to every visit to confirm accuracy of list.

## 2021-03-19 ENCOUNTER — Ambulatory Visit: Payer: Medicare Other | Attending: Family | Admitting: Family

## 2021-03-19 ENCOUNTER — Encounter: Payer: Self-pay | Admitting: Family

## 2021-03-19 ENCOUNTER — Other Ambulatory Visit: Payer: Self-pay

## 2021-03-19 VITALS — BP 128/81 | HR 70 | Resp 18 | Ht 70.0 in | Wt 168.1 lb

## 2021-03-19 DIAGNOSIS — N189 Chronic kidney disease, unspecified: Secondary | ICD-10-CM | POA: Diagnosis not present

## 2021-03-19 DIAGNOSIS — I890 Lymphedema, not elsewhere classified: Secondary | ICD-10-CM

## 2021-03-19 DIAGNOSIS — I5022 Chronic systolic (congestive) heart failure: Secondary | ICD-10-CM

## 2021-03-19 DIAGNOSIS — I10 Essential (primary) hypertension: Secondary | ICD-10-CM

## 2021-03-19 DIAGNOSIS — I130 Hypertensive heart and chronic kidney disease with heart failure and stage 1 through stage 4 chronic kidney disease, or unspecified chronic kidney disease: Secondary | ICD-10-CM | POA: Diagnosis not present

## 2021-03-19 DIAGNOSIS — I4891 Unspecified atrial fibrillation: Secondary | ICD-10-CM | POA: Diagnosis not present

## 2021-03-19 DIAGNOSIS — Z7901 Long term (current) use of anticoagulants: Secondary | ICD-10-CM | POA: Insufficient documentation

## 2021-03-19 DIAGNOSIS — Z79899 Other long term (current) drug therapy: Secondary | ICD-10-CM | POA: Diagnosis not present

## 2021-03-19 DIAGNOSIS — E785 Hyperlipidemia, unspecified: Secondary | ICD-10-CM | POA: Insufficient documentation

## 2021-03-19 DIAGNOSIS — I480 Paroxysmal atrial fibrillation: Secondary | ICD-10-CM

## 2021-03-19 MED ORDER — SACUBITRIL-VALSARTAN 24-26 MG PO TABS: 1.0000 | ORAL_TABLET | Freq: Two times a day (BID) | ORAL | 3 refills | Status: DC

## 2021-03-19 NOTE — Patient Instructions (Signed)
Continue weighing daily and call for an overnight weight gain of > 2 pounds or a weekly weight gain of >5 pounds. 

## 2021-04-16 ENCOUNTER — Other Ambulatory Visit: Payer: Medicare Other | Admitting: Nurse Practitioner

## 2021-04-16 ENCOUNTER — Other Ambulatory Visit: Payer: Self-pay

## 2021-04-16 ENCOUNTER — Telehealth: Payer: Self-pay | Admitting: Nurse Practitioner

## 2021-04-16 NOTE — Telephone Encounter (Signed)
I called Mr. Kirk Sanchez for pc f/u visit, Mr. Kirk Sanchez son endorses Mr. Kirk Sanchez was not at home, won't return until after next week. Rescheduled visit per request.

## 2021-04-27 ENCOUNTER — Other Ambulatory Visit
Admission: RE | Admit: 2021-04-27 | Discharge: 2021-04-27 | Disposition: A | Discharge status: Home / Self Care | Payer: Medicare Other | Attending: Family | Admitting: Family

## 2021-04-27 ENCOUNTER — Other Ambulatory Visit: Payer: Self-pay

## 2021-04-27 ENCOUNTER — Ambulatory Visit (HOSPITAL_BASED_OUTPATIENT_CLINIC_OR_DEPARTMENT_OTHER): Payer: Medicare Other | Admitting: Family

## 2021-04-27 ENCOUNTER — Encounter: Payer: Self-pay | Admitting: Family

## 2021-04-27 VITALS — BP 139/102 | HR 101 | Resp 16 | Ht 68.0 in | Wt 170.1 lb

## 2021-04-27 DIAGNOSIS — Z7901 Long term (current) use of anticoagulants: Secondary | ICD-10-CM | POA: Insufficient documentation

## 2021-04-27 DIAGNOSIS — N189 Chronic kidney disease, unspecified: Secondary | ICD-10-CM | POA: Diagnosis not present

## 2021-04-27 DIAGNOSIS — I480 Paroxysmal atrial fibrillation: Secondary | ICD-10-CM

## 2021-04-27 DIAGNOSIS — I890 Lymphedema, not elsewhere classified: Secondary | ICD-10-CM | POA: Insufficient documentation

## 2021-04-27 DIAGNOSIS — Z79890 Hormone replacement therapy (postmenopausal): Secondary | ICD-10-CM | POA: Insufficient documentation

## 2021-04-27 DIAGNOSIS — I130 Hypertensive heart and chronic kidney disease with heart failure and stage 1 through stage 4 chronic kidney disease, or unspecified chronic kidney disease: Secondary | ICD-10-CM | POA: Insufficient documentation

## 2021-04-27 DIAGNOSIS — Z7982 Long term (current) use of aspirin: Secondary | ICD-10-CM | POA: Diagnosis not present

## 2021-04-27 DIAGNOSIS — E785 Hyperlipidemia, unspecified: Secondary | ICD-10-CM | POA: Diagnosis not present

## 2021-04-27 DIAGNOSIS — E871 Hypo-osmolality and hyponatremia: Secondary | ICD-10-CM | POA: Diagnosis not present

## 2021-04-27 DIAGNOSIS — I4891 Unspecified atrial fibrillation: Secondary | ICD-10-CM | POA: Insufficient documentation

## 2021-04-27 DIAGNOSIS — I10 Essential (primary) hypertension: Secondary | ICD-10-CM

## 2021-04-27 DIAGNOSIS — Z79899 Other long term (current) drug therapy: Secondary | ICD-10-CM | POA: Insufficient documentation

## 2021-04-27 DIAGNOSIS — I5022 Chronic systolic (congestive) heart failure: Secondary | ICD-10-CM | POA: Diagnosis not present

## 2021-04-27 DIAGNOSIS — E079 Disorder of thyroid, unspecified: Secondary | ICD-10-CM | POA: Diagnosis not present

## 2021-04-27 LAB — BASIC METABOLIC PANEL
Anion gap: 7 (ref 5–15)
BUN: 22 mg/dL (ref 8–23)
CO2: 32 mmol/L (ref 22–32)
Calcium: 9 mg/dL (ref 8.9–10.3)
Chloride: 95 mmol/L — ABNORMAL LOW (ref 98–111)
Creatinine, Ser: 0.82 mg/dL (ref 0.61–1.24)
GFR, Estimated: >60 mL/min (ref >60)
Glucose, Bld: 111 mg/dL — ABNORMAL HIGH (ref 70–99)
Potassium: 4.5 mmol/L (ref 3.5–5.1)
Sodium: 134 mmol/L — ABNORMAL LOW (ref 135–145)

## 2021-04-27 MED ORDER — METOPROLOL SUCCINATE ER 25 MG PO TB24: 25.0000 mg | ORAL_TABLET | Freq: Every day | ORAL | 3 refills | Status: DC

## 2021-04-27 MED ORDER — APIXABAN 5 MG PO TABS: 5.0000 mg | ORAL_TABLET | Freq: Two times a day (BID) | ORAL | 3 refills | Status: DC

## 2021-04-27 NOTE — Progress Notes (Signed)
Patient ID: Kirk Sanchez, male    DOB: [birth year], 87 y.o.   MRN: [id number]   [name] is a 87 y/o male with a history of hyperlipidemia, HTN, CKD, thyroid disease, atrial fibrillation, hyponatremia and chronic heart failure.   Echo report from 01/11/21 reviewed and showed an EF of 35-40% along with severe LVH and mild MR.   Admitted 01/08/21 due to acute on chronic HF. Initially given IV lasix and then subsequently stopped due to hyponatremia. Cardiology and nephrology consults obtained. Tolvaptan was given. 3% hypertonic saline given. Chest CT negative for masses. Palliative care to follow at home. Discharged after 8 days.   He presents today for a follow-up visit with a chief complaint of minimal shortness of breath upon moderate exertion and pedal edema. Says that this has been present for several years but, overall, he feels good. He denies any difficulty sleeping, abdominal distention, palpitations, chest pain, cough, dizziness, fatigue or weight gain.   He is weighing himself daily and reports he normally stays around 165.   Past Medical History:  Diagnosis Date   Atrial fibrillation (HCC)    CHF (congestive heart failure) (HCC)    Hyperlipidemia    Hypertension    Kidney stone    Presbycusis of both ears 03/06/2016   Thyroid disease    Past Surgical History:  Procedure Laterality Date   HAND ARTHROPLASTY     HIP ARTHROPLASTY     STENT PLACEMENT RT URETER ([facility] HX)     Family History  Problem Relation Age of Onset   Cancer Mother        stomach   Asthma Father    Stroke Neg Hx    Heart disease Neg Hx    Social History   Tobacco Use   Smoking status: Never   Smokeless tobacco: Never  Substance Use Topics   Alcohol use: No    Alcohol/week: 0.0 standard drinks   No Known Allergies  Prior to Admission medications   Medication Sig Start Date End Date Taking? Authorizing Provider  apixaban (ELIQUIS) 5 MG TABS tablet Take 1 tablet (5 mg total) by mouth 2 (two)  times daily. 01/16/21  Yes Enedina Finner, MD  Elastic Bandages & Supports (MEDICAL COMPRESSION STOCKINGS) MISC Put on both legs in the morning, take off at night; do NOT sleep in them 11/04/17  Yes Lada, Janit Bern, MD  levothyroxine (SYNTHROID) 25 MCG tablet Take 1 tablet (25 mcg total) by mouth daily before breakfast. 01/22/21  Yes Gabriel Cirri, NP  metoprolol succinate (TOPROL-XL) 25 MG 24 hr tablet Take 1 tablet (25 mg total) by mouth daily at 6 PM. 01/16/21  Yes Enedina Finner, MD  simvastatin (ZOCOR) 40 MG tablet TAKE 1 TABLET BY MOUTH AT BEDTIME 01/22/21  Yes Gabriel Cirri, NP  aspirin 81 MG tablet Take 81 mg by mouth daily. Patient not taking: Reported on 03/19/2021    [provider]    Review of Systems  Constitutional:  Negative for appetite change and fatigue.  HENT:  Positive for hearing loss. Negative for congestion and sore throat.   Eyes: Negative.   Respiratory:  Positive for shortness of breath (minimal with exertion). Negative for chest tightness.   Cardiovascular:  Positive for leg swelling. Negative for chest pain and palpitations.  Gastrointestinal:  Negative for abdominal distention and abdominal pain.  Endocrine: Negative.   Genitourinary: Negative.   Musculoskeletal:  Negative for back pain and neck pain.  Skin: Negative.   Allergic/Immunologic: Negative.  Neurological:  Negative for dizziness and light-headedness.  Hematological:  Negative for adenopathy. Does not bruise/bleed easily.  Psychiatric/Behavioral:  Negative for dysphoric mood and sleep disturbance. The patient is not nervous/anxious.    Vitals:   04/27/21 1136  BP: (!) 139/102  Pulse: (!) 101  Resp: 16  Weight: 170 lb 2 oz (77.2 kg)  Height: 5\' 8"  (1.727 m)   Wt Readings from Last 3 Encounters:  04/27/21 170 lb 2 oz (77.2 kg)  03/19/21 168 lb 2 oz (76.3 kg)  01/29/21 178 lb 2 oz (80.8 kg)   Lab Results  Component Value Date   CREATININE 1.02 01/22/2021   CREATININE 0.75 01/16/2021    CREATININE 0.95 01/14/2021    Physical Exam Vitals reviewed. Exam conducted with a chaperone present (son).  Constitutional:      Appearance: Normal appearance.  HENT:     Head: Normocephalic.     Right Ear: Decreased hearing noted.     Left Ear: Decreased hearing noted.  Cardiovascular:     Rate and Rhythm: Tachycardia present. Rhythm irregular.  Pulmonary:     Effort: Pulmonary effort is normal. No respiratory distress.     Breath sounds: No wheezing or rales.  Abdominal:     General: There is no distension.     Palpations: Abdomen is soft.     Tenderness: There is no abdominal tenderness.  Musculoskeletal:        General: No tenderness.     Cervical back: Normal range of motion and neck supple.     Right lower leg: Edema (trace) present.     Left lower leg: Edema (trace) present.  Skin:    General: Skin is warm and dry.  Neurological:     General: No focal deficit present.     Mental Status: He is alert and oriented to person, place, and time.  Psychiatric:        Mood and Affect: Mood normal.        Behavior: Behavior normal.    Assessment & Plan:  1: Chronic heart failure with reduced ejection fraction- - NYHA class II - euvolemic today - weighing daily; instructed to call for an overnight weight gain of > 2 pounds or a weekly weight gain of > 5 pounds - weight today 170 pounds, last visit 168 - trying to adhere to low sodium diet, son cooks his meals - furosemide held previously due to hyponatremia - saw cardiology 01/16/2021) 02/11/20 - on GDMT of metoprolol, however ran out 7 days ago - entresto 24/26mg  started at last visit; denies any adverse effects - check BMP today  - is enrolled in outpatient palliative care services - BNP 01/08/21 was 220.0  2: HTN- - BP 139/102, HR 101, pt ran out of his metoprolol a week ago. Refill sent in. - saw PCP 03/10/21) 01/22/21 - BMP 01/29/21 reviewed and showed sodium 136, potassium 4.3, creatinine 82 and GFR 76 - saw nephrology  01/31/21) 01/29/21  3: Atrial fibrillation- - irregular during exam today - ran out of his apixaban and metoprolol for the last 7 days - sent in refill for apixaban and metoprolol  4: Lymphedema- - stage 2 - limited in his ability to exercise due to being unsteady on feet - does elevate his legs when he's sitting - encouraged son to get compression socks and put them on every morning with removal at bedtime - needs to also cut the elastic at the top of the socks - wearing lymphapress compression boots  Medication bottles reviewed  Return in one month to follow up with BP, HR and consider increasing entresto dose.

## 2021-04-27 NOTE — Patient Instructions (Signed)
Pick up prescriptions for metoprolol and eliquis today  If you need refills and it is towards the end of the week, call us directly and we can assist with refilling metoprolol and eliquis  Return in one month.

## 2021-05-14 ENCOUNTER — Telehealth: Payer: Self-pay | Admitting: Nurse Practitioner

## 2021-05-14 NOTE — Telephone Encounter (Signed)
Spoke with patient's son [name], to see if they wanted to continue with Palliative services for patient and to schedule a f/u visit with NP and he felt that it was okay at this point and time to go ahead and discharge him, since he is still able to get to his MD appointments.  I told him that if they needed [place] to come back out in the future to just contact his PCP and let them know and he was in agreement with this.

## 2021-05-25 NOTE — Progress Notes (Signed)
Patient ID: GIONNI VACA, male    DOB: May 10, 1925, 87 y.o.   MRN: [id number]   Mr Shellhammer is a 87 y/o male with a history of hyperlipidemia, HTN, CKD, thyroid disease, atrial fibrillation, hyponatremia and chronic heart failure.   Echo report from 01/11/21 reviewed and showed an EF of 35-40% along with severe LVH and mild MR.   Admitted 01/08/21 due to acute on chronic HF. Initially given IV lasix and then subsequently stopped due to hyponatremia. Cardiology and nephrology consults obtained. Tolvaptan was given. 3% hypertonic saline given. Chest CT negative for masses. Palliative care to follow at home. Discharged after 8 days.   He presents today for a follow-up visit with a chief complaint of minimal shortness of breath with moderate exertion. He describes this as chronic in nature having been present for several years. He has associated pedal edema along with this. He denies any difficulty sleeping, dizziness, abdominal distention, palpitations, chest pain, cough or fatigue.   Overall he says that he "feels great".   Weighing sporadically but son says that he has working scales at home.   Past Medical History:  Diagnosis Date   Atrial fibrillation (HCC)    CHF (congestive heart failure) (HCC)    Hyperlipidemia    Hypertension    Kidney stone    Presbycusis of both ears 03/06/2016   Thyroid disease    Past Surgical History:  Procedure Laterality Date   HAND ARTHROPLASTY     HIP ARTHROPLASTY     STENT PLACEMENT RT URETER ([facility] HX)     Family History  Problem Relation Age of Onset   Cancer Mother        stomach   Asthma Father    Stroke Neg Hx    Heart disease Neg Hx    Social History   Tobacco Use   Smoking status: Never   Smokeless tobacco: Never  Substance Use Topics   Alcohol use: No    Alcohol/week: 0.0 standard drinks   No Known Allergies  Prior to Admission medications   Medication Sig Start Date End Date Taking? Authorizing Provider  apixaban (ELIQUIS) 5 MG  TABS tablet Take 1 tablet (5 mg total) by mouth 2 (two) times daily. 04/27/21  Yes Delma Freeze, FNP  levothyroxine (SYNTHROID) 25 MCG tablet Take 1 tablet (25 mcg total) by mouth daily before breakfast. 01/22/21  Yes Gabriel Cirri, NP  metoprolol succinate (TOPROL-XL) 25 MG 24 hr tablet Take 1 tablet (25 mg total) by mouth daily at 6 PM. 04/27/21  Yes Aloise Copus A, FNP  sacubitril-valsartan (ENTRESTO) 24-26 MG Take 1 tablet by mouth 2 (two) times daily. 03/19/21  Yes Clarisa Kindred A, FNP  simvastatin (ZOCOR) 40 MG tablet TAKE 1 TABLET BY MOUTH AT BEDTIME 01/22/21  Yes Gabriel Cirri, NP  aspirin 81 MG tablet Take 81 mg by mouth daily. Patient not taking: No sig reported    [provider]  Elastic Bandages & Supports (MEDICAL COMPRESSION STOCKINGS) MISC Put on both legs in the morning, take off at night; do NOT sleep in them Patient not taking: Reported on 05/26/2021 11/04/17   Kerman Passey, MD   Review of Systems  Constitutional:  Negative for appetite change and fatigue.  HENT:  Positive for hearing loss. Negative for congestion and sore throat.   Eyes: Negative.   Respiratory:  Positive for shortness of breath (minimal with exertion). Negative for cough and chest tightness.   Cardiovascular:  Positive for leg swelling. Negative  for chest pain and palpitations.  Gastrointestinal:  Negative for abdominal distention and abdominal pain.  Endocrine: Negative.   Genitourinary: Negative.   Musculoskeletal:  Negative for back pain and neck pain.  Skin: Negative.   Allergic/Immunologic: Negative.   Neurological:  Negative for dizziness and light-headedness.  Hematological:  Negative for adenopathy. Does not bruise/bleed easily.  Psychiatric/Behavioral:  Negative for dysphoric mood and sleep disturbance. The patient is not nervous/anxious.    Vitals:   05/26/21 1217  BP: (!) 143/87  Pulse: 81  Resp: 16  SpO2: 98%  Weight: 180 lb 8 oz (81.9 kg)  Height: 5\' 7"  (1.702 m)   Wt  Readings from Last 3 Encounters:  05/26/21 180 lb 8 oz (81.9 kg)  04/27/21 170 lb 2 oz (77.2 kg)  03/19/21 168 lb 2 oz (76.3 kg)   Lab Results  Component Value Date   CREATININE 0.82 04/27/2021   CREATININE 1.02 01/22/2021   CREATININE 0.75 01/16/2021    Physical Exam Vitals reviewed. Exam conducted with a chaperone present (son).  Constitutional:      Appearance: Normal appearance.  HENT:     Head: Normocephalic.     Right Ear: Decreased hearing noted.     Left Ear: Decreased hearing noted.  Cardiovascular:     Rate and Rhythm: Normal rate. Rhythm irregular.  Pulmonary:     Effort: Pulmonary effort is normal. No respiratory distress.     Breath sounds: No wheezing or rales.  Abdominal:     General: There is no distension.     Palpations: Abdomen is soft.     Tenderness: There is no abdominal tenderness.  Musculoskeletal:        General: No tenderness.     Cervical back: Normal range of motion and neck supple.     Right lower leg: Edema (trace) present.     Left lower leg: Edema (trace) present.  Skin:    General: Skin is warm and dry.  Neurological:     General: No focal deficit present.     Mental Status: He is alert and oriented to person, place, and time.  Psychiatric:        Mood and Affect: Mood normal.        Behavior: Behavior normal.    Assessment & Plan:  1: Chronic heart failure with reduced ejection fraction- - NYHA class II - euvolemic today - weighing sporadically; encouraged to weigh daily and to call for an overnight weight gain of > 2 pounds or a weekly weight gain of > 5 pounds - weight up 10 pounds from last visit here 1 month ago - trying to adhere to low sodium diet, son cooks his meals - has been on furosemide in the past which resulted in hyponatremia - saw cardiology 01/18/2021) 02/11/20 - on GDMT of metoprolol & entresto - palliative care visit done 02/12/21 & has recently been discharged from their services by patient's choice - BNP 01/08/21  was 220.0  2: HTN- - BP mildly elevated (143/87) - saw PCP 03/10/21) 01/22/21 - BMP 04/27/21 reviewed and showed sodium 134, potassium 4.5, creatinine 0.82 and GFR >60 - saw nephrology 04/29/21) 01/29/21  3: Atrial fibrillation- - irregular during exam today although rate controlled - on apixaban and metoprolol  4: Lymphedema- - stage 2 - limited in his ability to exercise due to being unsteady on feet - encouraged to elevate his legs when sitting for long periods of time - needs to cut the band at the top  of his socks - wearing lymphapress compression boots    Medication bottles reviewed  Due to HF stability, will not make a return appointment for patient at this time. Advised patient to follow closely with cardiology and PCP but if he had any questions/ issues, to call us back at anytime. Patient and son were comfortable with this plan.

## 2021-05-26 ENCOUNTER — Encounter: Payer: Self-pay | Admitting: Family

## 2021-05-26 ENCOUNTER — Other Ambulatory Visit: Payer: Self-pay

## 2021-05-26 ENCOUNTER — Ambulatory Visit: Payer: Medicare Other | Attending: Family | Admitting: Family

## 2021-05-26 VITALS — BP 143/87 | HR 81 | Resp 16 | Ht 67.0 in | Wt 180.5 lb

## 2021-05-26 DIAGNOSIS — E871 Hypo-osmolality and hyponatremia: Secondary | ICD-10-CM | POA: Insufficient documentation

## 2021-05-26 DIAGNOSIS — Z79890 Hormone replacement therapy (postmenopausal): Secondary | ICD-10-CM | POA: Insufficient documentation

## 2021-05-26 DIAGNOSIS — I130 Hypertensive heart and chronic kidney disease with heart failure and stage 1 through stage 4 chronic kidney disease, or unspecified chronic kidney disease: Secondary | ICD-10-CM | POA: Insufficient documentation

## 2021-05-26 DIAGNOSIS — Z79899 Other long term (current) drug therapy: Secondary | ICD-10-CM | POA: Insufficient documentation

## 2021-05-26 DIAGNOSIS — I5022 Chronic systolic (congestive) heart failure: Secondary | ICD-10-CM | POA: Diagnosis not present

## 2021-05-26 DIAGNOSIS — E079 Disorder of thyroid, unspecified: Secondary | ICD-10-CM | POA: Insufficient documentation

## 2021-05-26 DIAGNOSIS — Z7901 Long term (current) use of anticoagulants: Secondary | ICD-10-CM | POA: Insufficient documentation

## 2021-05-26 DIAGNOSIS — I4891 Unspecified atrial fibrillation: Secondary | ICD-10-CM | POA: Diagnosis not present

## 2021-05-26 DIAGNOSIS — I480 Paroxysmal atrial fibrillation: Secondary | ICD-10-CM | POA: Diagnosis not present

## 2021-05-26 DIAGNOSIS — E785 Hyperlipidemia, unspecified: Secondary | ICD-10-CM | POA: Insufficient documentation

## 2021-05-26 DIAGNOSIS — I890 Lymphedema, not elsewhere classified: Secondary | ICD-10-CM | POA: Insufficient documentation

## 2021-05-26 DIAGNOSIS — I10 Essential (primary) hypertension: Secondary | ICD-10-CM | POA: Diagnosis not present

## 2021-05-26 DIAGNOSIS — N189 Chronic kidney disease, unspecified: Secondary | ICD-10-CM | POA: Diagnosis not present

## 2021-05-26 DIAGNOSIS — Z7982 Long term (current) use of aspirin: Secondary | ICD-10-CM | POA: Diagnosis not present

## 2021-05-26 NOTE — Patient Instructions (Addendum)
Resume weighing daily and call for an overnight weight gain of > 2 pounds or a weekly weight gain of >5 pounds.    Call us in the future if you need [place] for anything.

## 2021-06-08 NOTE — Progress Notes (Signed)
Name: Kirk Sanchez   MRN: [id number]    DOB: 10-24-1924   Date:06/09/2021       Progress Note  Subjective  Chief Complaint  Follow Up  HPI  CHF/Afib/HTN: he is compliant with his medications, he denies side effects. Denies chest pain, palpitation and only has SOB with moderate activity, denies orthopnea. He was living  alone and was able to do all ADL, however since his admission for CHF back in June he has been staying with his girlfriend or with his middle son [name]. He has three sons.Sees Dr. Juliann Pares, last visit 01/2020 with his NP. Advised son to contact his office for follow up. He recently went to CHF clinic   Hypothyroidism: he gets labs done yearly, taking same dose of a long time, no change in bowel movements of dysphagia.  Last TSH was 4.14 towards high end of normal but he wants to hold off on getting labs today .   Hearing loss: has hearing aid but still has difficulty hearing me   Senile purpura: on arms, he states stable, takes Eliquis and reassurance given   Atherosclerosis of aorta: taking statin therapy, denies side effects, reviewed CT chest done 2017 , he is also on Eliquis   GERD: taking medication prn, states not having problems, no heartburn or indigestion  Hypo osmolality and hyponatremia: seeing Dr. Cherylann Ratel and last sodium was back to normal   Gait instability: using a cane, son thinks because he is weak, he refuses PT  Patient Active Problem List   Diagnosis Date Noted   Hyposmolality and/or hyponatremia 01/27/2021   CHF (congestive heart failure), NYHA class II, chronic, diastolic (HCC) 08/09/2019   Ischemic cardiomyopathy 08/09/2019   Senile purpura (HCC) 02/06/2019   DNR (do not resuscitate)    Right shoulder tendonitis 03/08/2017   Atrial fibrillation (HCC) 03/06/2016   Presbycusis of both ears 03/06/2016   Advance care planning 03/03/2016   Anemia 03/03/2016   Arteriosclerosis of coronary artery 01/28/2015   Cardiac murmur 01/28/2015   HLD  (hyperlipidemia) 01/28/2015   Hypothyroidism 01/28/2015    Past Surgical History:  Procedure Laterality Date   HAND ARTHROPLASTY     HIP ARTHROPLASTY     STENT PLACEMENT RT URETER ([facility] HX)      Family History  Problem Relation Age of Onset   Cancer Mother        stomach   Asthma Father    Stroke Neg Hx    Heart disease Neg Hx     Social History   Tobacco Use   Smoking status: Never   Smokeless tobacco: Never  Substance Use Topics   Alcohol use: No    Alcohol/week: 0.0 standard drinks     Current Outpatient Medications:    apixaban (ELIQUIS) 5 MG TABS tablet, Take 1 tablet (5 mg total) by mouth 2 (two) times daily., Disp: 180 tablet, Rfl: 3   levothyroxine (SYNTHROID) 25 MCG tablet, Take 1 tablet (25 mcg total) by mouth daily before breakfast., Disp: 90 tablet, Rfl: 3   metoprolol succinate (TOPROL-XL) 25 MG 24 hr tablet, Take 1 tablet (25 mg total) by mouth daily at 6 PM., Disp: 90 tablet, Rfl: 3   sacubitril-valsartan (ENTRESTO) 24-26 MG, Take 1 tablet by mouth 2 (two) times daily., Disp: 60 tablet, Rfl: 3   simvastatin (ZOCOR) 40 MG tablet, TAKE 1 TABLET BY MOUTH AT BEDTIME, Disp: 90 tablet, Rfl: 1   Elastic Bandages & Supports (MEDICAL COMPRESSION STOCKINGS) MISC, Put on both legs  in the morning, take off at night; do NOT sleep in them (Patient not taking: No sig reported), Disp: 2 each, Rfl: 1  No Known Allergies  I personally reviewed active problem list, medication list, allergies, family history, social history, health maintenance with the patient/caregiver today.   ROS  Constitutional: Negative for fever or weight change.  Respiratory: Negative for cough , positive for mild  shortness of breath.   Cardiovascular: Negative for chest pain or palpitations.  Gastrointestinal: Negative for abdominal pain, no bowel changes.  Musculoskeletal: Positive for gait problem but no  joint swelling.  Skin: Negative for rash.  Neurological: Negative for dizziness or  headache.  No other specific complaints in a complete review of systems (except as listed in HPI above).   Objective  Vitals:   06/09/21 1323  BP: 138/70  Pulse: 69  Resp: 16  Temp: (!) 97.5 F (36.4 C)  TempSrc: Oral  SpO2: 99%  Weight: 178 lb 12.8 oz (81.1 kg)  Height: 5\' 7"  (1.702 m)    Body mass index is 28 kg/m.  Physical Exam  Constitutional: Patient appears well-developed and well-nourished.  No distress.  HEENT: head atraumatic, normocephalic, pupils equal and reactive to light, neck supple Cardiovascular: Normal rate, regular rhythm and normal heart sounds.  No murmur heard. 1 plus BLE edema. Pulmonary/Chest: Effort normal and breath sounds normal. No respiratory distress. Abdominal: Soft.  There is no tenderness. Psychiatric: Patient has a normal mood and affect. behavior is normal. Judgment and thought content normal.   Recent Results (from the past 2160 hour(s))  Basic metabolic panel     Status: Abnormal   Collection Time: 04/27/21 12:47 PM  Result Value Ref Range   Sodium 134 (L) 135 - 145 mmol/L   Potassium 4.5 3.5 - 5.1 mmol/L   Chloride 95 (L) 98 - 111 mmol/L   CO2 32 22 - 32 mmol/L   Glucose, Bld 111 (H) 70 - 99 mg/dL    Comment: Glucose reference range applies only to samples taken after fasting for at least 8 hours.   BUN 22 8 - 23 mg/dL   Creatinine, Ser 04/29/21 0.61 - 1.24 mg/dL   Calcium 9.0 8.9 - 4.33 mg/dL   GFR, Estimated 29.5 >18 mL/min    Comment: (NOTE) Calculated using the CKD-EPI Creatinine Equation (2021)    Anion gap 7 5 - 15    Comment: Performed at [facility], [address] [place], [place]     PHQ2/9: Depression screen [facility] 2/9 06/09/2021 05/26/2021 01/22/2021 12/05/2020 07/15/2020  Decreased Interest 0 0 0 0 0  Down, Depressed, Hopeless 0 0 0 0 0  PHQ - 2 Score 0 0 0 0 0  Altered sleeping 0 - - - -  Tired, decreased energy 0 - - - -  Change in appetite 0 - - - -  Feeling bad or failure about yourself  0 -  - - -  Trouble concentrating 0 - - - -  Moving slowly or fidgety/restless 0 - - - -  Suicidal thoughts 0 - - - -  PHQ-9 Score 0 - - - -  Difficult doing work/chores - - - - -  Some recent data might be hidden    phq 9 is negative   Fall Risk: Fall Risk  06/09/2021 05/26/2021 04/27/2021 03/19/2021 01/22/2021  Falls in the past year? 0 0 0 0 0  Number falls in past yr: 0 0 0 0 0  Injury with Fall? 0  0 0 0 0  Risk for fall due to : Impaired balance/gait Impaired balance/gait - Impaired mobility Impaired balance/gait;Impaired mobility  Follow up Falls prevention discussed Falls evaluation completed;Falls prevention discussed Falls evaluation completed Falls evaluation completed Falls prevention discussed      Functional Status Survey: Is the patient deaf or have difficulty hearing?: Yes Does the patient have difficulty seeing, even when wearing glasses/contacts?: No Does the patient have difficulty concentrating, remembering, or making decisions?: No Does the patient have difficulty walking or climbing stairs?: Yes Does the patient have difficulty dressing or bathing?: No Does the patient have difficulty doing errands alone such as visiting a doctor's office or shopping?: Yes    Assessment & Plan  1. Paroxysmal atrial fibrillation (HCC)  Rate controlled   2. Chronic combined systolic and diastolic congestive heart failure (HCC)  Taking medications as prescribed   3. Senile purpura (HCC)   4. Atherosclerosis of aorta (HCC)  On statin therapy and Eliquis   5. GERD without esophagitis  Taking prn medication  6. Hypothyroidism, unspecified type   7. Arteriosclerosis of coronary artery   8. Mixed hyperlipidemia  On simvastatin  9. Anemia, unspecified type  We will recheck next time   10. Need for immunization against influenza  - Flu Vaccine QUAD High Dose(Fluad)   11. Gait instability  Discussed PT but he refused

## 2021-06-09 ENCOUNTER — Other Ambulatory Visit: Payer: Self-pay

## 2021-06-09 ENCOUNTER — Encounter: Payer: Self-pay | Admitting: Family Medicine

## 2021-06-09 ENCOUNTER — Ambulatory Visit (INDEPENDENT_AMBULATORY_CARE_PROVIDER_SITE_OTHER): Payer: Medicare Other | Admitting: Family Medicine

## 2021-06-09 VITALS — BP 138/70 | HR 69 | Temp 97.5°F | Resp 16 | Ht 67.0 in | Wt 178.8 lb

## 2021-06-09 DIAGNOSIS — I2510 Atherosclerotic heart disease of native coronary artery without angina pectoris: Secondary | ICD-10-CM | POA: Diagnosis not present

## 2021-06-09 DIAGNOSIS — Z23 Encounter for immunization: Secondary | ICD-10-CM | POA: Diagnosis not present

## 2021-06-09 DIAGNOSIS — I700 Atherosclerosis of aorta: Secondary | ICD-10-CM

## 2021-06-09 DIAGNOSIS — E782 Mixed hyperlipidemia: Secondary | ICD-10-CM | POA: Diagnosis not present

## 2021-06-09 DIAGNOSIS — D692 Other nonthrombocytopenic purpura: Secondary | ICD-10-CM | POA: Diagnosis not present

## 2021-06-09 DIAGNOSIS — D649 Anemia, unspecified: Secondary | ICD-10-CM | POA: Diagnosis not present

## 2021-06-09 DIAGNOSIS — E039 Hypothyroidism, unspecified: Secondary | ICD-10-CM

## 2021-06-09 DIAGNOSIS — I5042 Chronic combined systolic (congestive) and diastolic (congestive) heart failure: Secondary | ICD-10-CM

## 2021-06-09 DIAGNOSIS — R2681 Unsteadiness on feet: Secondary | ICD-10-CM

## 2021-06-09 DIAGNOSIS — K219 Gastro-esophageal reflux disease without esophagitis: Secondary | ICD-10-CM | POA: Diagnosis not present

## 2021-06-09 DIAGNOSIS — I480 Paroxysmal atrial fibrillation: Secondary | ICD-10-CM

## 2021-06-18 DIAGNOSIS — Z23 Encounter for immunization: Secondary | ICD-10-CM | POA: Diagnosis not present

## 2021-07-16 ENCOUNTER — Ambulatory Visit (INDEPENDENT_AMBULATORY_CARE_PROVIDER_SITE_OTHER): Payer: Medicare Other

## 2021-07-16 VITALS — BP 110/66 | HR 68 | Resp 16 | Ht 67.0 in | Wt 175.8 lb

## 2021-07-16 DIAGNOSIS — Z0000 Encounter for general adult medical examination without abnormal findings: Secondary | ICD-10-CM

## 2021-07-16 NOTE — Progress Notes (Signed)
Subjective:   Kirk Sanchez is a 87 y.o. male who presents for Medicare Annual/Subsequent preventive examination.  Review of Systems     Cardiac Risk Factors include: advanced age (>78men, >85 women);dyslipidemia;hypertension;male gender;sedentary lifestyle     Objective:    Today's Vitals   07/16/21 1502  BP: 110/66  Pulse: 68  Resp: 16  SpO2: 94%  Weight: 175 lb 12.8 oz (79.7 kg)  Height: 5\' 7"  (1.702 m)   Body mass index is 27.53 kg/m.  Advanced Directives 07/16/2021 01/09/2021 01/08/2021 07/15/2020 10/06/2018 09/10/2017 03/08/2017  Does Patient Have a Medical Advance Directive? Yes No No Yes Yes No Yes  Type of 05/08/2017 of [place];Living will - - Healthcare Power of [place];Living will Living will;Healthcare Power of Attorney - -  Copy of Healthcare Power of Attorney in Chart? Yes - validated most recent copy scanned in chart (See row information) - - No - copy requested No - copy requested - -  Would patient like information on creating a medical advance directive? - No - Patient declined - - - No - Patient declined -    Current Medications (verified) Outpatient Encounter Medications as of 07/16/2021  Medication Sig   apixaban (ELIQUIS) 5 MG TABS tablet Take 1 tablet (5 mg total) by mouth 2 (two) times daily.   Elastic Bandages & Supports (MEDICAL COMPRESSION STOCKINGS) MISC Put on both legs in the morning, take off at night; do NOT sleep in them   levothyroxine (SYNTHROID) 25 MCG tablet Take 1 tablet (25 mcg total) by mouth daily before breakfast.   metoprolol succinate (TOPROL-XL) 25 MG 24 hr tablet Take 1 tablet (25 mg total) by mouth daily at 6 PM.   sacubitril-valsartan (ENTRESTO) 24-26 MG Take 1 tablet by mouth 2 (two) times daily.   simvastatin (ZOCOR) 40 MG tablet TAKE 1 TABLET BY MOUTH AT BEDTIME   No facility-administered encounter medications on file as of 07/16/2021.    Allergies (verified) Patient has no known allergies.    History: Past Medical History:  Diagnosis Date   Atrial fibrillation (HCC)    CHF (congestive heart failure) (HCC)    Hyperlipidemia    Hypertension    Kidney stone    Presbycusis of both ears 03/06/2016   Thyroid disease    Past Surgical History:  Procedure Laterality Date   HAND ARTHROPLASTY     HIP ARTHROPLASTY     STENT PLACEMENT RT URETER ([facility] HX)     Family History  Problem Relation Age of Onset   Cancer Mother        stomach   Asthma Father    Stroke Neg Hx    Heart disease Neg Hx    Social History   Socioeconomic History   Marital status: Widowed    Spouse name: Not on file   Number of children: 3   Years of education: Not on file   Highest education level: High school graduate  Occupational History   Occupation: Retired  Tobacco Use   Smoking status: Never   Smokeless tobacco: Never  Vaping Use   Vaping Use: Never used  Substance and Sexual Activity   Alcohol use: No    Alcohol/week: 0.0 standard drinks   Drug use: No   Sexual activity: Not Currently  Other Topics Concern   Not on file  Social History Narrative   Pt lives alone but currently staying with a friend or his son 05/06/2016 since last hospital admission   Social Determinants of  Health   Financial Resource Strain: Low Risk    Difficulty of Paying Living Expenses: Not hard at all  Food Insecurity: No Food Insecurity   Worried About Running Out of Food in the Last Year: Never true   Ran Out of Food in the Last Year: Never true  Transportation Needs: No Transportation Needs   Lack of Transportation (Medical): No   Lack of Transportation (Non-Medical): No  Physical Activity: Inactive   Days of Exercise per Week: 0 days   Minutes of Exercise per Session: 0 min  Stress: No Stress Concern Present   Feeling of Stress : Not at all  Social Connections: Moderately Isolated   Frequency of Communication with Friends and Family: More than three times a week   Frequency of Social Gatherings  with Friends and Family: More than three times a week   Attends Religious Services: More than 4 times per year   Active Member of [employer] or Organizations: No   Attends Banker Meetings: Never   Marital Status: Widowed    Tobacco Counseling Counseling given: Not Answered   Clinical Intake:  Pre-visit preparation completed: Yes  Pain : No/denies pain     BMI - recorded: 27.53 Nutritional Status: BMI 25 -29 Overweight Nutritional Risks: None Diabetes: No  How often do you need to have someone help you when you read instructions, pamphlets, or other written materials from your doctor or pharmacy?: 1 - Never    Interpreter Needed?: No  Information entered by :: Reather Littler LPN   Activities of Daily Living In your present state of health, do you have any difficulty performing the following activities: 07/16/2021 06/09/2021  Hearing? Malvin Johns  Vision? N N  Difficulty concentrating or making decisions? N N  Walking or climbing stairs? Y Y  Dressing or bathing? N N  Doing errands, shopping? Malvin Johns  Preparing Food and eating ? N -  Using the Toilet? N -  In the past six months, have you accidently leaked urine? N -  Do you have problems with loss of bowel control? N -  Managing your Medications? N -  Managing your Finances? N -  Housekeeping or managing your Housekeeping? N -  Some recent data might be hidden    Patient Care Team: Margarita Mail, DO as PCP - General (Internal Medicine)  Indicate any recent Medical Services you may have received from other than [facility] providers in the past year (date may be approximate).     Assessment:   This is a routine wellness examination for [facility].  Hearing/Vision screen Hearing Screening - Comments:: Wears hearing aids Vision Screening - Comments:: [facility] screenings done at [facility]; due for exam  Dietary issues and exercise activities discussed: Current Exercise Habits: The patient does not  participate in regular exercise at present, Exercise limited by: orthopedic condition(s)   Goals Addressed             This Visit's Progress    DIET - INCREASE WATER INTAKE   On track    Recommend drinking 6-8 glasses of water per day       Depression Screen PHQ 2/9 Scores 07/16/2021 06/09/2021 05/26/2021 01/22/2021 12/05/2020 07/15/2020 05/02/2020  PHQ - 2 Score 0 0 0 0 0 0 0  PHQ- 9 Score - 0 - - - - -    Fall Risk Fall Risk  07/16/2021 06/09/2021 05/26/2021 04/27/2021 03/19/2021  Falls in the past year? 0 0 0 0 0  Number  falls in past yr: 0 0 0 0 0  Injury with Fall? 0 0 0 0 0  Risk for fall due to : Impaired balance/gait Impaired balance/gait Impaired balance/gait - Impaired mobility  Follow up Falls prevention discussed Falls prevention discussed Falls evaluation completed;Falls prevention discussed Falls evaluation completed Falls evaluation completed    FALL RISK PREVENTION PERTAINING TO THE HOME:  Any stairs in or around the home? Yes  If so, are there any without handrails? No  Home free of loose throw rugs in walkways, pet beds, electrical cords, etc? Yes  Adequate lighting in your home to reduce risk of falls? Yes   ASSISTIVE DEVICES UTILIZED TO PREVENT FALLS:  Life alert? No  Use of a cane, walker or w/c? Yes  Grab bars in the bathroom? No  Shower chair or bench in shower? No  Elevated toilet seat or a handicapped toilet? No   TIMED UP AND GO:  Was the test performed? Yes .  Length of time to ambulate 10 feet: 8 sec.   Gait slow and steady with assistive device  Cognitive Function:     6CIT Screen 10/06/2018  What Year? 4 points  What month? 0 points  What time? 0 points  Count back from 20 0 points  Months in reverse 4 points  Repeat phrase 2 points  Total Score 10    Immunizations Immunization History  Administered Date(s) Administered   Fluad Quad(high Dose 65+) 05/02/2020, 06/09/2021   Influenza, High Dose Seasonal PF 06/09/2015, 09/08/2016,  09/08/2017, 08/08/2018   PFIZER(Purple Top)SARS-COV-2 Vaccination 08/18/2019, 09/10/2019   Pneumococcal Conjugate-13 09/26/2014   Pneumococcal Polysaccharide-23 06/13/2012   Tdap 02/02/2011    TDAP status: Due, Education has been provided regarding the importance of this vaccine. Advised may receive this vaccine at local pharmacy or Health Dept. Aware to provide a copy of the vaccination record if obtained from local pharmacy or Health Dept. Verbalized acceptance and understanding.  Flu Vaccine status: Up to date  Pneumococcal vaccine status: Up to date  Covid-19 vaccine status: Completed vaccines  Qualifies for Shingles Vaccine? Yes   Zostavax completed No   Shingrix Completed?: No.    Education has been provided regarding the importance of this vaccine. Patient has been advised to call insurance company to determine out of pocket expense if they have not yet received this vaccine. Advised may also receive vaccine at local pharmacy or Health Dept. Verbalized acceptance and understanding.  Screening Tests Health Maintenance  Topic Date Due   Zoster Vaccines- Shingrix (1 of 2) Never done   COVID-19 Vaccine (3 - Booster for Pfizer series) 11/05/2019   TETANUS/TDAP  02/01/2021   Pneumonia Vaccine 86+ Years old  Completed   INFLUENZA VACCINE  Completed   HPV VACCINES  Aged Out    Health Maintenance  Health Maintenance Due  Topic Date Due   Zoster Vaccines- Shingrix (1 of 2) Never done   COVID-19 Vaccine (3 - Booster for Pfizer series) 11/05/2019   TETANUS/TDAP  02/01/2021    Colorectal cancer screening: No longer required.   Lung Cancer Screening: (Low Dose CT Chest recommended if Age 54-80 years, 30 pack-year currently smoking OR have quit w/in 15years.) does not qualify.   Additional Screening:  Hepatitis C Screening: does not qualify;   Vision Screening: Recommended annual ophthalmology exams for early detection of glaucoma and other disorders of the eye. Is the patient  up to date with their annual eye exam?  No  Who is the provider or what  is the name of the office in which the patient attends annual eye exams? [facility].   Dental Screening: Recommended annual dental exams for proper oral hygiene  Community Resource Referral / Chronic Care Management: CRR required this visit?  No   CCM required this visit?  No      Plan:     I have personally reviewed and noted the following in the patients chart:   Medical and social history Use of alcohol, tobacco or illicit drugs  Current medications and supplements including opioid prescriptions. Patient is not currently taking opioid prescriptions. Functional ability and status Nutritional status Physical activity Advanced directives List of other physicians Hospitalizations, surgeries, and ER visits in previous 12 months Vitals Screenings to include cognitive, depression, and falls Referrals and appointments  In addition, I have reviewed and discussed with patient certain preventive protocols, quality metrics, and best practice recommendations. A written personalized care plan for preventive services as well as general preventive health recommendations were provided to patient.     Reather Littler, LPN   [id number]   Nurse Notes: pt accompanied to visit today by his son [name]; pt states he is doing well with no problems

## 2021-07-16 NOTE — Patient Instructions (Signed)
Kirk Sanchez , Thank you for taking time to come for your Medicare Wellness Visit. I appreciate your ongoing commitment to your health goals. Please review the following plan we discussed and let me know if I can assist you in the future.   Screening recommendations/referrals: Colonoscopy: no longer required Recommended yearly ophthalmology/optometry visit for glaucoma screening and checkup Recommended yearly dental visit for hygiene and checkup  Vaccinations: Influenza vaccine: done 06/09/21 Pneumococcal vaccine: done 09/26/14 Tdap vaccine: due Shingles vaccine: Shingrix discussed. Please contact your pharmacy for coverage information.  Covid-19:  done 08/18/19 & 09/10/19; please bring a copy of your vaccine record to your next appt for booster information  Conditions/risks identified: Recommend continuing fall prevention at home  Next appointment: Follow up in one year for your annual wellness visit.   Preventive Care 84 Years and Older, Male Preventive care refers to lifestyle choices and visits with your health care provider that can promote health and wellness. What does preventive care include? A yearly physical exam. This is also called an annual well check. Dental exams once or twice a year. Routine eye exams. Ask your health care provider how often you should have your eyes checked. Personal lifestyle choices, including: Daily care of your teeth and gums. Regular physical activity. Eating a healthy diet. Avoiding tobacco and drug use. Limiting alcohol use. Practicing safe sex. Taking low doses of aspirin every day. Taking vitamin and mineral supplements as recommended by your health care provider. What happens during an annual well check? The services and screenings done by your health care provider during your annual well check will depend on your age, overall health, lifestyle risk factors, and family history of disease. Counseling  Your health care provider may ask you  questions about your: Alcohol use. Tobacco use. Drug use. Emotional well-being. Home and relationship well-being. Sexual activity. Eating habits. History of falls. Memory and ability to understand (cognition). Work and work Astronomer. Screening  You may have the following tests or measurements: Height, weight, and BMI. Blood pressure. Lipid and cholesterol levels. These may be checked every 5 years, or more frequently if you are over 80 years old. Skin check. Lung cancer screening. You may have this screening every year starting at age 56 if you have a 30-pack-year history of smoking and currently smoke or have quit within the past 15 years. Fecal occult blood test (FOBT) of the stool. You may have this test every year starting at age 27. Flexible sigmoidoscopy or colonoscopy. You may have a sigmoidoscopy every 5 years or a colonoscopy every 10 years starting at age 39. Prostate cancer screening. Recommendations will vary depending on your family history and other risks. Hepatitis C blood test. Hepatitis B blood test. Sexually transmitted disease (STD) testing. Diabetes screening. This is done by checking your blood sugar (glucose) after you have not eaten for a while (fasting). You may have this done every 1-3 years. Abdominal aortic aneurysm (AAA) screening. You may need this if you are a current or former smoker. Osteoporosis. You may be screened starting at age 64 if you are at high risk. Talk with your health care provider about your test results, treatment options, and if necessary, the need for more tests. Vaccines  Your health care provider may recommend certain vaccines, such as: Influenza vaccine. This is recommended every year. Tetanus, diphtheria, and acellular pertussis (Tdap, Td) vaccine. You may need a Td booster every 10 years. Zoster vaccine. You may need this after age 47. Pneumococcal 13-valent conjugate (PCV13)  vaccine. One dose is recommended after age  98. Pneumococcal polysaccharide (PPSV23) vaccine. One dose is recommended after age 58. Talk to your health care provider about which screenings and vaccines you need and how often you need them. This information is not intended to replace advice given to you by your health care provider. Make sure you discuss any questions you have with your health care provider. Document Released: 08/15/2015 Document Revised: 04/07/2016 Document Reviewed: 05/20/2015 Elsevier Interactive Patient Education  2017 [place] Prevention in the Home Falls can cause injuries. They can happen to people of all ages. There are many things you can do to make your home safe and to help prevent falls. What can I do on the outside of my home? Regularly fix the edges of walkways and driveways and fix any cracks. Remove anything that might make you trip as you walk through a door, such as a raised step or threshold. Trim any bushes or trees on the path to your home. Use bright outdoor lighting. Clear any walking paths of anything that might make someone trip, such as rocks or tools. Regularly check to see if handrails are loose or broken. Make sure that both sides of any steps have handrails. Any raised decks and porches should have guardrails on the edges. Have any leaves, snow, or ice cleared regularly. Use sand or salt on walking paths during winter. Clean up any spills in your garage right away. This includes oil or grease spills. What can I do in the bathroom? Use night lights. Install grab bars by the toilet and in the tub and shower. Do not use towel bars as grab bars. Use non-skid mats or decals in the tub or shower. If you need to sit down in the shower, use a plastic, non-slip stool. Keep the floor dry. Clean up any water that spills on the floor as soon as it happens. Remove soap buildup in the tub or shower regularly. Attach bath mats securely with double-sided non-slip rug tape. Do not have throw  rugs and other things on the floor that can make you trip. What can I do in the bedroom? Use night lights. Make sure that you have a light by your bed that is easy to reach. Do not use any sheets or blankets that are too big for your bed. They should not hang down onto the floor. Have a firm chair that has side arms. You can use this for support while you get dressed. Do not have throw rugs and other things on the floor that can make you trip. What can I do in the kitchen? Clean up any spills right away. Avoid walking on wet floors. Keep items that you use a lot in easy-to-reach places. If you need to reach something above you, use a strong step stool that has a grab bar. Keep electrical cords out of the way. Do not use floor polish or wax that makes floors slippery. If you must use wax, use non-skid floor wax. Do not have throw rugs and other things on the floor that can make you trip. What can I do with my stairs? Do not leave any items on the stairs. Make sure that there are handrails on both sides of the stairs and use them. Fix handrails that are broken or loose. Make sure that handrails are as long as the stairways. Check any carpeting to make sure that it is firmly attached to the stairs. Fix any carpet that is loose  or worn. Avoid having throw rugs at the top or bottom of the stairs. If you do have throw rugs, attach them to the floor with carpet tape. Make sure that you have a light switch at the top of the stairs and the bottom of the stairs. If you do not have them, ask someone to add them for you. What else can I do to help prevent falls? Wear shoes that: Do not have high heels. Have rubber bottoms. Are comfortable and fit you well. Are closed at the toe. Do not wear sandals. If you use a stepladder: Make sure that it is fully opened. Do not climb a closed stepladder. Make sure that both sides of the stepladder are locked into place. Ask someone to hold it for you, if  possible. Clearly mark and make sure that you can see: Any grab bars or handrails. First and last steps. Where the edge of each step is. Use tools that help you move around (mobility aids) if they are needed. These include: Canes. Walkers. Scooters. Crutches. Turn on the lights when you go into a dark area. Replace any light bulbs as soon as they burn out. Set up your furniture so you have a clear path. Avoid moving your furniture around. If any of your floors are uneven, fix them. If there are any pets around you, be aware of where they are. Review your medicines with your doctor. Some medicines can make you feel dizzy. This can increase your chance of falling. Ask your doctor what other things that you can do to help prevent falls. This information is not intended to replace advice given to you by your health care provider. Make sure you discuss any questions you have with your health care provider. Document Released: 05/15/2009 Document Revised: 12/25/2015 Document Reviewed: 08/23/2014 Elsevier Interactive Patient Education  2017 [employer].

## 2021-12-04 NOTE — Progress Notes (Signed)
Name: Kirk Sanchez   MRN: [id number]    DOB: Dec 01, 1924   Date:12/07/2021 ? ?     Progress Note ? ?Subjective ? ?Chief Complaint ? ?Follow Up ? ?HPI ? ?CHF/Afib/HTN: he stopped taking all medications a few weeks ago because he was feeling dizzy . His son took his to his house and has been feeling him and he is feeling better, bp today is still low. Advised to resume Eliquis , simvastatin but stay off Entresto and Toprol for now.  Denies chest pain, palpitation and only has SOB with moderate activity, denies orthopnea. He was living  alone and was able to do all ADL, however since his admission for CHF back in June 22  he has been staying with his girlfriend or with his middle son - Iantha FallenKenneth ( currently at [facility] house)  He has three sons. He used to see  Dr. Juliann Paresallwood, but last visit 01/2020 , seen by [facility] Summer 2022 .  ? ?Hypothyroidism: he gets labs done yearly, taking same dose of a long time, no change in bowel movements of dysphagia.  He stopped taking all medications a few weeks ago, advised to resume medication and return for recheck labs in 6 weeks.  ? ?Hearing loss: has hearing aid but still has difficulty hearing me - we had to repeat questions loudly  ? ?Senile purpura: on arms, he states stable, takes Eliquis and reassurance given  ? ?Atherosclerosis of aorta: taking statin therapy, denies side effects, reviewed CT chest done 2017 , he stopped eliquis and simvastatin on his own because he thought it was causing dizziness, advised to resume eliquis  ? ?GERD: he is off medication and is doing well , states not having problems, at this time  ? ?Hypo osmolality and hyponatremia: he saw Dr. Cherylann RatelLateef and last sodium was back to normal  ? ?Gait instability: he used to use a cane but is now using a walker,  son thinks because he is weak, he is not interested in PT  ? ?Senile purpura: both arms, reassurance given  ? ?Patient Active Problem List  ? Diagnosis Date Noted  ? Hyposmolality and/or  hyponatremia 01/27/2021  ? CHF (congestive heart failure), NYHA class II, chronic, diastolic (HCC) 08/09/2019  ? Ischemic cardiomyopathy 08/09/2019  ? Senile purpura (HCC) 02/06/2019  ? DNR (do not resuscitate)   ? Right shoulder tendonitis 03/08/2017  ? Atrial fibrillation (HCC) 03/06/2016  ? Presbycusis of both ears 03/06/2016  ? Advance care planning 03/03/2016  ? Anemia 03/03/2016  ? Arteriosclerosis of coronary artery 01/28/2015  ? Cardiac murmur 01/28/2015  ? HLD (hyperlipidemia) 01/28/2015  ? Hypothyroidism 01/28/2015  ? ? ?Past Surgical History:  ?Procedure Laterality Date  ? HAND ARTHROPLASTY    ? HIP ARTHROPLASTY    ? STENT PLACEMENT RT URETER ([facility] HX)    ? ? ?Family History  ?Problem Relation Age of Onset  ? Cancer Mother   ?     stomach  ? Asthma Father   ? Stroke Neg Hx   ? Heart disease Neg Hx   ? ? ?Social History  ? ?Tobacco Use  ? Smoking status: Never  ? Smokeless tobacco: Never  ?Substance Use Topics  ? Alcohol use: No  ?  Alcohol/week: 0.0 standard drinks  ? ? ? ?Current Outpatient Medications:  ?  furosemide (LASIX) 20 MG tablet, Take 1 tablet (20 mg total) by mouth daily as needed. Increase sob , weight gain or swelling, Disp: 30 tablet, Rfl:  0 ?  levothyroxine (SYNTHROID) 25 MCG tablet, Take 1 tablet (25 mcg total) by mouth daily before breakfast., Disp: 90 tablet, Rfl: 3 ?  potassium chloride SA (KLOR-CON M) 20 MEQ tablet, Take 1 tablet (20 mEq total) by mouth daily as needed. Only when taking furosemide, Disp: 30 tablet, Rfl: 3 ?  Tdap (ADACEL) 12-01-13.5 LF-MCG/0.5 injection, Inject 0.5 mLs into the muscle once for 1 dose., Disp: 0.5 mL, Rfl: 0 ?  Zoster Vaccine Adjuvanted [facility]) injection, Inject 0.5 mLs into the muscle once for 1 dose., Disp: 0.5 mL, Rfl: 0 ?  apixaban (ELIQUIS) 5 MG TABS tablet, Take 1 tablet (5 mg total) by mouth 2 (two) times daily. (Patient not taking: Reported on 12/07/2021), Disp: 180 tablet, Rfl: 3 ?  simvastatin (ZOCOR) 40 MG tablet, TAKE 1 TABLET BY MOUTH AT  BEDTIME (Patient not taking: Reported on 12/07/2021), Disp: 90 tablet, Rfl: 1 ? ?No Known Allergies ? ?I personally reviewed active problem list, medication list, allergies, family history, social history, health maintenance with the patient/caregiver today. ? ? ?ROS ? ?Ten systems reviewed and is negative except as mentioned in HPI  ? ?Objective ? ?Vitals:  ? 12/07/21 1313  ?BP: 112/74  ?Pulse: 70  ?Resp: 16  ?SpO2: 98%  ?Weight: 177 lb (80.3 kg)  ?Height: 5\' 7"  (1.702 m)  ? ? ?Body mass index is 27.72 kg/m?. ? ?Physical Exam ? ?Constitutional: Patient appears frail and pale  No distress.  ?HEENT: head atraumatic, normocephalic, pupils equal and reactive to light, neck supple, ?Cardiovascular: Normal rate, irregular rhythm and normal heart sounds.  No murmur heard. 3 plus  BLE edema. ?Pulmonary/Chest: Effort normal and breath sounds normal. No respiratory distress. ?Abdominal: Soft.  There is no tenderness. ?Psychiatric: Patient has a normal mood and affect. behavior is normal. Judgment and thought content normal.  ? ? ?PHQ2/9: ? ?  12/07/2021  ?  1:13 PM 07/16/2021  ?  3:05 PM 06/09/2021  ?  1:11 PM 05/26/2021  ? 12:26 PM 01/22/2021  ?  3:05 PM  ?Depression screen PHQ 2/9  ?Decreased Interest 0 0 0 0 0  ?Down, Depressed, Hopeless 0 0 0 0 0  ?PHQ - 2 Score 0 0 0 0 0  ?Altered sleeping 0  0    ?Tired, decreased energy 0  0    ?Change in appetite 2  0    ?Feeling bad or failure about yourself  0  0    ?Trouble concentrating 0  0    ?Moving slowly or fidgety/restless 0  0    ?Suicidal thoughts 0  0    ?PHQ-9 Score 2  0    ?  ?phq 9 is negative ? ? ?Fall Risk: ? ?  12/07/2021  ?  1:13 PM 07/16/2021  ?  3:08 PM 06/09/2021  ?  1:11 PM 05/26/2021  ? 12:26 PM 04/27/2021  ? 11:38 AM  ?Fall Risk   ?Falls in the past year? 0 0 0 0 0  ?Number falls in past yr: 0 0 0 0 0  ?Injury with Fall? 0 0 0 0 0  ?Risk for fall due to : No Fall Risks Impaired balance/gait Impaired balance/gait Impaired balance/gait   ?Follow up Falls prevention  discussed Falls prevention discussed Falls prevention discussed Falls evaluation completed;Falls prevention discussed Falls evaluation completed  ? ? ? ? ?Functional Status Survey: ?Is the patient deaf or have difficulty hearing?: Yes ?Does the patient have difficulty seeing, even when wearing glasses/contacts?: No ?Does the patient  have difficulty concentrating, remembering, or making decisions?: No ?Does the patient have difficulty walking or climbing stairs?: Yes ?Does the patient have difficulty dressing or bathing?: No ?Does the patient have difficulty doing errands alone such as visiting a doctor's office or shopping?: Yes ? ? ? ?Assessment & Plan ? ?Problem List Items Addressed This Visit   ? ? Hypothyroidism (Chronic)  ?  Off levothyroxine for 3 weeks, resume medication and recheck in 6 weeks  ? ?  ?  ? Atrial fibrillation (HCC) (Chronic)  ?  Rate controlled  ? ?  ?  ? Relevant Medications  ? furosemide (LASIX) 20 MG tablet  ? Anemia  ?  Looks pale, we will recheck labs  ? ?  ?  ? Relevant Orders  ? CBC with Differential/Platelet  ? Iron, TIBC and Ferritin Panel  ? Senile purpura (HCC) - Primary  ?  Both arms ? ?  ?  ? Relevant Medications  ? furosemide (LASIX) 20 MG tablet  ? Chronic combined systolic and diastolic congestive heart failure (HCC)  ?  He stopped Entresto and metoprolol on his own a few weeks ago. BP is low legs are swollen and he has crackles on his lungs ?We will resume statin therapy and Eliquis ?Add Furosemide and potassium to take prn  ? ?  ?  ? Relevant Medications  ? furosemide (LASIX) 20 MG tablet  ? potassium chloride SA (KLOR-CON M) 20 MEQ tablet  ? Other Relevant Orders  ? CBC with Differential/Platelet  ? COMPLETE METABOLIC PANEL WITH GFR  ? Atherosclerosis of aorta (HCC)  ? Relevant Medications  ? furosemide (LASIX) 20 MG tablet  ? Other Relevant Orders  ? Lipid panel  ? Arteriosclerosis of coronary artery  ? Relevant Medications  ? furosemide (LASIX) 20 MG tablet  ? Other  Relevant Orders  ? Lipid panel  ? ?Other Visit Diagnoses   ? ? GERD without esophagitis      ? Need for Tdap vaccination      ? Relevant Medications  ? Tdap (ADACEL) 12-01-13.5 LF-MCG/0.5 injection  ? Need for shingles

## 2021-12-07 ENCOUNTER — Ambulatory Visit (INDEPENDENT_AMBULATORY_CARE_PROVIDER_SITE_OTHER): Payer: Medicare Other | Admitting: Family Medicine

## 2021-12-07 ENCOUNTER — Encounter: Payer: Self-pay | Admitting: Family Medicine

## 2021-12-07 ENCOUNTER — Other Ambulatory Visit: Payer: Self-pay | Admitting: Family Medicine

## 2021-12-07 VITALS — BP 112/74 | HR 70 | Resp 16 | Ht 67.0 in | Wt 177.0 lb

## 2021-12-07 DIAGNOSIS — D692 Other nonthrombocytopenic purpura: Secondary | ICD-10-CM | POA: Diagnosis not present

## 2021-12-07 DIAGNOSIS — D649 Anemia, unspecified: Secondary | ICD-10-CM | POA: Diagnosis not present

## 2021-12-07 DIAGNOSIS — I700 Atherosclerosis of aorta: Secondary | ICD-10-CM

## 2021-12-07 DIAGNOSIS — I2510 Atherosclerotic heart disease of native coronary artery without angina pectoris: Secondary | ICD-10-CM | POA: Diagnosis not present

## 2021-12-07 DIAGNOSIS — I480 Paroxysmal atrial fibrillation: Secondary | ICD-10-CM

## 2021-12-07 DIAGNOSIS — K219 Gastro-esophageal reflux disease without esophagitis: Secondary | ICD-10-CM | POA: Diagnosis not present

## 2021-12-07 DIAGNOSIS — I5042 Chronic combined systolic (congestive) and diastolic (congestive) heart failure: Secondary | ICD-10-CM

## 2021-12-07 DIAGNOSIS — Z23 Encounter for immunization: Secondary | ICD-10-CM

## 2021-12-07 DIAGNOSIS — E039 Hypothyroidism, unspecified: Secondary | ICD-10-CM | POA: Diagnosis not present

## 2021-12-07 MED ORDER — POTASSIUM CHLORIDE CRYS ER 20 MEQ PO TBCR: 20.0000 meq | EXTENDED_RELEASE_TABLET | Freq: Every day | ORAL | 3 refills | Status: DC | PRN

## 2021-12-07 MED ORDER — FUROSEMIDE 20 MG PO TABS: 20.0000 mg | ORAL_TABLET | Freq: Every day | ORAL | 0 refills | Status: DC | PRN

## 2021-12-07 MED ORDER — SHINGRIX 50 MCG/0.5ML IM SUSR: 0.5000 mL | Freq: Once | INTRAMUSCULAR | 0 refills | Status: DC

## 2021-12-07 MED ORDER — TETANUS-DIPHTH-ACELL PERTUSSIS 5-2-15.5 LF-MCG/0.5 IM SUSP: 0.5000 mL | Freq: Once | INTRAMUSCULAR | 0 refills | Status: AC

## 2021-12-07 NOTE — Assessment & Plan Note (Signed)
He stopped Entresto and metoprolol on his own a few weeks ago. BP is low legs are swollen and he has crackles on his lungs ?We will resume statin therapy and Eliquis ?Add Furosemide and potassium to take prn  ?

## 2021-12-07 NOTE — Assessment & Plan Note (Signed)
Looks pale, we will recheck labs  ?

## 2021-12-07 NOTE — Assessment & Plan Note (Signed)
Off levothyroxine for 3 weeks, resume medication and recheck in 6 weeks  ?

## 2021-12-07 NOTE — Assessment & Plan Note (Signed)
Both arms ?

## 2021-12-07 NOTE — Assessment & Plan Note (Signed)
Rate controlled 

## 2022-01-06 ENCOUNTER — Encounter: Payer: Self-pay | Admitting: Family Medicine

## 2022-01-06 ENCOUNTER — Inpatient Hospital Stay
Admission: EM | Admit: 2022-01-06 | Discharge: 2022-01-12 | DRG: 291 | Disposition: A | Discharge status: Hospice | Payer: Medicare Other | Source: Ambulatory Visit | Attending: Internal Medicine | Admitting: Internal Medicine

## 2022-01-06 ENCOUNTER — Other Ambulatory Visit: Payer: Self-pay

## 2022-01-06 ENCOUNTER — Emergency Department: Payer: Medicare Other

## 2022-01-06 ENCOUNTER — Ambulatory Visit (INDEPENDENT_AMBULATORY_CARE_PROVIDER_SITE_OTHER): Payer: Medicare Other | Admitting: Family Medicine

## 2022-01-06 VITALS — BP 116/66 | HR 64 | Temp 97.4°F | Resp 16 | Ht 67.0 in | Wt 185.0 lb

## 2022-01-06 DIAGNOSIS — I25810 Atherosclerosis of coronary artery bypass graft(s) without angina pectoris: Secondary | ICD-10-CM | POA: Diagnosis not present

## 2022-01-06 DIAGNOSIS — H9113 Presbycusis, bilateral: Secondary | ICD-10-CM | POA: Diagnosis present

## 2022-01-06 DIAGNOSIS — E861 Hypovolemia: Secondary | ICD-10-CM | POA: Diagnosis present

## 2022-01-06 DIAGNOSIS — F03911 Unspecified dementia, unspecified severity, with agitation: Secondary | ICD-10-CM | POA: Diagnosis present

## 2022-01-06 DIAGNOSIS — Z79890 Hormone replacement therapy (postmenopausal): Secondary | ICD-10-CM

## 2022-01-06 DIAGNOSIS — Z7189 Other specified counseling: Secondary | ICD-10-CM | POA: Diagnosis not present

## 2022-01-06 DIAGNOSIS — Z7901 Long term (current) use of anticoagulants: Secondary | ICD-10-CM

## 2022-01-06 DIAGNOSIS — J9601 Acute respiratory failure with hypoxia: Secondary | ICD-10-CM | POA: Diagnosis present

## 2022-01-06 DIAGNOSIS — R296 Repeated falls: Secondary | ICD-10-CM | POA: Diagnosis present

## 2022-01-06 DIAGNOSIS — Z66 Do not resuscitate: Secondary | ICD-10-CM | POA: Diagnosis present

## 2022-01-06 DIAGNOSIS — I10 Essential (primary) hypertension: Secondary | ICD-10-CM | POA: Diagnosis not present

## 2022-01-06 DIAGNOSIS — E871 Hypo-osmolality and hyponatremia: Secondary | ICD-10-CM | POA: Diagnosis present

## 2022-01-06 DIAGNOSIS — R000 Tachycardia, unspecified: Secondary | ICD-10-CM | POA: Diagnosis not present

## 2022-01-06 DIAGNOSIS — I4891 Unspecified atrial fibrillation: Secondary | ICD-10-CM | POA: Diagnosis present

## 2022-01-06 DIAGNOSIS — Z825 Family history of asthma and other chronic lower respiratory diseases: Secondary | ICD-10-CM

## 2022-01-06 DIAGNOSIS — I110 Hypertensive heart disease with heart failure: Secondary | ICD-10-CM | POA: Diagnosis present

## 2022-01-06 DIAGNOSIS — E663 Overweight: Secondary | ICD-10-CM | POA: Diagnosis present

## 2022-01-06 DIAGNOSIS — K7460 Unspecified cirrhosis of liver: Secondary | ICD-10-CM | POA: Diagnosis present

## 2022-01-06 DIAGNOSIS — T502X5A Adverse effect of carbonic-anhydrase inhibitors, benzothiadiazides and other diuretics, initial encounter: Secondary | ICD-10-CM | POA: Diagnosis not present

## 2022-01-06 DIAGNOSIS — I2510 Atherosclerotic heart disease of native coronary artery without angina pectoris: Secondary | ICD-10-CM | POA: Diagnosis present

## 2022-01-06 DIAGNOSIS — N200 Calculus of kidney: Secondary | ICD-10-CM | POA: Diagnosis not present

## 2022-01-06 DIAGNOSIS — W19XXXD Unspecified fall, subsequent encounter: Secondary | ICD-10-CM | POA: Diagnosis not present

## 2022-01-06 DIAGNOSIS — Z91138 Patient's unintentional underdosing of medication regimen for other reason: Secondary | ICD-10-CM

## 2022-01-06 DIAGNOSIS — W19XXXA Unspecified fall, initial encounter: Secondary | ICD-10-CM | POA: Diagnosis present

## 2022-01-06 DIAGNOSIS — R600 Localized edema: Secondary | ICD-10-CM | POA: Diagnosis not present

## 2022-01-06 DIAGNOSIS — F05 Delirium due to known physiological condition: Secondary | ICD-10-CM | POA: Diagnosis not present

## 2022-01-06 DIAGNOSIS — Z87442 Personal history of urinary calculi: Secondary | ICD-10-CM

## 2022-01-06 DIAGNOSIS — E785 Hyperlipidemia, unspecified: Secondary | ICD-10-CM | POA: Diagnosis present

## 2022-01-06 DIAGNOSIS — E039 Hypothyroidism, unspecified: Secondary | ICD-10-CM | POA: Diagnosis present

## 2022-01-06 DIAGNOSIS — E876 Hypokalemia: Secondary | ICD-10-CM | POA: Diagnosis not present

## 2022-01-06 DIAGNOSIS — R404 Transient alteration of awareness: Secondary | ICD-10-CM | POA: Diagnosis not present

## 2022-01-06 DIAGNOSIS — Z1152 Encounter for screening for COVID-19: Secondary | ICD-10-CM

## 2022-01-06 DIAGNOSIS — T501X6A Underdosing of loop [high-ceiling] diuretics, initial encounter: Secondary | ICD-10-CM | POA: Diagnosis present

## 2022-01-06 DIAGNOSIS — I255 Ischemic cardiomyopathy: Secondary | ICD-10-CM | POA: Diagnosis present

## 2022-01-06 DIAGNOSIS — I959 Hypotension, unspecified: Secondary | ICD-10-CM | POA: Diagnosis present

## 2022-01-06 DIAGNOSIS — I509 Heart failure, unspecified: Secondary | ICD-10-CM

## 2022-01-06 DIAGNOSIS — I5021 Acute systolic (congestive) heart failure: Secondary | ICD-10-CM | POA: Diagnosis not present

## 2022-01-06 DIAGNOSIS — J9811 Atelectasis: Secondary | ICD-10-CM | POA: Diagnosis present

## 2022-01-06 DIAGNOSIS — R5381 Other malaise: Secondary | ICD-10-CM | POA: Diagnosis present

## 2022-01-06 DIAGNOSIS — K7290 Hepatic failure, unspecified without coma: Secondary | ICD-10-CM | POA: Diagnosis present

## 2022-01-06 DIAGNOSIS — Z800 Family history of malignant neoplasm of digestive organs: Secondary | ICD-10-CM

## 2022-01-06 DIAGNOSIS — Z955 Presence of coronary angioplasty implant and graft: Secondary | ICD-10-CM

## 2022-01-06 DIAGNOSIS — R609 Edema, unspecified: Secondary | ICD-10-CM

## 2022-01-06 DIAGNOSIS — I482 Chronic atrial fibrillation, unspecified: Secondary | ICD-10-CM | POA: Diagnosis present

## 2022-01-06 DIAGNOSIS — I480 Paroxysmal atrial fibrillation: Secondary | ICD-10-CM | POA: Diagnosis not present

## 2022-01-06 DIAGNOSIS — I5022 Chronic systolic (congestive) heart failure: Secondary | ICD-10-CM | POA: Diagnosis not present

## 2022-01-06 DIAGNOSIS — M7989 Other specified soft tissue disorders: Secondary | ICD-10-CM | POA: Diagnosis not present

## 2022-01-06 DIAGNOSIS — R0609 Other forms of dyspnea: Secondary | ICD-10-CM

## 2022-01-06 DIAGNOSIS — J90 Pleural effusion, not elsewhere classified: Secondary | ICD-10-CM | POA: Diagnosis not present

## 2022-01-06 DIAGNOSIS — Z79899 Other long term (current) drug therapy: Secondary | ICD-10-CM

## 2022-01-06 DIAGNOSIS — I5023 Acute on chronic systolic (congestive) heart failure: Secondary | ICD-10-CM

## 2022-01-06 DIAGNOSIS — Z7401 Bed confinement status: Secondary | ICD-10-CM | POA: Diagnosis not present

## 2022-01-06 DIAGNOSIS — Z96649 Presence of unspecified artificial hip joint: Secondary | ICD-10-CM | POA: Diagnosis present

## 2022-01-06 DIAGNOSIS — Z515 Encounter for palliative care: Secondary | ICD-10-CM | POA: Diagnosis not present

## 2022-01-06 LAB — SARS CORONAVIRUS 2 BY RT PCR: SARS Coronavirus 2 by RT PCR: NEGATIVE

## 2022-01-06 LAB — CBC WITH DIFFERENTIAL/PLATELET
Abs Immature Granulocytes: 0.05 10*3/uL (ref 0.00–0.07)
Basophils Absolute: 0 10*3/uL (ref 0.0–0.1)
Basophils Relative: 0 %
Eosinophils Absolute: 0 10*3/uL (ref 0.0–0.5)
Eosinophils Relative: 0 %
HCT: 38 % — ABNORMAL LOW (ref 39.0–52.0)
Hemoglobin: 11.7 g/dL — ABNORMAL LOW (ref 13.0–17.0)
Immature Granulocytes: 1 %
Lymphocytes Relative: 6 %
Lymphs Abs: 0.5 10*3/uL — ABNORMAL LOW (ref 0.7–4.0)
MCH: 25.8 pg — ABNORMAL LOW (ref 26.0–34.0)
MCHC: 30.8 g/dL (ref 30.0–36.0)
MCV: 83.7 fL (ref 80.0–100.0)
Monocytes Absolute: 0.6 10*3/uL (ref 0.1–1.0)
Monocytes Relative: 7 %
Neutro Abs: 7 10*3/uL (ref 1.7–7.7)
Neutrophils Relative %: 86 %
Platelets: 240 10*3/uL (ref 150–400)
RBC: 4.54 MIL/uL (ref 4.22–5.81)
RDW: 16.7 % — ABNORMAL HIGH (ref 11.5–15.5)
WBC: 8.1 10*3/uL (ref 4.0–10.5)
nRBC: 0.2 % (ref 0.0–0.2)

## 2022-01-06 LAB — OSMOLALITY: Osmolality: 261 mOsm/kg — ABNORMAL LOW (ref 275–295)

## 2022-01-06 LAB — COMPREHENSIVE METABOLIC PANEL
ALT: 24 U/L (ref 0–44)
AST: 65 U/L — ABNORMAL HIGH (ref 15–41)
Albumin: 3.8 g/dL (ref 3.5–5.0)
Alkaline Phosphatase: 85 U/L (ref 38–126)
Anion gap: 10 (ref 5–15)
BUN: 19 mg/dL (ref 8–23)
CO2: 23 mmol/L (ref 22–32)
Calcium: 9.1 mg/dL (ref 8.9–10.3)
Chloride: 87 mmol/L — ABNORMAL LOW (ref 98–111)
Creatinine, Ser: 0.9 mg/dL (ref 0.61–1.24)
GFR, Estimated: >60 mL/min (ref >60)
Glucose, Bld: 110 mg/dL — ABNORMAL HIGH (ref 70–99)
Potassium: 5.3 mmol/L — ABNORMAL HIGH (ref 3.5–5.1)
Sodium: 120 mmol/L — ABNORMAL LOW (ref 135–145)
Total Bilirubin: 1.7 mg/dL — ABNORMAL HIGH (ref 0.3–1.2)
Total Protein: 7 g/dL (ref 6.5–8.1)

## 2022-01-06 LAB — BRAIN NATRIURETIC PEPTIDE: B Natriuretic Peptide: 580.4 pg/mL — ABNORMAL HIGH (ref 0.0–100.0)

## 2022-01-06 LAB — TSH: TSH: 4.063 u[IU]/mL (ref 0.350–4.500)

## 2022-01-06 MED ORDER — FUROSEMIDE 10 MG/ML IJ SOLN
40.0000 mg | Freq: Once | INTRAMUSCULAR | Status: AC
  Administered 2022-01-06: 40 mg via INTRAVENOUS
  Filled 2022-01-06: qty 4

## 2022-01-06 MED ORDER — SODIUM CHLORIDE 0.9% FLUSH
3.0000 mL | Freq: Two times a day (BID) | INTRAVENOUS | Status: DC
  Administered 2022-01-06 – 2022-01-11 (×10): 3 mL via INTRAVENOUS

## 2022-01-06 MED ORDER — ONDANSETRON HCL 4 MG PO TABS: 4.0000 mg | ORAL_TABLET | Freq: Four times a day (QID) | ORAL | Status: DC | PRN

## 2022-01-06 MED ORDER — POTASSIUM CHLORIDE CRYS ER 20 MEQ PO TBCR: 20.0000 meq | EXTENDED_RELEASE_TABLET | Freq: Every day | ORAL | Status: DC

## 2022-01-06 MED ORDER — ACETAMINOPHEN 650 MG RE SUPP: 650.0000 mg | Freq: Four times a day (QID) | RECTAL | Status: DC | PRN

## 2022-01-06 MED ORDER — ENOXAPARIN SODIUM 40 MG/0.4ML IJ SOSY: 40.0000 mg | PREFILLED_SYRINGE | INTRAMUSCULAR | Status: DC

## 2022-01-06 MED ORDER — LEVOTHYROXINE SODIUM 25 MCG PO TABS
25.0000 ug | ORAL_TABLET | Freq: Every day | ORAL | Status: DC
  Administered 2022-01-07 – 2022-01-11 (×5): 25 ug via ORAL
  Filled 2022-01-06 (×6): qty 1

## 2022-01-06 MED ORDER — ACETAMINOPHEN 325 MG PO TABS
650.0000 mg | ORAL_TABLET | Freq: Four times a day (QID) | ORAL | Status: DC | PRN
  Administered 2022-01-08 – 2022-01-10 (×5): 650 mg via ORAL
  Filled 2022-01-06 (×5): qty 2

## 2022-01-06 MED ORDER — ONDANSETRON HCL 4 MG/2ML IJ SOLN: 4.0000 mg | Freq: Four times a day (QID) | INTRAMUSCULAR | Status: DC | PRN

## 2022-01-06 MED ORDER — APIXABAN 5 MG PO TABS
5.0000 mg | ORAL_TABLET | Freq: Two times a day (BID) | ORAL | Status: DC
  Administered 2022-01-06 – 2022-01-11 (×11): 5 mg via ORAL
  Filled 2022-01-06 (×12): qty 1

## 2022-01-06 MED ORDER — FUROSEMIDE 10 MG/ML IJ SOLN
40.0000 mg | Freq: Every day | INTRAMUSCULAR | Status: DC
  Administered 2022-01-07 – 2022-01-08 (×2): 40 mg via INTRAVENOUS
  Filled 2022-01-06 (×2): qty 4

## 2022-01-06 NOTE — ED Notes (Signed)
First encounter with pt  Pt found to be in wheelchair yelling "Help". No call bell in reach and pt on new oxygen requirements.    Pt states he is here due to BLE swelling. Pt does have 4+ pitting edema and it also appears to be weeping at [facility] time. Pt states this has been increasing over the last 1-2 weeks. Socks removed and they were found to be saturated. Pt states HX of CHF and takes 20mg  of lasix daily and was sent here for further eval from PCP. Pt states he has been taking them as RX'ed.   Pt is HOH but is A&Ox4.

## 2022-01-06 NOTE — Assessment & Plan Note (Addendum)
TSH within normal limits.  Continue Synthroid. 

## 2022-01-06 NOTE — ED Notes (Addendum)
Pt placed on 2L [place] and O2 at 97%.

## 2022-01-06 NOTE — Assessment & Plan Note (Addendum)
Continue apixaban as primary prophylaxis for an acute stroke -considering multiple falls this may need to be reconsidered as he may not be a good candidate for Eliquis. Patient not on a beta-blocker due to relative hypotension

## 2022-01-06 NOTE — ED Triage Notes (Addendum)
Pt comes with c/o bilateral leg swelling. Family reports this started few weeks ago. Pt has increased weakness. Pt has had increased SOb.

## 2022-01-06 NOTE — Progress Notes (Addendum)
Pt was admitted on the floor with no signs of distress. Pt only alert to self and place and disoriented to time and situation. Pt brought wallet and medicine to the hospital. Pt wallet was sent to safe for safe keeping and medication was sent to pharmacy. Pt was educated about safety and ascom within pt reach. Unable to complete secreening assessment will notify incoming shift. Will continue to monitor.

## 2022-01-06 NOTE — Assessment & Plan Note (Addendum)
Likely hypovolemic hyponatremia.  Sodium improved from 120->122. Consult nephrology

## 2022-01-06 NOTE — Assessment & Plan Note (Addendum)
Patient has ischemic cardiomyopathy with last known LVEF of 35 to 40% from a 2D echocardiogram which was done in 06/22 2D echocardiogram shows decreased LV function with global hypokinesis. Patient is not on any beta-blocker or ACE inhibitor due to relative hypotension.

## 2022-01-06 NOTE — ED Provider Notes (Signed)
[facility] Provider Note    Event Date/Time   First MD Initiated Contact with Patient 01/06/22 1529     (approximate)   History   Leg Swelling   HPI  Kirk Sanchez is a 88 y.o. male with past medical history of CHF, hypertension, hard of hearing, here with leg swelling and weakness.  History provided primarily by the patient's son.  Per report, over the last several weeks, but particular last several days, patient is gotten increasingly weak and dyspneic.  He said difficulty getting around.  He essentially has been more or less unable to walk on his own.  He has had increasing lower extremity swelling.  He is unsure whether he has been taking his Lasix as prescribed.  He has gained several pounds.  Denies any known fevers.  On my assessment, history somewhat limited due to patient being hard of hearing.  He does, however, deny any pain.  Denies any other complaints at this time other than feeling weak.     Physical Exam   Triage Vital Signs: ED Triage Vitals  Enc Vitals Group     BP 01/06/22 1509 (!) 123/105     Pulse Rate 01/06/22 1509 99     Resp 01/06/22 1509 20     Temp 01/06/22 1509 98 F (36.7 C)     Temp src --      SpO2 01/06/22 1509 (!) 85 %     Weight --      Height --      Head Circumference --      Peak Flow --      Pain Score 01/06/22 1507 0     Pain Loc --      Pain Edu? --      Excl. in [place]? --     Most recent vital signs: Vitals:   01/06/22 1509  BP: (!) 123/105  Pulse: 99  Resp: 20  Temp: 98 F (36.7 C)  SpO2: (!) 85%     General: Awake, no distress.  CV:  Good peripheral perfusion.  Regular rate and rhythm. Resp:  Normal effort.  Bibasilar rales noted Abd:  No distention.  No tenderness Other:  3+ pitting edema bilateral lower extremities with some areas of weeping.   ED Results / Procedures / Treatments   Labs (all labs ordered are listed, but only abnormal results are displayed) Labs Reviewed  BRAIN  NATRIURETIC PEPTIDE - Abnormal; Notable for the following components:      Result Value   B Natriuretic Peptide 580.4 (*)    All other components within normal limits  COMPREHENSIVE METABOLIC PANEL - Abnormal; Notable for the following components:   Sodium 120 (*)    Potassium 5.3 (*)    Chloride 87 (*)    Glucose, Bld 110 (*)    AST 65 (*)    Total Bilirubin 1.7 (*)    All other components within normal limits  CBC WITH DIFFERENTIAL/PLATELET - Abnormal; Notable for the following components:   Hemoglobin 11.7 (*)    HCT 38.0 (*)    MCH 25.8 (*)    RDW 16.7 (*)    Lymphs Abs 0.5 (*)    All other components within normal limits  SARS CORONAVIRUS 2 BY RT PCR     EKG Atrial fibrillation, ventricular rate 97.  QRS 138, QTc 462.  Intraventricular conduction delay.  No acute ST elevations.   RADIOLOGY Chest x-ray: Cardiac enlargement, interstitial edema, bilateral pleural effusions  I also independently reviewed and agree with radiologist interpretations.   PROCEDURES:  Critical Care performed: No  .1-3 Lead EKG Interpretation Performed by: Duffy Bruce, MD Authorized by: Duffy Bruce, MD     Interpretation: normal     ECG rate:  90-100   ECG rate assessment: normal     Rhythm: sinus rhythm     Ectopy: none     Conduction: normal   Comments:     Indication: SOB    MEDICATIONS ORDERED IN ED: Medications  furosemide (LASIX) injection 40 mg (has no administration in time range)     IMPRESSION / MDM / ASSESSMENT AND PLAN / ED COURSE  I reviewed the triage vital signs and the nursing notes.                               The patient is on the cardiac monitor to evaluate for evidence of arrhythmia and/or significant heart rate changes.   Ddx:  Differential includes the following, with pertinent life- or limb-threatening emergencies considered:  CHF exacerbation, renal failure, liver failure, DVT, ACS, PE  Patient's presentation is most consistent with  acute presentation with potential threat to life or bodily function.  MDM:  88 year old male with past medical history of systolic heart failure, hypertension, followed by cardiology clinic, here with generalized weakness and leg swelling.  On exam, patient has significant pitting edema bilateral lower extremities, tachypnea, new onset hypoxia, concerning for pulmonary edema and CHF exacerbation.  Chest x-ray shows significant edema, bilateral pleural effusions with curly B-lines.  CBC shows no leukocytosis or significant change in anemia.  Do not suspect infectious cause.  CMP with acute on chronic hyponatremia which I suspect is due to hypervolemia.  Renal function is normal.  BNP at 580.  We will plan to admit for IV diuresis, physical therapy, and reassessment.  No signs of acute ischemia.  EKG is nonischemic.   MEDICATIONS GIVEN IN ED: Medications  furosemide (LASIX) injection 40 mg (has no administration in time range)     Consults:  Hospitalist   EMR reviewed  Cardiology, Darylene Price notes and last TTE 12/2020     FINAL CLINICAL IMPRESSION(S) / ED DIAGNOSES   Final diagnoses:  Acute on chronic systolic congestive heart failure ([place])  Peripheral edema     Rx / DC Orders   ED Discharge Orders     None        Note:  This document was prepared using Dragon voice recognition software and may include unintentional dictation errors.   Duffy Bruce, MD 01/06/22 1610

## 2022-01-06 NOTE — Plan of Care (Signed)
  Problem: Clinical Measurements: Goal: Respiratory complications will improve Outcome: Progressing   Problem: Clinical Measurements: Goal: Cardiovascular complication will be avoided Outcome: Progressing   Problem: Elimination: Goal: Will not experience complications related to bowel motility Outcome: Progressing   Problem: Elimination: Goal: Will not experience complications related to urinary retention Outcome: Progressing   Problem: Pain Managment: Goal: General experience of comfort will improve Outcome: Progressing   Problem: Safety: Goal: Ability to remain free from injury will improve Outcome: Progressing   

## 2022-01-06 NOTE — Assessment & Plan Note (Addendum)
Patient presents for evaluation of exertional shortness of breath, bilateral lower extremity swelling and orthopnea Imaging shows cardiac enlargement, mild interstitial edema, small bilateral pleural effusions and bibasilar atelectasis. BNP is elevated Continue Lasix 40 mg IV daily Consult cardiology Net IO Since Admission: -1,555 mL [01/07/22 1847]

## 2022-01-06 NOTE — Progress Notes (Signed)
Patient ID: Kirk Sanchez, male    DOB: [birth year], 88 y.o.   MRN: [id number]  PCP: Alba Cory, MD  Chief Complaint  Patient presents with   Extremity Weakness    On both legs x3 weeks    Subjective:   [name] is a 88 y.o. male, presents to clinic with CC of the following:  HPI  Pt is here for sudden decline He is extremely HOH - pt son gives most of history, but he seems unfamiliar with his fathers health, meds etc. He recently moved in with his son but usually is very independent and cares for himself - including all ADLs, and only recently started to need a walker instead of a cane to get around Pt with decline in the past 2 weeks Pt presented here for "weak legs" he has not been able to ambulate at all, SOB, increased LE edema He is not taking lasix Weight increased per scale here - up 7-8 lbs Pt states he is not SOB that's why he didn't take his lasix, but his son says he has been taking the potassium supplement In office we were unable to get a SpO2 on him or temperature  His PCP is out of the office - but CMA who recently saw him with Dr. Carlynn Purl states she didn't recognize him because he looks so much worse.    Wt Readings from Last 5 Encounters:  01/06/22 185 lb (83.9 kg)  12/07/21 177 lb (80.3 kg)  07/16/21 175 lb 12.8 oz (79.7 kg)  06/09/21 178 lb 12.8 oz (81.1 kg)  05/26/21 180 lb 8 oz (81.9 kg)   BMI Readings from Last 5 Encounters:  01/06/22 27.72 kg/m  12/07/21 27.72 kg/m  07/16/21 27.53 kg/m  06/09/21 28.00 kg/m  05/26/21 28.27 kg/m      Patient Active Problem List   Diagnosis Date Noted   Atherosclerosis of aorta (HCC) 12/07/2021   Hyposmolality and/or hyponatremia 01/27/2021   Chronic combined systolic and diastolic congestive heart failure (HCC) 08/09/2019   Ischemic cardiomyopathy 08/09/2019   Senile purpura (HCC) 02/06/2019   DNR (do not resuscitate)    Right shoulder tendonitis 03/08/2017   Atrial fibrillation (HCC)  03/06/2016   Presbycusis of both ears 03/06/2016   Advance care planning 03/03/2016   Anemia 03/03/2016   Arteriosclerosis of coronary artery 01/28/2015   Cardiac murmur 01/28/2015   HLD (hyperlipidemia) 01/28/2015   Hypothyroidism 01/28/2015      Current Outpatient Medications:    furosemide (LASIX) 20 MG tablet, Take 1 tablet (20 mg total) by mouth daily as needed. Increase sob , weight gain or swelling, Disp: 30 tablet, Rfl: 0   levothyroxine (SYNTHROID) 25 MCG tablet, Take 1 tablet (25 mcg total) by mouth daily before breakfast., Disp: 90 tablet, Rfl: 3   potassium chloride SA (KLOR-CON M) 20 MEQ tablet, Take 1 tablet (20 mEq total) by mouth daily as needed. Only when taking furosemide, Disp: 30 tablet, Rfl: 3   apixaban (ELIQUIS) 5 MG TABS tablet, Take 1 tablet (5 mg total) by mouth 2 (two) times daily. (Patient not taking: Reported on 12/07/2021), Disp: 180 tablet, Rfl: 3   SHINGRIX injection, USE AS DIRECTED. (Patient not taking: Reported on 01/06/2022), Disp: 0.5 each, Rfl: 0   simvastatin (ZOCOR) 40 MG tablet, TAKE 1 TABLET BY MOUTH AT BEDTIME (Patient not taking: Reported on 12/07/2021), Disp: 90 tablet, Rfl: 1   No Known Allergies   Social History   Tobacco Use   Smoking  status: Never   Smokeless tobacco: Never  Vaping Use   Vaping Use: Never used  Substance Use Topics   Alcohol use: No    Alcohol/week: 0.0 standard drinks   Drug use: No      Chart Review Today: I personally reviewed active problem list, medication list, allergies, family history, social history, health maintenance, notes from last encounter, lab results, imaging with the patient/caregiver today.   Review of Systems  Unable to perform ROS: Acuity of condition (and so HOH he cannot answer questions)      Objective:   Vitals:   01/06/22 1411  BP: 116/66  Pulse: 64  Resp: 16  Temp: (!) 97.4 F (36.3 C)  TempSrc: Axillary  Weight: 185 lb (83.9 kg)  Height: 5\' 7"  (1.702 m)    Body mass index  is 27.72 kg/m.  Physical Exam Vitals and nursing note reviewed.  Constitutional:      General: He is not in acute distress.    Appearance: He is obese. He is ill-appearing. He is not diaphoretic.     Comments: Well developed elderly male  HENT:     Head: Normocephalic and atraumatic.     Right Ear: External ear normal.     Left Ear: External ear normal.     Mouth/Throat:     Mouth: Mucous membranes are dry.     Pharynx: Oropharynx is clear.  Eyes:     General:        Right eye: No discharge.        Left eye: No discharge.     Conjunctiva/sclera: Conjunctivae normal.  Cardiovascular:     Rate and Rhythm: Normal rate and regular rhythm.     Pulses:          Radial pulses are 1+ on the right side and 1+ on the left side.     Heart sounds: Heart sounds are distant.  Pulmonary:     Effort: No tachypnea or respiratory distress.     Breath sounds: Examination of the left-lower field reveals decreased breath sounds. Decreased breath sounds, rhonchi and rales present. No wheezing.     Comments: Poor inspiratory effort Able to say 1-2 words at a time At rest w/o speaking no tachypnea Musculoskeletal:     Right lower leg: 2+ Edema present.     Left lower leg: 2+ Edema present.  Skin:    General: Skin is cool.     Coloration: Skin is pale. Skin is not cyanotic.     Comments: Nails very pale - nearly white  Neurological:     Mental Status: He is alert.     Results for orders placed or performed in visit on 04/27/21  Basic metabolic panel  Result Value Ref Range   Sodium 134 (L) 135 - 145 mmol/L   Potassium 4.5 3.5 - 5.1 mmol/L   Chloride 95 (L) 98 - 111 mmol/L   CO2 32 22 - 32 mmol/L   Glucose, Bld 111 (H) 70 - 99 mg/dL   BUN 22 8 - 23 mg/dL   Creatinine, Ser 1.610.82 0.61 - 1.24 mg/dL   Calcium 9.0 8.9 - 09.610.3 mg/dL   GFR, Estimated >04>60 >54>60 mL/min   Anion gap 7 5 - 15       Assessment & Plan:     ICD-10-CM   1. Acute on chronic congestive heart failure, unspecified heart  failure type (HCC)  I50.9    pitting edema, weight increase, unable to walk at all,  which is not his baseline, unable to get pulseox here, congested lungs, 1+ radial pulses - sent to ED     2. DOE (dyspnea on exertion)  R06.09    new - cannot ambulate with walker due to SOB and other generalized weakness?      Pt appears pale, ill, unable to give hx and son is poor historian, father cares for himself usually, but lately unable to ambulate, worsening edema, DOE.  Seems to be taking meds wrong - not taking lasix but taking potassium supplement Pulses diminished here, pt pale, lungs - LLB suspect pleural effusion - very diminished BS, dull to percussion to left lower lung field b/l mid lungs with crackles Pt at rest is not having any respiratory distress - but again cannot get SpO2 despite attempting multiple times and after warming up hands  Feels he needs ER for labs/VS imaging and likely IV diuresis   He will probably need home health with a significant change to his baseline - no WC at home - if not admitted we will need to do [facility] order for eval and management  I offered and encouraged EMS to take pt to the ER but pt and his son refused and they preferred to use our [facility] here and get help at ED getting him out of the car  Danelle Berry, PA-C 01/06/22 2:29 PM

## 2022-01-06 NOTE — H&P (Signed)
History and Physical    Patient: Kirk Sanchez [id number] DOB: Jul 09, 1925 DOA: 01/06/2022 DOS: the patient was seen and examined on 01/06/2022 PCP: Steele Sizer, MD  Patient coming from: Home  Chief Complaint:  Chief Complaint  Patient presents with   Leg Swelling   Most of the history was provided by the son at the bedside as patient is hard of hearing HPI: Kirk Sanchez is a 88 y.o. male with medical history significant for paroxysmal A-fib, chronic systolic CHF, hypertension, hypothyroidism, coronary artery disease status post stent angioplasty who presents to the ER for evaluation of bilateral lower extremity swelling, exertional shortness of breath and orthopnea for 3 days. Patient's son states that patient used to be ambulatory with a cane and over the last 1 month started using a rolling walker within the last couple of days has been very weak and requires a 1 person assist to get around the house.  He has also had frequent falls. He denies having any chest pain, no nausea, no vomiting, no dizziness, no lightheadedness, no headache, no fever, no chills, no cough, no urinary symptoms or changes in his bowel habits. In the ER he was noted to be hypoxic with room air pulse oximetry of 85% and is currently on 4 L of oxygen to maintain pulse oximetry greater than 92% Review of Systems: As mentioned in the history of present illness. All other systems reviewed and are negative. Past Medical History:  Diagnosis Date   Atrial fibrillation (HCC)    CHF (congestive heart failure) ([place])    Hyperlipidemia    Hypertension    Kidney stone    Presbycusis of both ears 03/06/2016   Thyroid disease    Past Surgical History:  Procedure Laterality Date   HAND ARTHROPLASTY     HIP ARTHROPLASTY     STENT PLACEMENT RT URETER ([facility])     Social History:  reports that he has never smoked. He has never used smokeless tobacco. He reports that he does not drink alcohol and does not use  drugs.  No Known Allergies  Family History  Problem Relation Age of Onset   Cancer Mother        stomach   Asthma Father    Stroke Neg Hx    Heart disease Neg Hx     Prior to Admission medications   Medication Sig Start Date End Date Taking? Authorizing Provider  apixaban (ELIQUIS) 5 MG TABS tablet Take 1 tablet (5 mg total) by mouth 2 (two) times daily. 04/27/21  Yes Darylene Price A, FNP  furosemide (LASIX) 20 MG tablet Take 1 tablet (20 mg total) by mouth daily as needed. Increase sob , weight gain or swelling 12/07/21  Yes Sowles, Drue Stager, MD  levothyroxine (SYNTHROID) 25 MCG tablet Take 1 tablet (25 mcg total) by mouth daily before breakfast. 01/22/21  Yes Kathrine Haddock, NP  potassium chloride SA (KLOR-CON M) 20 MEQ tablet Take 1 tablet (20 mEq total) by mouth daily as needed. Only when taking furosemide 12/07/21  Yes Sowles, Drue Stager, MD  [facility] injection USE AS DIRECTED. Patient not taking: Reported on 01/06/2022 12/08/21   Steele Sizer, MD  simvastatin (ZOCOR) 40 MG tablet TAKE 1 TABLET BY MOUTH AT BEDTIME Patient not taking: Reported on 12/07/2021 01/22/21   Kathrine Haddock, NP    Physical Exam: Vitals:   01/06/22 1509  BP: (!) 123/105  Pulse: 99  Resp: 20  Temp: 98 F (36.7 C)  SpO2: (!) 85%   Physical  Exam Vitals and nursing note reviewed.  Constitutional:      Appearance: Normal appearance.     Comments: Frail  HENT:     Head: Normocephalic and atraumatic.     Nose: Nose normal.     Mouth/Throat:     Mouth: Mucous membranes are moist.  Eyes:     Pupils: Pupils are equal, round, and reactive to light.  Cardiovascular:     Rate and Rhythm: Tachycardia present.  Pulmonary:     Effort: Pulmonary effort is normal.     Breath sounds: Rales present.  Abdominal:     General: Abdomen is flat. Bowel sounds are normal.     Palpations: Abdomen is soft.  Musculoskeletal:     Cervical back: Normal range of motion and neck supple.     Right lower leg: Edema present.      Left lower leg: Edema present.  Skin:    General: Skin is warm and dry.  Neurological:     Mental Status: He is alert and oriented to person, place, and time.     Motor: Weakness present.  Psychiatric:        Mood and Affect: Mood normal.        Behavior: Behavior normal.    Data Reviewed: Relevant notes from primary care and specialist visits, past discharge summaries as available in EHR, including Care Everywhere. Prior diagnostic testing as pertinent to current admission diagnoses Updated medications and problem lists for reconciliation ED course, including vitals, labs, imaging, treatment and response to treatment Triage notes, nursing and pharmacy notes and ED provider's notes Notable results as noted in HPI Labs reviewed.  BNP 580, sodium 120, potassium 5.3, chloride 87, bicarb 23, glucose 110, BUN 19, creatinine 0.90, calcium 9.1, total protein 7.0, albumin 3.8, AST 65, ALT 24, alkaline phosphatase 85, white count 8.1, hemoglobin 11.7, hematocrit 38.0, MCV 83.7, platelet count 240 Chest x-ray reviewed by me shows Cardiac enlargement, mild interstitial edema, small bilateral pleural effusions and bibasilar atelectasis. Twelve-lead EKG reviewed by me shows a flutter with variable A-V block There are no new results to review at this time.  Assessment and Plan: Acute on chronic systolic CHF (congestive heart failure) ([place]) Patient presents for evaluation of exertional shortness of breath, bilateral lower extremity swelling and orthopnea Imaging shows cardiac enlargement, mild interstitial edema, small bilateral pleural effusions and bibasilar atelectasis. BNP is elevated Place patient on Lasix 40 mg IV daily Consult cardiology  Acute respiratory failure with hypoxia ([place]) Secondary to acute systolic CHF Patient had room air pulse oximetry of 85% and is currently on 4 L of oxygen to maintain pulse oximetry greater than 92% We will attempt to wean patient off oxygen once acute  illness improves or resolves  Fall Patient has had multiple falls at home and appears to be very weak and deconditioned by his son. He currently requires a 1 person assist to get around and initially used a rolling walker Will need PT evaluation once acute illness improves Place patient on fall precautions  Hyponatremia with excess extracellular fluid volume Noted to have a serum sodium of 120 and this appears to be related to volume overload Place patient on a 1.5 L fluid restriction Continue Lasix 40 mg IV daily We will obtain urine osmolality, serum osmolality and urine sodium levels Consult nephrology    Ischemic cardiomyopathy Patient has ischemic cardiomyopathy with last known LVEF of 35 to 40% from a 2D echocardiogram which was done in 06/22 2D echocardiogram shows decreased  LV function with global hypokinesis. Patient is not on any beta-blocker or ACE inhibitor due to relative hypotension  Atrial fibrillation (HCC) Continue apixaban as primary prophylaxis for an acute stroke Patient not on a beta-blocker due to relative hypotension  Hypothyroidism Stable Continue Synthroid      Advance Care Planning:   Code Status: DNR   Consults: Cardiology, nephrology  Family Communication: Greater than 50% of time was spent discussing patient's condition and plan of care with his son at the bedside.  All questions and concerns have been addressed.  They verbalized understanding and agree with the plan.  CODE STATUS was discussed and patient is a DNR  Severity of Illness: The appropriate patient status for this patient is INPATIENT. Inpatient status is judged to be reasonable and necessary in order to provide the required intensity of service to ensure the patient's safety. The patient's presenting symptoms, physical exam findings, and initial radiographic and laboratory data in the context of their chronic comorbidities is felt to place them at high risk for further clinical  deterioration. Furthermore, it is not anticipated that the patient will be medically stable for discharge from the hospital within 2 midnights of admission.   * I certify that at the point of admission it is my clinical judgment that the patient will require inpatient hospital care spanning beyond 2 midnights from the point of admission due to high intensity of service, high risk for further deterioration and high frequency of surveillance required.*  Author: Collier Bullock, MD 01/06/2022 5:13 PM  For on call review [email or web address].

## 2022-01-06 NOTE — Assessment & Plan Note (Addendum)
Secondary to acute systolic CHF Patient had room air pulse oximetry of 85% and was initially on 4 L of oxygen to maintain pulse oximetry greater than 92% Weaned off to 2 L oxygen

## 2022-01-06 NOTE — ED Notes (Signed)
Pt educated that he has a purewick on. Pt found to be urinating on floor, pt educated on the need to sit down due to safety issues.

## 2022-01-06 NOTE — Assessment & Plan Note (Addendum)
Patient has had multiple falls at home and appears to be very weak and deconditioned by his son. He currently requires a 1 person assist to get around and initially used a rolling walker PT and OT recommends home health and likely 24/7 supervision He also had unwitnessed fall this evening.  No injury per nursing

## 2022-01-06 NOTE — ED Provider Triage Note (Signed)
  Emergency Medicine Provider Triage Evaluation Note  Kirk Sanchez , a 88 y.o.male,  was evaluated in triage.  Pt complains of leg swelling and shortness of breath.  He is joined by his family member, who states that this been going on for the past few weeks.   Review of Systems  Positive: Leg swelling, shortness of breath Negative: Denies fever, chest pain, vomiting  Physical Exam  There were no vitals filed for this visit. Gen:   Awake, no distress   Resp:  Normal effort  MSK:   Moves extremities without difficulty  Other:  3+ pitting edema in the lower extremities bilaterally.  Medical Decision Making  Given the patient's initial medical screening exam, the following diagnostic evaluation has been ordered. The patient will be placed in the appropriate treatment space, once one is available, to complete the evaluation and treatment. I have discussed the plan of care with the patient and I have advised the patient that an ED physician or mid-level practitioner will reevaluate their condition after the test results have been received, as the results may give them additional insight into the type of treatment they may need.    Diagnostics: Labs, CXR, EKG  Treatments: Nasal cannula   Varney Daily, [place] 01/06/22 [phone]

## 2022-01-06 NOTE — ED Notes (Signed)
Dinner tray given to pt, chicken cut up for pt

## 2022-01-07 ENCOUNTER — Inpatient Hospital Stay: Payer: Medicare Other

## 2022-01-07 DIAGNOSIS — I480 Paroxysmal atrial fibrillation: Secondary | ICD-10-CM

## 2022-01-07 DIAGNOSIS — I5023 Acute on chronic systolic (congestive) heart failure: Secondary | ICD-10-CM | POA: Diagnosis not present

## 2022-01-07 DIAGNOSIS — J9601 Acute respiratory failure with hypoxia: Secondary | ICD-10-CM

## 2022-01-07 DIAGNOSIS — W19XXXD Unspecified fall, subsequent encounter: Secondary | ICD-10-CM

## 2022-01-07 DIAGNOSIS — Z7189 Other specified counseling: Secondary | ICD-10-CM | POA: Diagnosis not present

## 2022-01-07 LAB — BASIC METABOLIC PANEL
Anion gap: 10 (ref 5–15)
BUN: 19 mg/dL (ref 8–23)
CO2: 24 mmol/L (ref 22–32)
Calcium: 8.5 mg/dL — ABNORMAL LOW (ref 8.9–10.3)
Chloride: 88 mmol/L — ABNORMAL LOW (ref 98–111)
Creatinine, Ser: 0.8 mg/dL (ref 0.61–1.24)
GFR, Estimated: >60 mL/min (ref >60)
Glucose, Bld: 88 mg/dL (ref 70–99)
Potassium: 4.4 mmol/L (ref 3.5–5.1)
Sodium: 122 mmol/L — ABNORMAL LOW (ref 135–145)

## 2022-01-07 LAB — CBC
HCT: 34.9 % — ABNORMAL LOW (ref 39.0–52.0)
Hemoglobin: 11 g/dL — ABNORMAL LOW (ref 13.0–17.0)
MCH: 25.8 pg — ABNORMAL LOW (ref 26.0–34.0)
MCHC: 31.5 g/dL (ref 30.0–36.0)
MCV: 81.7 fL (ref 80.0–100.0)
Platelets: 202 10*3/uL (ref 150–400)
RBC: 4.27 MIL/uL (ref 4.22–5.81)
RDW: 16.6 % — ABNORMAL HIGH (ref 11.5–15.5)
WBC: 7.6 10*3/uL (ref 4.0–10.5)
nRBC: 0 % (ref 0.0–0.2)

## 2022-01-07 NOTE — Care Management Important Message (Signed)
Important Message  Patient Details  Name: Kirk Sanchez MRN: [id number] Date of Birth: 1925/05/27   Medicare Important Message Given:  N/A - LOS <3 / Initial given by admissions     [name] 01/07/2022, 1:38 PM

## 2022-01-07 NOTE — Hospital Course (Addendum)
88 y.o. male with past medical history significant for paroxysmal A-fib, chronic systolic CHF, hypertension, hypothyroidism, coronary artery disease status post stent angioplasty admitted on 6/7 for CHF exacerbation and acute respiratory failure.  Cardiology following and patient has continued to receive Lasix via infusion, diuresing over 6 L.  At times, limited due to hypotension.  6/12: Goals of care conversation with son who is agreeable for hospice at home

## 2022-01-07 NOTE — Progress Notes (Signed)
Progress Note   Patient: Kirk Sanchez [id number]:[id number] DOB: [birth year] DOA: 01/06/2022     1 DOS: the patient was seen and examined on 01/07/2022   Brief hospital course: 88 y.o. male with medical history significant for paroxysmal A-fib, chronic systolic CHF, hypertension, hypothyroidism, coronary artery disease status post stent angioplasty admitted for CHF exacerbation  6/8: Palliative care consult.  Ongoing diuresis, PT OT eval   Assessment and Plan: Acute on chronic systolic CHF (congestive heart failure) (HCC) Patient presents for evaluation of exertional shortness of breath, bilateral lower extremity swelling and orthopnea Imaging shows cardiac enlargement, mild interstitial edema, small bilateral pleural effusions and bibasilar atelectasis. BNP is elevated Continue Lasix 40 mg IV daily Consult cardiology Net IO Since Admission: -1,555 mL [01/07/22 1847]  Acute respiratory failure with hypoxia (HCC) Secondary to acute systolic CHF Patient had room air pulse oximetry of 85% and was initially on 4 L of oxygen to maintain pulse oximetry greater than 92% Weaned off to 2 L oxygen  Fall Patient has had multiple falls at home and appears to be very weak and deconditioned by his son. He currently requires a 1 person assist to get around and initially used a rolling walker PT and OT recommends home health and likely 24/7 supervision He also had unwitnessed fall this evening.  No injury per nursing  Hyponatremia with excess extracellular fluid volume Likely hypovolemic hyponatremia.  Sodium improved from 120->122. Consult nephrology    Ischemic cardiomyopathy Patient has ischemic cardiomyopathy with last known LVEF of 35 to 40% from a 2D echocardiogram which was done in 06/22 2D echocardiogram shows decreased LV function with global hypokinesis. Patient is not on any beta-blocker or ACE inhibitor due to relative hypotension.  Atrial fibrillation (HCC) Continue apixaban as  primary prophylaxis for an acute stroke -considering multiple falls this may need to be reconsidered as he may not be a good candidate for Eliquis. Patient not on a beta-blocker due to relative hypotension  Hypothyroidism TSH within normal limits Continue Synthroid        Subjective: sob improving, hard of hearing  Physical Exam: Vitals:   01/07/22 0758 01/07/22 1040 01/07/22 1819 01/07/22 1849  BP: 121/83 105/74 115/81 105/76  Pulse: 80 92 95 80  Resp: 14 17 18 15   Temp: (!) 97.5 F (36.4 C) 97.7 F (36.5 C) 97.6 F (36.4 C) (!) 97.5 F (36.4 C)  TempSrc: Axillary Axillary    SpO2: 100% 98% 90% 93%  Weight:      Height:       Constitutional:      Appearance: Normal appearance.     Comments: Frail  HENT:     Head: Normocephalic and atraumatic.     Nose: Nose normal.     Mouth/Throat:     Mouth: Mucous membranes are moist.  Eyes:     Pupils: Pupils are equal, round, and reactive to light.  Cardiovascular:     Rate and Rhythm: Regular Pulmonary:     Effort: Pulmonary effort is normal.     Breath sounds: Decreased breath sounds.  Abdominal:     General: Abdomen is flat. Bowel sounds are normal.     Palpations: Abdomen is soft.  Musculoskeletal:     Cervical back: Normal range of motion and neck supple.     Right lower leg: Edema present.     Left lower leg: Edema present.  Skin:    General: Skin is warm and dry.  Neurological:     Mental Status:  He is alert and oriented to person, place, and time.     Motor: Weakness present.  Psychiatric:        Mood and Affect: Mood normal.        Behavior: Behavior normal.   Data Reviewed:  Na 122, BNP 580.4  Family Communication: None  Disposition: Status is: Inpatient Remains inpatient appropriate because: ongoing Diuresis and GOC   Planned Discharge Destination: Home with Home Health   DVT Prophylaxis - Eliquis Time spent: 35 minutes  Author: Delfino Lovett, MD 01/07/2022 9:09 PM  For on call review  [email or web address].

## 2022-01-07 NOTE — Evaluation (Addendum)
Occupational Therapy Evaluation Patient Details Name: Kirk Sanchez MRN: [id number] DOB: 1925/05/17 Today's Date: 01/07/2022   History of Present Illness Kirk Sanchez is a 88 y.o. male with medical history significant for paroxysmal A-fib, chronic systolic CHF, hypertension, hypothyroidism, coronary artery disease status post stent angioplasty who presents to the ER for evaluation of bilateral lower extremity swelling, exertional shortness of breath and orthopnea for 3 days.  Patient's son states that patient used to be ambulatory with a cane and over the last 1 month started using a rolling walker within the last couple of days has been very weak and requires a 1 person assist to get around the house.  He has also had frequent falls.   Clinical Impression   Kirk Sanchez was seen for OT evaluation this date, upon arrival pt walking back to bed with PT and lunch tray arrived. Prior to hospital admission, pt was MOD I per chart using RW with recent falls (pt denies falls). Pt lives with son available PRN however pt reports 24/7 assistance available. Pt presents to acute OT demonstrating impaired ADL performance and functional mobility 2/2 decreased activity tolerance and functional strength/ROM/balance deficits. Pt is HOH, questions asked via pen/paper.   Pt currently requires MIN A + RW sit<>stand with poor eccentric control. Single UE support functional reaching inside BOS. Pt deferred further mobility 2/2 lunch tray arriving - SETUP meals HOB at 90*. Pt would benefit from skilled OT to address noted impairments and functional limitations (see below for any additional details). Upon hospital discharge, recommend HHOT with 24/7 SUPERVISION, if unable to have 24/7 pt would require STR.    Recommendations for follow up therapy are one component of a multi-disciplinary discharge planning process, led by the attending physician.  Recommendations may be updated based on patient status, additional functional  criteria and insurance authorization.   Follow Up Recommendations  Home health OT (will require STR if unable to have 24/7 assistance)    Assistance Recommended at Discharge Frequent or constant Supervision/Assistance  Patient can return home with the following A lot of help with walking and/or transfers;A lot of help with bathing/dressing/bathroom;Assistance with cooking/housework;Help with stairs or ramp for entrance    Functional Status Assessment  Patient has had a recent decline in their functional status and demonstrates the ability to make significant improvements in function in a reasonable and predictable amount of time.  Equipment Recommendations  BSC/3in1    Recommendations for Other Services       Precautions / Restrictions Precautions Precautions: Fall Restrictions Weight Bearing Restrictions: No      Mobility Bed Mobility Overal bed mobility: Needs Assistance Bed Mobility: Sit to Supine       Sit to supine: Mod assist        Transfers Overall transfer level: Needs assistance Equipment used: Rolling walker (2 wheels) Transfers: Sit to/from Stand Sit to Stand: Min assist           General transfer comment: poor eccentric control      Balance Overall balance assessment: Needs assistance, History of Falls Sitting-balance support: Feet supported Sitting balance-Leahy Scale: Fair     Standing balance support: Single extremity supported, During functional activity Standing balance-Leahy Scale: Fair                             ADL either performed or assessed with clinical judgement   ADL Overall ADL's : Needs assistance/impaired  General ADL Comments: MIN A + RW for ADL t/f. SETUP meals HOB at 90*. Single UE support functional reaching inside BOS      Pertinent Vitals/Pain Pain Assessment Pain Assessment: No/denies pain     Hand Dominance     Extremity/Trunk Assessment  Upper Extremity Assessment Upper Extremity Assessment: Generalized weakness   Lower Extremity Assessment Lower Extremity Assessment: Generalized weakness   Cervical / Trunk Assessment Cervical / Trunk Assessment: Normal   Communication Communication Communication: HOH   Cognition Arousal/Alertness: Awake/alert Behavior During Therapy: Impulsive Overall Cognitive Status: Difficult to assess                                                  Home Living Family/patient expects to be discharged to:: Private residence Living Arrangements: Children Available Help at Discharge: Family;Available PRN/intermittently Type of Home: House Home Access: Stairs to enter Entrance Stairs-Number of Steps: 2 Entrance Stairs-Rails: Right                 Home Equipment: Agricultural consultant (2 wheels)   Additional Comments: VERY HOH, have to write down questions for him to answer appropriately.      Prior Functioning/Environment Prior Level of Function : Needs assist;History of Falls (last six months)             Mobility Comments: using RW per chart most recently with falls (pt denies falls hx) ADLs Comments: no assist for ADLs per pt        OT Problem List: Decreased strength;Decreased range of motion;Decreased activity tolerance;Impaired balance (sitting and/or standing);Decreased safety awareness;Decreased cognition      OT Treatment/Interventions: Self-care/ADL training;Therapeutic exercise;Energy conservation;DME and/or AE instruction;Therapeutic activities;Patient/family education;Balance training    OT Goals(Current goals can be found in the care plan section) Acute Rehab OT Goals Patient Stated Goal: to go home OT Goal Formulation: With patient Time For Goal Achievement: 01/21/22 Potential to Achieve Goals: Good  OT Frequency: Min 2X/week       AM-PAC OT "6 Clicks" Daily Activity     Outcome Measure Help from another person eating meals?: None Help  from another person taking care of personal grooming?: A Little Help from another person toileting, which includes using toliet, bedpan, or urinal?: A Lot Help from another person bathing (including washing, rinsing, drying)?: A Lot Help from another person to put on and taking off regular upper body clothing?: None Help from another person to put on and taking off regular lower body clothing?: A Lot 6 Click Score: 17   End of Session Equipment Utilized During Treatment: Rolling walker (2 wheels)  Activity Tolerance: Patient tolerated treatment well Patient left: in bed;with call bell/phone within reach;with bed alarm set  OT Visit Diagnosis: Other abnormalities of gait and mobility (R26.89);Muscle weakness (generalized) (M62.81)                Time: 4196-2229 OT Time Calculation (min): 13 min Charges:  OT General Charges $OT Visit: 1 Visit OT Evaluation $OT Eval Moderate Complexity: 1 Mod  Kathie Dike, M.S. OTR/L  01/07/22, 3:16 PM  ascom [phone]

## 2022-01-07 NOTE — Consult Note (Cosign Needed)
Consultation Note Date: 01/07/2022   Patient Name: Kirk Sanchez  DOB: August 05, 1924  MRN: [id number]  Age / Sex: 88 y.o., male  PCP: Steele Sizer, MD Referring Physician: Max Sane, MD  Reason for Consultation: Establishing goals of care  HPI/Patient Profile: Kirk Sanchez is a 88 y.o. male with medical history significant for paroxysmal A-fib, chronic systolic CHF, hypertension, hypothyroidism, coronary artery disease status post stent angioplasty who presents to the ER for evaluation of bilateral lower extremity swelling, exertional shortness of breath and orthopnea for 3 days.  Clinical Assessment and Goals of Care: Notes and labs reviewed. Patient has been followed by [facility] palliative   to see patient. His son is at bedside. Patient has significant hearing loss and is not able to hear me speak to him. Son tells me patient is widowed. He states he has 2 brothers.   He tells me since last admission, he was able to live alone briefly before living with son and his wife. He states he had been using a cane, but progressed to a walker. Son tells me he was able to walk through house without difficulty. He states recently he became less active. He states his legs began to swell. Son tells me this happens about once per year. He is eager to see what his father's needs are and is concerned about possible need for wheelchair. Discussed SNF.  SUMMARY OF RECOMMENDATIONS   PMT will continue to follow.         Primary Diagnoses: Present on Admission:  Acute on chronic systolic CHF (congestive heart failure) (HCC)  Ischemic cardiomyopathy  Hypothyroidism  Hyponatremia with excess extracellular fluid volume  Fall  Atrial fibrillation ([place])  Acute respiratory failure with hypoxia ([place])   I have reviewed the medical record, interviewed the patient and family, and examined the patient. The following  aspects are pertinent.  Past Medical History:  Diagnosis Date   Atrial fibrillation (HCC)    CHF (congestive heart failure) ([place])    Hyperlipidemia    Hypertension    Kidney stone    Presbycusis of both ears 03/06/2016   Thyroid disease    Social History   Socioeconomic History   Marital status: Widowed    Spouse name: Not on file   Number of children: 3   Years of education: Not on file   Highest education level: High school graduate  Occupational History   Occupation: Retired  Tobacco Use   Smoking status: Never   Smokeless tobacco: Never  Vaping Use   Vaping Use: Never used  Substance and Sexual Activity   Alcohol use: No    Alcohol/week: 0.0 standard drinks of alcohol   Drug use: No   Sexual activity: Not Currently  Other Topics Concern   Not on file  Social History Narrative   Pt lives alone but currently staying with a friend or his son Chrissie Noa since last hospital admission   Social Determinants of Health   Financial Resource Strain: Low Risk  (07/16/2021)   Overall Financial  Resource Strain (CARDIA)    Difficulty of Paying Living Expenses: Not hard at all  Food Insecurity: No Food Insecurity (07/16/2021)   Hunger Vital Sign    Worried About Running Out of Food in the Last Year: Never true    Ran Out of Food in the Last Year: Never true  Transportation Needs: No Transportation Needs (07/16/2021)   PRAPARE - Hydrologist (Medical): No    Lack of Transportation (Non-Medical): No  Physical Activity: Inactive (07/16/2021)   Exercise Vital Sign    Days of Exercise per Week: 0 days    Minutes of Exercise per Session: 0 min  Stress: No Stress Concern Present (07/16/2021)   [place]    Feeling of Stress : Not at all  Social Connections: Moderately Isolated (07/16/2021)   Social Connection and Isolation Panel [NHANES]    Frequency of Communication with Friends and  Family: More than three times a week    Frequency of Social Gatherings with Friends and Family: More than three times a week    Attends Religious Services: More than 4 times per year    Active Member of [employer] or Organizations: No    Attends Archivist Meetings: Never    Marital Status: Widowed   Family History  Problem Relation Age of Onset   Cancer Mother        stomach   Asthma Father    Stroke Neg Hx    Heart disease Neg Hx    Scheduled Meds:  apixaban  5 mg Oral BID   furosemide  40 mg Intravenous Daily   levothyroxine  25 mcg Oral QAC breakfast   sodium chloride flush  3 mL Intravenous Q12H   Continuous Infusions: PRN Meds:.acetaminophen **OR** acetaminophen, ondansetron **OR** ondansetron (ZOFRAN) IV Medications Prior to Admission:  Prior to Admission medications   Medication Sig Start Date End Date Taking? Authorizing Provider  apixaban (ELIQUIS) 5 MG TABS tablet Take 1 tablet (5 mg total) by mouth 2 (two) times daily. 04/27/21  Yes Darylene Price A, FNP  furosemide (LASIX) 20 MG tablet Take 1 tablet (20 mg total) by mouth daily as needed. Increase sob , weight gain or swelling 12/07/21  Yes Sowles, Drue Stager, MD  levothyroxine (SYNTHROID) 25 MCG tablet Take 1 tablet (25 mcg total) by mouth daily before breakfast. 01/22/21  Yes Kathrine Haddock, NP  metoprolol succinate (TOPROL-XL) 25 MG 24 hr tablet Take 25 mg by mouth daily.   Yes [provider]  potassium chloride SA (KLOR-CON M) 20 MEQ tablet Take 1 tablet (20 mEq total) by mouth daily as needed. Only when taking furosemide 12/07/21  Yes Sowles, Drue Stager, MD  [facility] injection USE AS DIRECTED. Patient not taking: Reported on 01/06/2022 12/08/21   Steele Sizer, MD  simvastatin (ZOCOR) 40 MG tablet TAKE 1 TABLET BY MOUTH AT BEDTIME Patient not taking: Reported on 12/07/2021 01/22/21   Kathrine Haddock, NP   No Known Allergies Review of Systems  HENT:  Positive for hearing loss.     Physical Exam Pulmonary:      Effort: Pulmonary effort is normal.  Neurological:     Mental Status: He is alert.     Vital Signs: BP 105/74 (BP Location: Right Arm)   Pulse 92   Temp 97.7 F (36.5 C) (Axillary)   Resp 17   Ht 6\' 4"  (1.93 m)   Wt 81.1 kg   SpO2 98%   BMI  21.76 kg/m  Pain Scale: 0-10   Pain Score: 0-No pain   SpO2: SpO2: 98 % O2 Device:SpO2: 98 % O2 Flow Rate: .O2 Flow Rate (L/min): 2 L/min  IO: Intake/output summary:  Intake/Output Summary (Last 24 hours) at 01/07/2022 1545 Last data filed at 01/07/2022 1140 Gross per 24 hour  Intake 320 ml  Output 1875 ml  Net -1555 ml    Asencion Gowda, NP   Please contact Palliative Medicine Team phone at [phone] for questions and concerns.  For individual provider: See Shea Evans

## 2022-01-07 NOTE — Evaluation (Signed)
Physical Therapy Evaluation Patient Details Name: Kirk Sanchez MRN: [id number] DOB: 25-Jul-1925 Today's Date: 01/07/2022  History of Present Illness  Kirk Sanchez is a 88 y.o. male with medical history significant for paroxysmal A-fib, chronic systolic CHF, hypertension, hypothyroidism, coronary artery disease status post stent angioplasty who presents to the ER for evaluation of bilateral lower extremity swelling, exertional shortness of breath and orthopnea for 3 days.  Patient's son states that patient used to be ambulatory with a cane and over the last 1 month started using a rolling walker within the last couple of days has been very weak and requires a 1 person assist to get around the house.  He has also had frequent falls.  Clinical Impression  Patient received trying to get up out of bed independently in unsafe environment. Patient is very HOH and requires cues and assistance for safety. He required mod A to stand from low surface. Min guard for ambulation with RW. Patient has decreased safety awareness and will benefit from 24 hour supervision/assist at home. No family present in room at time of evaluation. He will continue to benefit from skilled PT to maximize independence and safety.          Recommendations for follow up therapy are one component of a multi-disciplinary discharge planning process, led by the attending physician.  Recommendations may be updated based on patient status, additional functional criteria and insurance authorization.  Follow Up Recommendations Home health PT    Assistance Recommended at Discharge Frequent or constant Supervision/Assistance  Patient can return home with the following  A little help with walking and/or transfers;A little help with bathing/dressing/bathroom;Assistance with cooking/housework;Assist for transportation;Help with stairs or ramp for entrance;Direct supervision/assist for medications management    Equipment Recommendations None  recommended by PT  Recommendations for Other Services       Functional Status Assessment Patient has had a recent decline in their functional status and demonstrates the ability to make significant improvements in function in a reasonable and predictable amount of time.     Precautions / Restrictions Precautions Precautions: Fall Restrictions Weight Bearing Restrictions: No      Mobility  Bed Mobility Overal bed mobility: Needs Assistance Bed Mobility: Supine to Sit     Supine to sit: Min assist, HOB elevated     General bed mobility comments: patient half out of the bed upon arrival, trying to go to bathroom    Transfers Overall transfer level: Needs assistance Equipment used: Rolling walker (2 wheels) Transfers: Sit to/from Stand Sit to Stand: Min assist, From elevated surface, Mod assist           General transfer comment: mod A from low toilet    Ambulation/Gait Ambulation/Gait assistance: Min guard Gait Distance (Feet): 25 Feet Assistive device: Rolling walker (2 wheels) Gait Pattern/deviations: Step-to pattern, Shuffle, Decreased step length - right, Decreased step length - left Gait velocity: decr     General Gait Details: patient ambulated into bathroom with min guard and RW. Then back to bed.  Stairs            Wheelchair Mobility    Modified Rankin (Stroke Patients Only)       Balance Overall balance assessment: Needs assistance, History of Falls Sitting-balance support: Feet supported Sitting balance-Leahy Scale: Fair     Standing balance support: Bilateral upper extremity supported, During functional activity, Reliant on assistive device for balance Standing balance-Leahy Scale: Poor  Pertinent Vitals/Pain Pain Assessment Pain Assessment: No/denies pain    Home Living Family/patient expects to be discharged to:: Private residence Living Arrangements: Children Available Help at  Discharge: Family;Available PRN/intermittently Type of Home: House Home Access: Stairs to enter Entrance Stairs-Rails: Right Entrance Stairs-Number of Steps: 2       Additional Comments: VERY HOH, have to write down questions for him to answer appropriately.    Prior Function Prior Level of Function : Needs assist;History of Falls (last six months)             Mobility Comments: using RW per chart most recently with falls       Hand Dominance        Extremity/Trunk Assessment   Upper Extremity Assessment Upper Extremity Assessment: Defer to OT evaluation    Lower Extremity Assessment Lower Extremity Assessment: Generalized weakness    Cervical / Trunk Assessment Cervical / Trunk Assessment: Normal  Communication   Communication: HOH  Cognition Arousal/Alertness: Awake/alert Behavior During Therapy: Impulsive Overall Cognitive Status: Difficult to assess                                          General Comments      Exercises     Assessment/Plan    PT Assessment Patient needs continued PT services  PT Problem List Decreased mobility;Decreased strength;Decreased activity tolerance;Decreased balance;Decreased safety awareness       PT Treatment Interventions Therapeutic exercise;DME instruction;Gait training;Functional mobility training;Therapeutic activities;Patient/family education;Balance training    PT Goals (Current goals can be found in the Care Plan section)  Acute Rehab PT Goals Patient Stated Goal: none stated PT Goal Formulation: Patient unable to participate in goal setting Time For Goal Achievement: 01/21/22    Frequency Min 2X/week     Co-evaluation               AM-PAC PT "6 Clicks" Mobility  Outcome Measure Help needed turning from your back to your side while in a flat bed without using bedrails?: A Little Help needed moving from lying on your back to sitting on the side of a flat bed without using  bedrails?: A Little Help needed moving to and from a bed to a chair (including a wheelchair)?: A Little Help needed standing up from a chair using your arms (e.g., wheelchair or bedside chair)?: A Lot Help needed to walk in hospital room?: A Little Help needed climbing 3-5 steps with a railing? : A Lot 6 Click Score: 16    End of Session Equipment Utilized During Treatment: Gait belt Activity Tolerance: Patient tolerated treatment well Patient left: in bed;Other (comment) (OT in room) Nurse Communication: Mobility status;Precautions PT Visit Diagnosis: Unsteadiness on feet (R26.81);Other abnormalities of gait and mobility (R26.89);Muscle weakness (generalized) (M62.81);Difficulty in walking, not elsewhere classified (R26.2);History of falling (Z91.81)    Time: 2202-5427 PT Time Calculation (min) (ACUTE ONLY): 12 min   Charges:   PT Evaluation $PT Eval Moderate Complexity: 1 Mod          Latish Toutant, PT, GCS 01/07/22,1:43 PM

## 2022-01-07 NOTE — Progress Notes (Signed)
   01/07/22 1819  What Happened  Was fall witnessed? No  Was patient injured? No  Patient found on floor  Found by Staff-comment  Stated prior activity ambulating-unassisted  Follow Up  MD notified Dr Sherryll Burger  Time MD notified [phone]  Family notified Yes - comment  Time family notified 1945  Additional tests No  Progress note created (see row info) Yes  Adult Fall Risk Assessment  Risk Factor Category (scoring not indicated) History of more than one fall within 6 months before admission (document High fall risk);High fall risk per protocol (document High fall risk)  Patient Fall Risk Level High fall risk  Adult Fall Risk Interventions  Required Bundle Interventions *See Row Information* High fall risk - low, moderate, and high requirements implemented  Additional Interventions Use of appropriate toileting equipment (bedpan, BSC, etc.);Family Supervision;PT/OT need assessed if change in mobility from baseline  Screening for Fall Injury Risk (To be completed on HIGH fall risk patients) - Assessing Need for Floor Mats  Risk For Fall Injury- Criteria for Floor Mats 85 years or older  Will Implement Floor Mats Yes  Vitals  Temp 97.6 F (36.4 C)  BP 115/81  MAP (mmHg) 93  BP Location Left Arm  BP Method Automatic  Patient Position (if appropriate) Sitting  Pulse Rate 95  Pulse Rate Source Monitor  Resp 18  Oxygen Therapy  SpO2 90 %  O2 Device Nasal Cannula  O2 Flow Rate (L/min) 2 L/min  Patient Activity (if Appropriate) In chair  Pulse Oximetry Type Intermittent  Pain Assessment  Pain Scale 0-10  Pain Score 0  PCA/Epidural/Spinal Assessment  Respiratory Pattern Regular  Neurological  Neuro (WDL) X  Level of Consciousness Alert  Orientation Level Oriented to person;Oriented to place;Disoriented to time;Disoriented to situation  Administrator commands;Memory impairment  Speech Clear  R Pupil Size (mm) 3  R Pupil Shape Round  R Pupil Reaction Brisk  L Pupil Size (mm) 3  L  Pupil Shape Round  L Pupil Reaction Brisk  Neuro Symptoms Forgetful  Musculoskeletal  Assistive Device Front wheel walker  Generalized Weakness Yes  Weight Bearing Restrictions No  Integumentary  Skin Color Appropriate for ethnicity  Skin Condition Dry  Skin Integrity Ecchymosis;Abrasion;Erythema/redness  Abrasion Location Leg  Abrasion Location Orientation Bilateral  Ecchymosis Location Arm  Ecchymosis Location Orientation Bilateral  Erythema/Redness Location Leg  Erythema/Redness Location Orientation Bilateral  Skin Turgor Non-tenting

## 2022-01-08 ENCOUNTER — Inpatient Hospital Stay
Admit: 2022-01-08 | Discharge: 2022-01-08 | Disposition: A | Discharge status: Home / Self Care | Payer: Medicare Other | Attending: Internal Medicine | Admitting: Internal Medicine

## 2022-01-08 DIAGNOSIS — I480 Paroxysmal atrial fibrillation: Secondary | ICD-10-CM | POA: Diagnosis not present

## 2022-01-08 DIAGNOSIS — J9601 Acute respiratory failure with hypoxia: Secondary | ICD-10-CM | POA: Diagnosis not present

## 2022-01-08 DIAGNOSIS — I5023 Acute on chronic systolic (congestive) heart failure: Secondary | ICD-10-CM | POA: Diagnosis not present

## 2022-01-08 DIAGNOSIS — Z7189 Other specified counseling: Secondary | ICD-10-CM | POA: Diagnosis not present

## 2022-01-08 LAB — ECHOCARDIOGRAM COMPLETE
AR max vel: 2.24 cm2
AV Area VTI: 2.36 cm2
AV Area mean vel: 2.07 cm2
AV Mean grad: 1 mmHg
AV Peak grad: 1.6 mmHg
Ao pk vel: 0.63 m/s
Area-P 1/2: 3.4 cm2
Height: 76 in
S' Lateral: 3.9 cm
Weight: 2070.56 oz

## 2022-01-08 LAB — BASIC METABOLIC PANEL
Anion gap: 11 (ref 5–15)
BUN: 20 mg/dL (ref 8–23)
CO2: 26 mmol/L (ref 22–32)
Calcium: 8.6 mg/dL — ABNORMAL LOW (ref 8.9–10.3)
Chloride: 86 mmol/L — ABNORMAL LOW (ref 98–111)
Creatinine, Ser: 0.92 mg/dL (ref 0.61–1.24)
GFR, Estimated: >60 mL/min (ref >60)
Glucose, Bld: 100 mg/dL — ABNORMAL HIGH (ref 70–99)
Potassium: 3.9 mmol/L (ref 3.5–5.1)
Sodium: 123 mmol/L — ABNORMAL LOW (ref 135–145)

## 2022-01-08 LAB — CBC
HCT: 37.8 % — ABNORMAL LOW (ref 39.0–52.0)
Hemoglobin: 11.8 g/dL — ABNORMAL LOW (ref 13.0–17.0)
MCH: 25.4 pg — ABNORMAL LOW (ref 26.0–34.0)
MCHC: 31.2 g/dL (ref 30.0–36.0)
MCV: 81.3 fL (ref 80.0–100.0)
Platelets: 223 10*3/uL (ref 150–400)
RBC: 4.65 MIL/uL (ref 4.22–5.81)
RDW: 16.8 % — ABNORMAL HIGH (ref 11.5–15.5)
WBC: 10.7 10*3/uL — ABNORMAL HIGH (ref 4.0–10.5)
nRBC: 0 % (ref 0.0–0.2)

## 2022-01-08 LAB — OSMOLALITY: Osmolality: 261 mOsm/kg — ABNORMAL LOW (ref 275–295)

## 2022-01-08 LAB — URIC ACID: Uric Acid, Serum: 6.3 mg/dL (ref 3.7–8.6)

## 2022-01-08 LAB — CORTISOL: Cortisol, Plasma: 31.4 ug/dL

## 2022-01-08 MED ORDER — HALOPERIDOL LACTATE 5 MG/ML IJ SOLN
2.0000 mg | Freq: Once | INTRAMUSCULAR | Status: AC
Start: 2022-01-08 — End: 2022-01-08
  Administered 2022-01-08: 2 mg via INTRAVENOUS

## 2022-01-08 MED ORDER — FUROSEMIDE 10 MG/ML IJ SOLN
40.0000 mg | Freq: Once | INTRAMUSCULAR | Status: AC
Start: 2022-01-08 — End: 2022-01-08
  Administered 2022-01-08: 40 mg via INTRAVENOUS
  Filled 2022-01-08: qty 4

## 2022-01-08 MED ORDER — METOPROLOL SUCCINATE ER 25 MG PO TB24
25.0000 mg | ORAL_TABLET | Freq: Every day | ORAL | Status: DC
  Administered 2022-01-08 – 2022-01-09 (×2): 25 mg via ORAL
  Filled 2022-01-08 (×3): qty 1

## 2022-01-08 MED ORDER — HALOPERIDOL LACTATE 5 MG/ML IJ SOLN
1.0000 mg | Freq: Four times a day (QID) | INTRAMUSCULAR | Status: DC | PRN
  Administered 2022-01-09: 1 mg via INTRAVENOUS
  Filled 2022-01-08: qty 1

## 2022-01-08 MED ORDER — HALOPERIDOL LACTATE 5 MG/ML IJ SOLN
2.5000 mg | Freq: Once | INTRAMUSCULAR | Status: AC
  Administered 2022-01-08: 2.5 mg via INTRAVENOUS

## 2022-01-08 MED ORDER — FUROSEMIDE 10 MG/ML IJ SOLN
6.0000 mg/h | INTRAVENOUS | Status: DC
  Administered 2022-01-08 – 2022-01-11 (×3): 6 mg/h via INTRAVENOUS
  Filled 2022-01-08 (×3): qty 20

## 2022-01-08 MED ORDER — HALOPERIDOL LACTATE 5 MG/ML IJ SOLN
INTRAMUSCULAR | Status: AC
  Filled 2022-01-08: qty 1

## 2022-01-08 NOTE — Assessment & Plan Note (Addendum)
Continue apixaban as primary prophylaxis for an acute stroke -considering multiple falls this may need to be reconsidered as he may not be a good candidate for Eliquis.  Heart rate controlled with metoprolol.  

## 2022-01-08 NOTE — NC FL2 (Signed)
House LEVEL OF CARE SCREENING TOOL     IDENTIFICATION  Patient Name: Kirk Sanchez Birthdate: 02/01/1925 Sex: male Admission Date (Current Location): 01/06/2022  [facility] and [place] Number:  Engineering geologist and Address:  [facility], [address], [place], [place] [address]      Provider Number: [id number]  Attending Physician Name and Address:  Max Sane, MD  Relative Name and Phone Number:  Devrick Forestier [phone]    Current Level of Care: Hospital Recommended Level of Care: [place] Prior Approval Number:    Date Approved/Denied:   PASRR Number: [id number]  Discharge Plan: SNF    Current Diagnoses: Patient Active Problem List   Diagnosis Date Noted   Acute on chronic systolic CHF (congestive heart failure) ([place]) 01/06/2022   Fall 01/06/2022   Acute respiratory failure with hypoxia ([place]) 01/06/2022   Atherosclerosis of aorta ([employer]) 12/07/2021   Hyponatremia with excess extracellular fluid volume 01/27/2021   Chronic combined systolic and diastolic congestive heart failure ([place]) 08/09/2019   Ischemic cardiomyopathy 08/09/2019   Senile purpura ([place]) 02/06/2019   DNR (do not resuscitate)    Right shoulder tendonitis 03/08/2017   Atrial fibrillation ([place]) 03/06/2016   Presbycusis of both ears 03/06/2016   Advance care planning 03/03/2016   Anemia 03/03/2016   Arteriosclerosis of coronary artery 01/28/2015   Cardiac murmur 01/28/2015   HLD (hyperlipidemia) 01/28/2015   Hypothyroidism 01/28/2015    Orientation RESPIRATION BLADDER Height & Weight     Self, Time  O2 (023L nasal cannula) Continent Weight: 58.7 kg Height:  6\' 4"  (193 cm)  BEHAVIORAL SYMPTOMS/MOOD NEUROLOGICAL BOWEL NUTRITION STATUS      Continent Diet (2 gram sodium)  AMBULATORY STATUS COMMUNICATION OF NEEDS Skin   Limited Assist Verbally Other (Comment) (erythema, eccchymosis bilateral leg and arms)                        Personal Care Assistance Level of Assistance  Bathing, Feeding, Dressing, Total care Bathing Assistance: Limited assistance Feeding assistance: Limited assistance Dressing Assistance: Limited assistance Total Care Assistance: Limited assistance   Functional Limitations Info  Hearing   Hearing Info: Impaired      SPECIAL CARE FACTORS FREQUENCY  OT (By licensed OT)       OT Frequency: minimum 2x weekly            Contractures Contractures Info: Not present    Additional Factors Info  Code Status, Allergies Code Status Info: DNR Allergies Info: No Known Allergies           Current Medications (01/08/2022):  This is the current hospital active medication list Current Facility-Administered Medications  Medication Dose Route Frequency Provider Last Rate Last Admin   acetaminophen (TYLENOL) tablet 650 mg  650 mg Oral Q6H PRN Agbata, Tochukwu, MD   650 mg at 01/08/22 0134   Or   acetaminophen (TYLENOL) suppository 650 mg  650 mg Rectal Q6H PRN Agbata, Tochukwu, MD       apixaban (ELIQUIS) tablet 5 mg  5 mg Oral BID Agbata, Tochukwu, MD   5 mg at 01/08/22 0918   furosemide (LASIX) 200 mg in dextrose 5 % 100 mL (2 mg/mL) infusion  6 mg/hr Intravenous Continuous Colon Flattery, NP 3 mL/hr at 01/08/22 1501 6 mg/hr at 01/08/22 1501   haloperidol lactate (HALDOL) 5 MG/ML injection            haloperidol lactate (HALDOL) 5 MG/ML injection  haloperidol lactate (HALDOL) injection 1 mg  1 mg Intravenous Q6H PRN Max Sane, MD       levothyroxine (SYNTHROID) tablet 25 mcg  25 mcg Oral QAC breakfast Agbata, Tochukwu, MD   25 mcg at 01/08/22 0918   metoprolol succinate (TOPROL-XL) 24 hr tablet 25 mg  25 mg Oral Daily Manuella Ghazi, Vipul, MD   25 mg at 01/08/22 1235   ondansetron (ZOFRAN) tablet 4 mg  4 mg Oral Q6H PRN Agbata, Tochukwu, MD       Or   ondansetron (ZOFRAN) injection 4 mg  4 mg Intravenous Q6H PRN Agbata, Tochukwu, MD       sodium chloride flush (NS) 0.9 %  injection 3 mL  3 mL Intravenous Q12H Agbata, Tochukwu, MD   3 mL at 01/08/22 1236     Discharge Medications: Please see discharge summary for a list of discharge medications.  Relevant Imaging Results:  Relevant Lab Results:   Additional Information SS#[id number]  Laurena Slimmer, RN

## 2022-01-08 NOTE — Progress Notes (Addendum)
Pt is screaming, hard to re-direct and pulling lines. NP Jon Billings made aware. Will continue to monitor.  Update 0530: NP Morrsion placed order. Will continue to monitor.  Update 0553: NP Jon Billings ordered Haldol 2.5 mg IV once and sitter case. Will continue to monitor.

## 2022-01-08 NOTE — Progress Notes (Signed)
Cross Cover Patient with acute hyperactive delirium. He has had a couple of episodes during the night. Most recently her is combative and unable to redirect. Multiple attempts to get out of bed  Qtc normal range. 2 mg haldol ordered.  Patient may benefit from low dose trazodone at night for sleep

## 2022-01-08 NOTE — Assessment & Plan Note (Signed)
Secondary to acute systolic CHF Patient had room air pulse oximetry of 85% and was initially on 4 L of oxygen to maintain pulse oximetry greater than 92% Weaned off to 3 L oxygen. 

## 2022-01-08 NOTE — Progress Notes (Signed)
*  PRELIMINARY RESULTS* Echocardiogram 2D Echocardiogram has been performed.  Sherrie Sport 01/08/2022, 1:55 PM

## 2022-01-08 NOTE — Plan of Care (Signed)
  Problem: Clinical Measurements: Goal: Will remain free from infection Outcome: Progressing   Problem: Clinical Measurements: Goal: Respiratory complications will improve Outcome: Progressing   Problem: Clinical Measurements: Goal: Cardiovascular complication will be avoided Outcome: Progressing   Problem: Activity: Goal: Risk for activity intolerance will decrease Outcome: Progressing   Problem: Nutrition: Goal: Adequate nutrition will be maintained Outcome: Progressing   Problem: Pain Managment: Goal: General experience of comfort will improve Outcome: Progressing   Problem: Safety: Goal: Ability to remain free from injury will improve Outcome: Progressing   

## 2022-01-08 NOTE — Plan of Care (Signed)
Notes reviewed. Patient is weaning from O2 and is being diuresed. Working towards D/C. Long term prognosis is poor. Patient is currently a DNR.   Recommend outpatient palliative to follow.

## 2022-01-08 NOTE — Assessment & Plan Note (Signed)
Patient has ischemic cardiomyopathy with last known LVEF of 35 to 40% from a 2D echocardiogram which was done in 06/22 2D echocardiogram shows decreased LV function with global hypokinesis. Started on Toprol 25 mg p.o. daily.  Repeat echo and starting Lasix drip per nephrology

## 2022-01-08 NOTE — Progress Notes (Signed)
[facility]  ROUNDING NOTE   Subjective:   Kirk Sanchez is a 88 year old male with past medical conditions  including CAD, A-fib, systolic chronic heart failure, hypertension, and hypothyroidism.  Patient presents to the emergency department with complaints of shortness of breath and lower extremity edema.  Patient has been admitted for Peripheral edema [R60.9] Acute on chronic systolic congestive heart failure (HCC) [I50.23] Acute on chronic systolic CHF (congestive heart failure) (HCC) [I50.23]  Patient is known to our practice and is followed outpatient by Dr. Cherylann RatelLateef.  Patient is currently seen sitting up in chair, safety sitter at bedside.  Patient is extremely confused and unable to follow conversation or answer simple questions.  No family at bedside.  Chart review was used to obtain history.  According to chart review, patient son states patient ambulates at home with cane and started using a roller walker about a month ago due to increased weakness.  Son reports frequent falls.  Nephrology outpatient records indicate CT scan revealing cirrhotic liver.  Labs on ED arrival significant for sodium 120, potassium 5.3.  Chest x-ray shows cardiac enlargement with mild interstitial edema.  Bilateral pleural effusions and bibasilar atelectasis.  Lower extremity Dopplers negative for DVT.  Patient was placed on Lasix and fluid restriction in ED.  We have been consulted to assist in management of hyponatremia.  Objective:  Vital signs in last 24 hours:  Temp:  [97.5 F (36.4 C)-98.8 F (37.1 C)] 98.7 F (37.1 C) (06/09 0527) Pulse Rate:  [80-110] 80 (06/09 1220) Resp:  [15-20] 20 (06/09 1220) BP: (102-115)/(63-88) 113/63 (06/09 1220) SpO2:  [90 %-95 %] 95 % (06/09 1220) Weight:  [58.7 kg] 58.7 kg (06/09 0300)  Weight change: -23.3 kg Filed Weights   01/06/22 2037 01/07/22 0200 01/08/22 0300  Weight: 82 kg 81.1 kg 58.7 kg    Intake/Output: I/O last 3 completed  shifts: In: 680 [P.O.:680] Out: 1875 [Urine:1875]   Intake/Output this shift:  Total I/O In: 60 [P.O.:60] Out: -   Physical Exam: General: NAD, confused  Head: Normocephalic, atraumatic. Moist oral mucosal membranes  Eyes: Anicteric  Lungs:  Clear to auscultation, normal effort  Heart: Regular rate and rhythm  Abdomen:  Soft, nontender  Extremities:  3+ peripheral edema.  Neurologic: Alert, oriented to self only, moving all four extremities  Skin: No lesions  Access: None    Basic Metabolic Panel: Recent Labs  Lab 01/06/22 1518 01/07/22 0706 01/08/22 0746  NA 120* 122* 123*  K 5.3* 4.4 3.9  CL 87* 88* 86*  CO2 23 24 26   GLUCOSE 110* 88 100*  BUN 19 19 20   CREATININE 0.90 0.80 0.92  CALCIUM 9.1 8.5* 8.6*    Liver Function Tests: Recent Labs  Lab 01/06/22 1518  AST 65*  ALT 24  ALKPHOS 85  BILITOT 1.7*  PROT 7.0  ALBUMIN 3.8   No results for input(s): "LIPASE", "AMYLASE" in the last 168 hours. No results for input(s): "AMMONIA" in the last 168 hours.  CBC: Recent Labs  Lab 01/06/22 1518 01/07/22 0706 01/08/22 0746  WBC 8.1 7.6 10.7*  NEUTROABS 7.0  --   --   HGB 11.7* 11.0* 11.8*  HCT 38.0* 34.9* 37.8*  MCV 83.7 81.7 81.3  PLT 240 202 223    Cardiac Enzymes: No results for input(s): "CKTOTAL", "CKMB", "CKMBINDEX", "TROPONINI" in the last 168 hours.  BNP: Invalid input(s): "POCBNP"  CBG: No results for input(s): "GLUCAP" in the last 168 hours.  Microbiology: Results for orders  placed or performed during the hospital encounter of 01/06/22  SARS Coronavirus 2 by RT PCR (hospital order, performed in [facility] hospital lab) *cepheid single result test* Anterior Nasal Swab     Status: None   Collection Time: 01/06/22  4:20 PM   Specimen: Anterior Nasal Swab  Result Value Ref Range Status   SARS Coronavirus 2 by RT PCR NEGATIVE NEGATIVE Final    Comment: (NOTE) SARS-CoV-2 target nucleic acids are NOT DETECTED.  The SARS-CoV-2 RNA is  generally detectable in upper and lower respiratory specimens during the acute phase of infection. The lowest concentration of SARS-CoV-2 viral copies this assay can detect is 250 copies / mL. A negative result does not preclude SARS-CoV-2 infection and should not be used as the sole basis for treatment or other patient management decisions.  A negative result may occur with improper specimen collection / handling, submission of specimen other than nasopharyngeal swab, presence of viral mutation(s) within the areas targeted by this assay, and inadequate number of viral copies (<250 copies / mL). A negative result must be combined with clinical observations, patient history, and epidemiological information.  Fact Sheet for Patients:   RoadLapTop.co.za  Fact Sheet for Healthcare Providers: http://kim-miller.com/  This test is not yet approved or  cleared by the [place] FDA and has been authorized for detection and/or diagnosis of SARS-CoV-2 by FDA under an Emergency Use Authorization (EUA).  This EUA will remain in effect (meaning this test can be used) for the duration of the COVID-19 declaration under Section 564(b)(1) of the Act, 21 U.S.C. section 360bbb-3(b)(1), unless the authorization is terminated or revoked sooner.  Performed at [facility], [address] [place], [place] [address]     Coagulation Studies: No results for input(s): "LABPROT", "INR" in the last 72 hours.  Urinalysis: No results for input(s): "COLORURINE", "LABSPEC", "PHURINE", "GLUCOSEU", "HGBUR", "BILIRUBINUR", "KETONESUR", "PROTEINUR", "UROBILINOGEN", "NITRITE", "LEUKOCYTESUR" in the last 72 hours.  Invalid input(s): "APPERANCEUR"    Imaging: US Venous Img Lower Bilateral (DVT)  Result Date: 01/07/2022 CLINICAL DATA:  Lower extremity swelling x3 days. EXAM: BILATERAL LOWER EXTREMITY VENOUS DOPPLER ULTRASOUND TECHNIQUE: Gray-scale sonography  with graded compression, as well as color Doppler and duplex ultrasound were performed to evaluate the lower extremity deep venous systems from the level of the common femoral vein and including the common femoral, femoral, profunda femoral, popliteal and calf veins including the posterior tibial, peroneal and gastrocnemius veins when visible. The superficial great saphenous vein was also interrogated. Spectral Doppler was utilized to evaluate flow at rest and with distal augmentation maneuvers in the common femoral, femoral and popliteal veins. COMPARISON:  Lower extremity venous duplex, 01/10/2021 - POSITIVE for isolated DVT within the LEFT peroneal vein. FINDINGS: Suboptimal evaluation, with poor acoustic penetration secondary to patient habitus. RIGHT LOWER EXTREMITY VENOUS Normal compressibility of the RIGHT common femoral, superficial femoral, and popliteal veins, as well as the visualized portions of the calf veins. Visualized portions of profunda femoral vein and great saphenous vein unremarkable. No filling defects to suggest DVT on grayscale or color Doppler imaging. Doppler waveforms show normal direction of venous flow, normal respiratory plasticity and response to augmentation. OTHER Eccentric, echogenic wall foci at the proximal RIGHT femoral vein. This appears to have been present on recent comparison (01/2021) study. No evidence of superficial thrombophlebitis or abnormal fluid collection. Limitations: Patient body habitus. Unable to visualize the calf veins. LEFT LOWER EXTREMITY VENOUS Normal compressibility of the LEFT common femoral, superficial femoral, and popliteal veins, as  well as the visualized portions of the calf veins. Visualized portions of profunda femoral vein and great saphenous vein unremarkable. No filling defects to suggest DVT on grayscale or color Doppler imaging. Doppler waveforms show normal direction of venous flow, normal respiratory plasticity and response to augmentation.  OTHER No evidence of superficial thrombophlebitis or abnormal fluid collection. Limitations: Patient body habitus. Unable to visualize the calf veins. IMPRESSION: Suboptimal evaluation, within these constraints; 1. No evidence of femoropopliteal DVT within either lower extremity. 2. Limited visualization of the bilateral lower extremity calf veins. Electronically Signed   By: Roanna Banning M.D.   On: 01/07/2022 09:50   DG Chest 2 View  Result Date: 01/06/2022 CLINICAL DATA:  Pleural effusions and bilateral leg swelling. EXAM: CHEST - 2 VIEW COMPARISON:  Chest CT 01/14/2021 FINDINGS: The heart is enlarged but stable. Moderate tortuosity and calcification of the thoracic aorta. Mild chronic underlying lung changes. There are small bilateral pleural effusions and peripheral Kerley B lines suggesting mild interstitial edema. Streaky bibasilar atelectasis without definite infiltrates. The bony thorax is intact. IMPRESSION: Cardiac enlargement, mild interstitial edema, small bilateral pleural effusions and bibasilar atelectasis. Electronically Signed   By: Rudie Meyer M.D.   On: 01/06/2022 15:42     Medications:    furosemide (LASIX) 200 mg in dextrose 5 % 100 mL (2 mg/mL) infusion      apixaban  5 mg Oral BID   haloperidol lactate       haloperidol lactate       levothyroxine  25 mcg Oral QAC breakfast   metoprolol succinate  25 mg Oral Daily   sodium chloride flush  3 mL Intravenous Q12H   acetaminophen **OR** acetaminophen, haloperidol lactate, haloperidol lactate, haloperidol lactate, ondansetron **OR** ondansetron (ZOFRAN) IV  Assessment/ Plan:  Mr. ADEKUNLE ROHRBACH is a 88 y.o.  male with past medical conditions  including CAD, A-fib on apixaban, systolic chronic heart failure, hypertension, and hypothyroidism.  Patient presents to the emergency department with complaints of shortness of breath and lower extremity edema.  Patient has been admitted for Peripheral edema [R60.9] Acute on chronic  systolic congestive heart failure (HCC) [I50.23] Acute on chronic systolic CHF (congestive heart failure) (HCC) [I50.23]   Hyponatremia likely due to liver cirrhosis seen on previous CT imaging and fluid overload. Patient followed by Dr Cherylann Ratel outpatient for management of this with fluid restriction. Diuresis held. Patient placed on Furosemide 40mg  daily IV with fluid restriction. Admission sodium 120. Currently 123. Given Furosemide 80mg  IV this am. Will initiate Furosemide drip at 6mg /hr to manage fluid status and improve sodium. Due to liver failure, patient not a candidate for Tolvaptan. Will monitor urine output and daily weights.  2.  Chronic systolic heart failure.  Requesting updated echo.  Echo completed on 01/11/2021 shows EF 35 to 40% with global hypokinesis and severe left ventricular hypertrophy.      LOS: 2   6/9/202312:45 PM

## 2022-01-08 NOTE — Assessment & Plan Note (Signed)
Patient presents for evaluation of exertional shortness of breath, bilateral lower extremity swelling and orthopnea Imaging shows cardiac enlargement, mild interstitial edema, small bilateral pleural effusions and bibasilar atelectasis. BNP is elevated Nephro started Lasix drip Consult cardiology Net IO Since Admission: -1,135 mL [01/08/22 1238]

## 2022-01-08 NOTE — Assessment & Plan Note (Signed)
TSH within normal limits.  Continue Synthroid. 

## 2022-01-08 NOTE — Consult Note (Signed)
Kirk Sanchez is a 88 y.o. male  [id number]  Primary Cardiologist: Lujean Amel, MD Reason for Consultation: CHF  HPI: 88 y.o. male with medical history significant for paroxysmal A-fib, chronic systolic CHF, hypertension, hypothyroidism, coronary artery disease status post stent angioplasty admitted for CHF exacerbation.   Review of Systems: Denies pain, shortness of breath. Lower extremity edema bilaterally. Complains of generalized weakness.    Past Medical History:  Diagnosis Date   Atrial fibrillation (HCC)    CHF (congestive heart failure) ([place])    Hyperlipidemia    Hypertension    Kidney stone    Presbycusis of both ears 03/06/2016   Thyroid disease     Medications Prior to Admission  Medication Sig Dispense Refill   apixaban (ELIQUIS) 5 MG TABS tablet Take 1 tablet (5 mg total) by mouth 2 (two) times daily. 180 tablet 3   furosemide (LASIX) 20 MG tablet Take 1 tablet (20 mg total) by mouth daily as needed. Increase sob , weight gain or swelling 30 tablet 0   levothyroxine (SYNTHROID) 25 MCG tablet Take 1 tablet (25 mcg total) by mouth daily before breakfast. 90 tablet 3   metoprolol succinate (TOPROL-XL) 25 MG 24 hr tablet Take 25 mg by mouth daily.     potassium chloride SA (KLOR-CON M) 20 MEQ tablet Take 1 tablet (20 mEq total) by mouth daily as needed. Only when taking furosemide 30 tablet 3   SHINGRIX injection USE AS DIRECTED. (Patient not taking: Reported on 01/06/2022) 0.5 each 0   simvastatin (ZOCOR) 40 MG tablet TAKE 1 TABLET BY MOUTH AT BEDTIME (Patient not taking: Reported on 12/07/2021) 90 tablet 1      apixaban  5 mg Oral BID   haloperidol lactate       haloperidol lactate       levothyroxine  25 mcg Oral QAC breakfast   metoprolol succinate  25 mg Oral Daily   sodium chloride flush  3 mL Intravenous Q12H    Infusions:  furosemide (LASIX) 200 mg in dextrose 5 % 100 mL (2 mg/mL) infusion      No Known Allergies  Social History   Socioeconomic  History   Marital status: Widowed    Spouse name: Not on file   Number of children: 3   Years of education: Not on file   Highest education level: High school graduate  Occupational History   Occupation: Retired  Tobacco Use   Smoking status: Never   Smokeless tobacco: Never  Vaping Use   Vaping Use: Never used  Substance and Sexual Activity   Alcohol use: No    Alcohol/week: 0.0 standard drinks of alcohol   Drug use: No   Sexual activity: Not Currently  Other Topics Concern   Not on file  Social History Narrative   Pt lives alone but currently staying with a friend or his son Chrissie Noa since last hospital admission   Social Determinants of Health   Financial Resource Strain: Low Risk  (07/16/2021)   Overall Financial Resource Strain (CARDIA)    Difficulty of Paying Living Expenses: Not hard at all  Food Insecurity: No Food Insecurity (07/16/2021)   Hunger Vital Sign    Worried About Running Out of Food in the Last Year: Never true    [place] in the Last Year: Never true  Transportation Needs: No Transportation Needs (07/16/2021)   PRAPARE - Hydrologist (Medical): No    Lack of Transportation (  Non-Medical): No  Physical Activity: Inactive (07/16/2021)   Exercise Vital Sign    Days of Exercise per Week: 0 days    Minutes of Exercise per Session: 0 min  Stress: No Stress Concern Present (07/16/2021)   [place]    Feeling of Stress : Not at all  Social Connections: Moderately Isolated (07/16/2021)   Social Connection and Isolation Panel [NHANES]    Frequency of Communication with Friends and Family: More than three times a week    Frequency of Social Gatherings with Friends and Family: More than three times a week    Attends Religious Services: More than 4 times per year    Active Member of [employer] or Organizations: No    Attends Archivist Meetings: Never     Marital Status: Widowed  Intimate Partner Violence: Not At Risk (07/16/2021)   Humiliation, Afraid, Rape, and Kick questionnaire    Fear of Current or Ex-Partner: No    Emotionally Abused: No    Physically Abused: No    Sexually Abused: No    Family History  Problem Relation Age of Onset   Cancer Mother        stomach   Asthma Father    Stroke Neg Hx    Heart disease Neg Hx     PHYSICAL EXAM: Vitals:   01/08/22 0830 01/08/22 1220  BP:  113/63  Pulse:  80  Resp:  20  Temp:    SpO2: 93% 95%     Intake/Output Summary (Last 24 hours) at 01/08/2022 1408 Last data filed at 01/08/2022 1000 Gross per 24 hour  Intake 420 ml  Output --  Net 420 ml    General:  Well appearing. Frail. HEENT: normal Neck: supple. no JVD. Carotids 2+ bilat; no bruits. No lymphadenopathy or thryomegaly appreciated. Cor: Irregular rate & rhythm. No rubs, gallops or murmurs. Lungs: Decreased breath sounds. Abdomen: soft, nontender, nondistended. No hepatosplenomegaly. No bruits or masses. Good bowel sounds. Extremities: Bilateral edema Neuro: alert & oriented x 3, Affect pleasant.  ECG: atrial fibrillation with RVR, HR 114 bpm, non-specific ST changes  Results for orders placed or performed during the hospital encounter of 01/06/22 (from the past 24 hour(s))  CBC     Status: Abnormal   Collection Time: 01/08/22  7:46 AM  Result Value Ref Range   WBC 10.7 (H) 4.0 - 10.5 K/uL   RBC 4.65 4.22 - 5.81 MIL/uL   Hemoglobin 11.8 (L) 13.0 - 17.0 g/dL   HCT 37.8 (L) 39.0 - 52.0 %   MCV 81.3 80.0 - 100.0 fL   MCH 25.4 (L) 26.0 - 34.0 pg   MCHC 31.2 30.0 - 36.0 g/dL   RDW 16.8 (H) 11.5 - 15.5 %   Platelets 223 150 - 400 K/uL   nRBC 0.0 0.0 - 0.2 %  Basic metabolic panel     Status: Abnormal   Collection Time: 01/08/22  7:46 AM  Result Value Ref Range   Sodium 123 (L) 135 - 145 mmol/L   Potassium 3.9 3.5 - 5.1 mmol/L   Chloride 86 (L) 98 - 111 mmol/L   CO2 26 22 - 32 mmol/L   Glucose, Bld 100 (H)  70 - 99 mg/dL   BUN 20 8 - 23 mg/dL   Creatinine, Ser 0.92 0.61 - 1.24 mg/dL   Calcium 8.6 (L) 8.9 - 10.3 mg/dL   GFR, Estimated >60 >60 mL/min   Anion gap 11 5 -  15  Osmolality     Status: Abnormal   Collection Time: 01/08/22 11:01 AM  Result Value Ref Range   Osmolality 261 (L) 275 - 295 mOsm/kg  Uric acid     Status: None   Collection Time: 01/08/22 11:01 AM  Result Value Ref Range   Uric Acid, Serum 6.3 3.7 - 8.6 mg/dL   US Venous Img Lower Bilateral (DVT)  Result Date: 01/07/2022 CLINICAL DATA:  Lower extremity swelling x3 days. EXAM: BILATERAL LOWER EXTREMITY VENOUS DOPPLER ULTRASOUND TECHNIQUE: Gray-scale sonography with graded compression, as well as color Doppler and duplex ultrasound were performed to evaluate the lower extremity deep venous systems from the level of the common femoral vein and including the common femoral, femoral, profunda femoral, popliteal and calf veins including the posterior tibial, peroneal and gastrocnemius veins when visible. The superficial great saphenous vein was also interrogated. Spectral Doppler was utilized to evaluate flow at rest and with distal augmentation maneuvers in the common femoral, femoral and popliteal veins. COMPARISON:  Lower extremity venous duplex, 01/10/2021 - POSITIVE for isolated DVT within the LEFT peroneal vein. FINDINGS: Suboptimal evaluation, with poor acoustic penetration secondary to patient habitus. RIGHT LOWER EXTREMITY VENOUS Normal compressibility of the RIGHT common femoral, superficial femoral, and popliteal veins, as well as the visualized portions of the calf veins. Visualized portions of profunda femoral vein and great saphenous vein unremarkable. No filling defects to suggest DVT on grayscale or color Doppler imaging. Doppler waveforms show normal direction of venous flow, normal respiratory plasticity and response to augmentation. OTHER Eccentric, echogenic wall foci at the proximal RIGHT femoral vein. This appears to  have been present on recent comparison (01/2021) study. No evidence of superficial thrombophlebitis or abnormal fluid collection. Limitations: Patient body habitus. Unable to visualize the calf veins. LEFT LOWER EXTREMITY VENOUS Normal compressibility of the LEFT common femoral, superficial femoral, and popliteal veins, as well as the visualized portions of the calf veins. Visualized portions of profunda femoral vein and great saphenous vein unremarkable. No filling defects to suggest DVT on grayscale or color Doppler imaging. Doppler waveforms show normal direction of venous flow, normal respiratory plasticity and response to augmentation. OTHER No evidence of superficial thrombophlebitis or abnormal fluid collection. Limitations: Patient body habitus. Unable to visualize the calf veins. IMPRESSION: Suboptimal evaluation, within these constraints; 1. No evidence of femoropopliteal DVT within either lower extremity. 2. Limited visualization of the bilateral lower extremity calf veins. Electronically Signed   By: Michaelle Birks M.D.   On: 01/07/2022 09:50   DG Chest 2 View  Result Date: 01/06/2022 CLINICAL DATA:  Pleural effusions and bilateral leg swelling. EXAM: CHEST - 2 VIEW COMPARISON:  Chest CT 01/14/2021 FINDINGS: The heart is enlarged but stable. Moderate tortuosity and calcification of the thoracic aorta. Mild chronic underlying lung changes. There are small bilateral pleural effusions and peripheral Kerley B lines suggesting mild interstitial edema. Streaky bibasilar atelectasis without definite infiltrates. The bony thorax is intact. IMPRESSION: Cardiac enlargement, mild interstitial edema, small bilateral pleural effusions and bibasilar atelectasis. Electronically Signed   By: Marijo Sanes M.D.   On: 01/06/2022 15:42     ASSESSMENT AND PLAN: Patient severely HOH. Denies chest pain, shortness of breath. Bilateral lower extremity edema. Echo results pending. Lasix drip started by Nephro, continue. Heart  rate currently elevated, has been up and to the bathroom with assistance. Continue metoprolol. Will continue to follow.   Engineer, drilling FNP-C

## 2022-01-08 NOTE — Assessment & Plan Note (Signed)
Likely hypervolemic hyponatremia.  Sodium improved from 120->122-> 123  nephrology starting Lasix drip

## 2022-01-08 NOTE — Progress Notes (Signed)
Physical Therapy Treatment Patient Details Name: Kirk Sanchez MRN: [id number] DOB: [birth year] Today's Date: 01/08/2022   History of Present Illness Kirk Sanchez is a 88 y.o. male with medical history significant for paroxysmal A-fib, chronic systolic CHF, hypertension, hypothyroidism, coronary artery disease status post stent angioplasty who presents to the ER for evaluation of bilateral lower extremity swelling, exertional shortness of breath and orthopnea for 3 days.  Patient's son states that patient used to be ambulatory with a cane and over the last 1 month started using a rolling walker within the last couple of days has been very weak and requires a 1 person assist to get around the house.  He has also had frequent falls.    PT Comments    Pt is well know by [place] [facility] outside hospital (church members). Pt is not currently at baseline cognition or functional mobility. He was on 2 L o2 but was able to remain on rm air during session with sao2 > 93%. Pt is HOH. Assisted with placement of hear aide to L ear. Slightly improved hearing but pt usually requires family/friends to write down information to be communicated. Did contact pt's son post session to discuss DC disposition. Pt has been to [facility] in past and family interesting in him pursuing rehab again at [place]. He was able to get out of R side of bed, stand to RW, and easily ambulate around room. Cognition limits session more so than physical limits. HR was in A fib between 90s-135 bpm. Acute PT will continue to follow and progress as able per current POC.     Recommendations for follow up therapy are one component of a multi-disciplinary discharge planning process, led by the attending physician.  Recommendations may be updated based on patient status, additional functional criteria and insurance authorization.  Follow Up Recommendations  Home health PT     Assistance Recommended at Discharge Frequent or constant  Supervision/Assistance  Patient can return home with the following A little help with walking and/or transfers;A little help with bathing/dressing/bathroom;Assistance with cooking/housework;Assist for transportation;Help with stairs or ramp for entrance;Direct supervision/assist for medications management   Equipment Recommendations  None recommended by PT       Precautions / Restrictions Precautions Precautions: Fall Restrictions Weight Bearing Restrictions: No     Mobility  Bed Mobility Overal bed mobility: Needs Assistance Bed Mobility: Supine to Sit, Sit to Supine     Supine to sit: HOB elevated, Min assist     General bed mobility comments: Pt was able to exit R sid eof bed with min assist + increased time. Author assisted pt with placement of hearing aides prior to getting up.    Transfers Overall transfer level: Needs assistance Equipment used: Rolling walker (2 wheels) Transfers: Sit to/from Stand Sit to Stand: Min assist           General transfer comment: bed height elevated.Vc+ TCs for safety and improved technique    Ambulation/Gait Ambulation/Gait assistance: Min guard, Min assist Gait Distance (Feet): 20 Feet Assistive device: Rolling walker (2 wheels) Gait Pattern/deviations: Step-through pattern, Trunk flexed Gait velocity: decr     General Gait Details: pt was easily able to ambulate with Rw aropund room. He could have easily ambulated increased distances but pt requesting to sit in recliner.    Balance Overall balance assessment: Needs assistance, History of Falls Sitting-balance support: Feet supported Sitting balance-Leahy Scale: Fair     Standing balance support: Bilateral upper extremity supported, During functional activity  Standing balance-Leahy Scale: Fair     Cognition Arousal/Alertness: Awake/alert Behavior During Therapy: WFL for tasks assessed/performed Overall Cognitive Status: Within Functional Limits for tasks assessed               Pertinent Vitals/Pain Pain Assessment Pain Assessment: No/denies pain     PT Goals (current goals can now be found in the care plan section) Acute Rehab PT Goals Patient Stated Goal: none stated Progress towards PT goals: Progressing toward goals    Frequency    Min 2X/week      PT Plan Current plan remains appropriate       AM-PAC PT "6 Clicks" Mobility   Outcome Measure  Help needed turning from your back to your side while in a flat bed without using bedrails?: A Little Help needed moving from lying on your back to sitting on the side of a flat bed without using bedrails?: A Lot Help needed moving to and from a bed to a chair (including a wheelchair)?: A Lot Help needed standing up from a chair using your arms (e.g., wheelchair or bedside chair)?: A Lot Help needed to walk in hospital room?: A Little Help needed climbing 3-5 steps with a railing? : A Lot 6 Click Score: 14    End of Session Equipment Utilized During Treatment: Oxygen (removed o2 during session with sao2 > 93%) Activity Tolerance: Patient tolerated treatment well Patient left: in chair;with call bell/phone within reach;with chair alarm set;with nursing/sitter in room Nurse Communication: Mobility status;Precautions PT Visit Diagnosis: Unsteadiness on feet (R26.81);Other abnormalities of gait and mobility (R26.89);Muscle weakness (generalized) (M62.81);Difficulty in walking, not elsewhere classified (R26.2);History of falling (Z91.81)     Time: [id number] PT Time Calculation (min) (ACUTE ONLY): 24 min  Charges:  $Gait Training: 8-22 mins $Therapeutic Activity: 8-22 mins                     Julaine Fusi PTA 01/08/22, 8:55 AM

## 2022-01-08 NOTE — Progress Notes (Signed)
Progress Note   Patient: Kirk Sanchez DOB: [birth year] DOA: 01/06/2022     2 DOS: the patient was seen and examined on 01/08/2022   Brief hospital course: 88 y.o. male with medical history significant for paroxysmal A-fib, chronic systolic CHF, hypertension, hypothyroidism, coronary artery disease status post stent angioplasty admitted for CHF exacerbation  6/8: Palliative care consult.  Ongoing diuresis, PT OT eval 6/9: Nephro consult for hyponatremia.  Echo, Lasix drip.  Cardio consult.  PT OT recommended home health   Assessment and Plan: Acute on chronic systolic CHF (congestive heart failure) (HCC) Patient presents for evaluation of exertional shortness of breath, bilateral lower extremity swelling and orthopnea Imaging shows cardiac enlargement, mild interstitial edema, small bilateral pleural effusions and bibasilar atelectasis. BNP is elevated Nephro started Lasix drip Consult cardiology Net IO Since Admission: -1,135 mL [01/08/22 1238]  Acute respiratory failure with hypoxia (HCC) Secondary to acute systolic CHF Patient had room air pulse oximetry of 85% and was initially on 4 L of oxygen to maintain pulse oximetry greater than 92% Weaned off to 3 L oxygen  Fall Patient has had multiple falls at home and appears to be very weak and deconditioned by his son. He currently requires a 1 person assist to get around and initially used a rolling walker PT and OT recommends home health and likely 24/7 supervision He also had unwitnessed fall on 6/8 evening.  No injury per nursing  Hyponatremia with excess extracellular fluid volume Likely hypervolemic hyponatremia.  Sodium improved from 120->122-> 123  nephrology starting Lasix drip    Ischemic cardiomyopathy Patient has ischemic cardiomyopathy with last known LVEF of 35 to 40% from a 2D echocardiogram which was done in 06/22 2D echocardiogram shows decreased LV function with global hypokinesis. Started on  Toprol 25 mg p.o. daily.  Repeat echo and starting Lasix drip per nephrology  Atrial fibrillation (HCC) Continue apixaban as primary prophylaxis for an acute stroke -considering multiple falls this may need to be reconsidered as he may not be a good candidate for Eliquis.  Heart rate controlled with metoprolol   Advance care planning Palliative care seen.  DNR for now.  Outpatient palliative care.  Overall poor prognosis.  He does have.'s of agitation especially in the evening.   as needed Haldol  Hypothyroidism TSH within normal limits Continue Synthroid        Subjective: Got agitated last night.  Also had fall which was unwitnessed last evening.  Now sitting in the chair.  Wants to get in the bed and is anxious about it.  Sitter at bedside  Physical Exam: Vitals:   01/08/22 0527 01/08/22 0803 01/08/22 0830 01/08/22 1220  BP: 112/79 109/88  113/63  Pulse:  (!) 110  80  Resp: 20 16  20   Temp: 98.7 F (37.1 C)     TempSrc: Axillary     SpO2: 93%  93% 95%  Weight:      Height:       Constitutional:  Appearance: Normal appearance.  Comments: Frail HENT:  Head: Normocephalicand atraumatic.  Nose: Nose normal.  Mouth/Throat:  Mouth: Mucous membranes are moist.  Eyes:  Pupils: Pupils are equal, round, and reactive to light.  Cardiovascular:  Rate and Rhythm: Regular Pulmonary:  Effort: Pulmonary effort is normal.  Breath sounds: Decreased breath sounds.  Abdominal:  General: Abdomen is flat. Bowel sounds arenormal.  Palpations: Abdomen is soft.  Musculoskeletal:  Cervical back: Normal range of motionand neck supple.  Right lower leg: Edemapresent.  Left lower leg: Edemapresent.  Skin: General: Skin is warmand dry.  Neurological:  Mental Status: He is alertand oriented to person, place, and time.  Motor: Irwin Brakeman.  Psychiatric:  Mood and Affect: Moodanxious Behavior:  Agitated  Data Reviewed:  Sodium 123  Family Communication: Updated son Kirk Sanchez over phone  Disposition: Status is: Inpatient Remains inpatient appropriate because: Ongoing diuresis with Lasix drip.  Sodium management   Planned Discharge Destination: Home with Home Health    DVT prophylaxis-Eliquis Time spent: 35 minutes  Author: Delfino Lovett, MD 01/08/2022 12:41 PM  For on call review [email or web address].

## 2022-01-08 NOTE — Assessment & Plan Note (Signed)
Patient has had multiple falls at home and appears to be very weak and deconditioned by his son. He currently requires a 1 person assist to get around and initially used a rolling walker PT and OT recommends home health and likely 24/7 supervision He also had unwitnessed fall on 6/8 evening.  No injury per nursing

## 2022-01-08 NOTE — TOC Initial Note (Addendum)
Transition of Care [facility]) - Initial/Assessment Note    Patient Details  Name: STEPFON Sanchez MRN: [id number] Date of Birth: Jan 10, 1925  [facility] CM/SW Contact:    Truddie Hidden, RN Phone Number: 01/08/2022, 12:19 PM  Clinical Narrative:                 Spoke with patient's son,Kirk Sanchez in reference to PT recommendation for home health. Kirk Sanchez stated he was more interested in the patient going to a facility but agreeable to HHPT. Prefers [facility].  4:09 Bed search initiated         Patient Goals and CMS Choice        Expected Discharge Plan and Services                                                Prior Living Arrangements/Services                       Activities of Daily Living      Permission Sought/Granted                  Emotional Assessment              Admission diagnosis:  Peripheral edema [R60.9] Acute on chronic systolic congestive heart failure (HCC) [I50.23] Acute on chronic systolic CHF (congestive heart failure) (HCC) [I50.23] Patient Active Problem List   Diagnosis Date Noted   Acute on chronic systolic CHF (congestive heart failure) (HCC) 01/06/2022   Fall 01/06/2022   Acute respiratory failure with hypoxia (HCC) 01/06/2022   Atherosclerosis of aorta (HCC) 12/07/2021   Hyponatremia with excess extracellular fluid volume 01/27/2021   Chronic combined systolic and diastolic congestive heart failure (HCC) 08/09/2019   Ischemic cardiomyopathy 08/09/2019   Senile purpura (HCC) 02/06/2019   DNR (do not resuscitate)    Right shoulder tendonitis 03/08/2017   Atrial fibrillation (HCC) 03/06/2016   Presbycusis of both ears 03/06/2016   Advance care planning 03/03/2016   Anemia 03/03/2016   Arteriosclerosis of coronary artery 01/28/2015   Cardiac murmur 01/28/2015   HLD (hyperlipidemia) 01/28/2015   Hypothyroidism 01/28/2015   PCP:  Alba Cory, MD Pharmacy:   [facility] [place] [address] -  [address] [place] [address] Phone: [phone] Fax: [phone]     Social Determinants of Health (SDOH) Interventions    Readmission Risk Interventions     No data to display

## 2022-01-08 NOTE — Progress Notes (Signed)
Occupational Therapy Treatment Patient Details Name: Kirk HeadsHoward H Scinto MRN: [id number] DOB: [birth year] Today's Date: 01/08/2022   History of present illness Kirk Sanchez is a 88 y.o. male with medical history significant for paroxysmal A-fib, chronic systolic CHF, hypertension, hypothyroidism, coronary artery disease status post stent angioplasty who presents to the ER for evaluation of bilateral lower extremity swelling, exertional shortness of breath and orthopnea for 3 days.  Patient's son states that patient used to be ambulatory with a cane and over the last 1 month started using a rolling walker within the last couple of days has been very weak and requires a 1 person assist to get around the house.  He has also had frequent falls.   OT comments  Son present in room upon OT arrival.  Son verbalizing that he will be able to provide 24 hour supv upon discharge from hospital, but that assisting with pt's care at pt's current level would be difficult.  Son is in agreement of SNF recommendation if pt were to d/c in current state.  OT encouraged son that home health could also be very realistic as mobility is likely to improve with continued fluid loss.  OT assured son that therapy would continue to follow in the acute setting and adjust recommendations as needed.  At current state would benefit from SNF to reduce physical burden on son.  Pt required mod A for supine<>sit, min A sit to stand from bedside, and mod A sit<>stand from toilet, max cues for technique and hand placement, with multiple rocking attempts to complete sit to stand.  Would benefit from 3in1 over toilet.  Son stated that pt does have  toilet rails and a higher toilet at home.  Will continue to follow to work towards OT goals/poc.  Left pt in bed with sitter and son present, alarm set, and all necessary items within reach.  Inconsistent 02 sat readings d/t cold hands.  Sitter was taking vitals upon OT exit.    Recommendations for follow  up therapy are one component of a multi-disciplinary discharge planning process, led by the attending physician.  Recommendations may be updated based on patient status, additional functional criteria and insurance authorization.    Follow Up Recommendations  Home health OT    Assistance Recommended at Discharge Frequent or constant Supervision/Assistance  Patient can return home with the following  A lot of help with walking and/or transfers;A lot of help with bathing/dressing/bathroom;Assistance with cooking/housework;Help with stairs or ramp for entrance   Equipment Recommendations  BSC/3in1    Recommendations for Other Services      Precautions / Restrictions Precautions Precautions: Fall Restrictions Weight Bearing Restrictions: No       Mobility Bed Mobility Overal bed mobility: Needs Assistance Bed Mobility: Supine to Sit, Sit to Supine     Supine to sit: HOB elevated, Mod assist Sit to supine: Mod assist   General bed mobility comments: assist to transition trunk and LEs with supine<>sit, cues for sequencing and technique. Patient Response: Cooperative  Transfers Overall transfer level: Needs assistance Equipment used: Rolling walker (2 wheels) Transfers: Sit to/from Stand Sit to Stand: Min assist           General transfer comment: min A from bed, cues for hand placement, mod A from commode (would benefit from 3in1 over toilet)     Balance Overall balance assessment: Needs assistance, History of Falls Sitting-balance support: Feet supported Sitting balance-Leahy Scale: Fair     Standing balance support: Bilateral upper extremity  supported, During functional activity Standing balance-Leahy Scale: Fair Standing balance comment: supv for safe hand placement on RW instead of cabinet door at bedside             High level balance activites: Direction changes, Turns High Level Balance Comments: min A           ADL either performed or assessed  with clinical judgement   ADL Overall ADL's : Needs assistance/impaired                         Toilet Transfer: Moderate assistance;Ambulation;Rolling walker (2 wheels);Grab bars Toilet Transfer Details (indicate cue type and reason): cues for hand placement; poorly controlled descent, multiple rocking attempts for sit to stand from toilet Toileting- Clothing Manipulation and Hygiene: Minimal assistance       Functional mobility during ADLs: Minimal assistance;Rolling walker (2 wheels) General ADL Comments: pt initially sat on commode to attempt voiding, but was unsuccessful.  Pt then stood to try to void, holding on to top of commode near flusher, but was unsuccesful to void in standing.  Min guard for dynamic standing balance when reaching inside BOS.    Extremity/Trunk Assessment Upper Extremity Assessment Upper Extremity Assessment: Generalized weakness   Lower Extremity Assessment Lower Extremity Assessment: Generalized weakness   Cervical / Trunk Assessment Cervical / Trunk Assessment: Normal    Vision Baseline Vision/History: 1 Wears glasses Patient Visual Report: No change from baseline                Cognition Arousal/Alertness: Awake/alert Behavior During Therapy: WFL for tasks assessed/performed Overall Cognitive Status: Within Functional Limits for tasks assessed                                          Exercises Total Joint Exercises Ankle Circles/Pumps: AROM, 10 reps, Supine    Shoulder Instructions       General Comments OT doffed socks d/t LE edema and socks digging into lower legs.  OT elevated feet on 2 pillows and encouraged ankle pumps with max cues for good return demo.    Pertinent Vitals/ Pain       Pain Assessment Pain Assessment: No/denies pain                                                          Frequency  Min 2X/week        Progress Toward Goals  OT Goals(current goals  can now be found in the care plan section)  Progress towards OT goals: Progressing toward goals  Acute Rehab OT Goals Patient Stated Goal: to go home OT Goal Formulation: With patient Time For Goal Achievement: 01/21/22 Potential to Achieve Goals: Good  Plan Discharge plan remains appropriate                     AM-PAC OT "6 Clicks" Daily Activity     Outcome Measure   Help from another person eating meals?: None Help from another person taking care of personal grooming?: A Little Help from another person toileting, which includes using toliet, bedpan, or urinal?: A Lot Help from another person bathing (including washing, rinsing, drying)?: A Lot  Help from another person to put on and taking off regular upper body clothing?: None Help from another person to put on and taking off regular lower body clothing?: A Lot 6 Click Score: 17    End of Session Equipment Utilized During Treatment: Rolling walker (2 wheels);Gait belt  OT Visit Diagnosis: Other abnormalities of gait and mobility (R26.89);Muscle weakness (generalized) (M62.81)   Activity Tolerance Patient tolerated treatment well   Patient Left in bed;with call bell/phone within reach;with bed alarm set;with family/visitor present             Time: 1550-1620 OT Time Calculation (min): 30 min  Charges: OT General Charges $OT Visit: 1 Visit OT Treatments $Self Care/Home Management : 23-37 mins  Danelle Earthly, MS, OTR/L   Otis Dials 01/08/2022, 4:41 PM

## 2022-01-08 NOTE — Assessment & Plan Note (Signed)
Palliative care seen.  DNR for now.  Outpatient palliative care.  Overall poor prognosis.  He does have.'s of agitation especially in the evening.   as needed Haldol

## 2022-01-09 DIAGNOSIS — J9601 Acute respiratory failure with hypoxia: Secondary | ICD-10-CM | POA: Diagnosis not present

## 2022-01-09 DIAGNOSIS — Z7189 Other specified counseling: Secondary | ICD-10-CM | POA: Diagnosis not present

## 2022-01-09 DIAGNOSIS — I5023 Acute on chronic systolic (congestive) heart failure: Secondary | ICD-10-CM | POA: Diagnosis not present

## 2022-01-09 DIAGNOSIS — I480 Paroxysmal atrial fibrillation: Secondary | ICD-10-CM | POA: Diagnosis not present

## 2022-01-09 LAB — BASIC METABOLIC PANEL
Anion gap: 12 (ref 5–15)
BUN: 18 mg/dL (ref 8–23)
CO2: 28 mmol/L (ref 22–32)
Calcium: 8.2 mg/dL — ABNORMAL LOW (ref 8.9–10.3)
Chloride: 83 mmol/L — ABNORMAL LOW (ref 98–111)
Creatinine, Ser: 0.78 mg/dL (ref 0.61–1.24)
GFR, Estimated: >60 mL/min (ref >60)
Glucose, Bld: 104 mg/dL — ABNORMAL HIGH (ref 70–99)
Potassium: 3.1 mmol/L — ABNORMAL LOW (ref 3.5–5.1)
Sodium: 123 mmol/L — ABNORMAL LOW (ref 135–145)

## 2022-01-09 LAB — CBC
HCT: 34.3 % — ABNORMAL LOW (ref 39.0–52.0)
Hemoglobin: 11.2 g/dL — ABNORMAL LOW (ref 13.0–17.0)
MCH: 26.1 pg (ref 26.0–34.0)
MCHC: 32.7 g/dL (ref 30.0–36.0)
MCV: 80 fL (ref 80.0–100.0)
Platelets: 188 10*3/uL (ref 150–400)
RBC: 4.29 MIL/uL (ref 4.22–5.81)
RDW: 16.8 % — ABNORMAL HIGH (ref 11.5–15.5)
WBC: 9.7 10*3/uL (ref 4.0–10.5)
nRBC: 0 % (ref 0.0–0.2)

## 2022-01-09 LAB — MAGNESIUM: Magnesium: 1.5 mg/dL — ABNORMAL LOW (ref 1.7–2.4)

## 2022-01-09 MED ORDER — RISPERIDONE 0.5 MG PO TABS
0.5000 mg | ORAL_TABLET | Freq: Every day | ORAL | Status: DC
  Administered 2022-01-09 – 2022-01-11 (×3): 0.5 mg via ORAL
  Filled 2022-01-09 (×3): qty 1

## 2022-01-09 MED ORDER — MAGNESIUM SULFATE 2 GM/50ML IV SOLN
2.0000 g | Freq: Once | INTRAVENOUS | Status: AC
  Administered 2022-01-09: 2 g via INTRAVENOUS
  Filled 2022-01-09: qty 50

## 2022-01-09 MED ORDER — LOSARTAN POTASSIUM 25 MG PO TABS
25.0000 mg | ORAL_TABLET | Freq: Every day | ORAL | Status: DC
  Administered 2022-01-09: 25 mg via ORAL
  Filled 2022-01-09 (×2): qty 1

## 2022-01-09 MED ORDER — POTASSIUM CHLORIDE CRYS ER 20 MEQ PO TBCR
40.0000 meq | EXTENDED_RELEASE_TABLET | Freq: Once | ORAL | Status: AC
  Administered 2022-01-09: 40 meq via ORAL
  Filled 2022-01-09: qty 2

## 2022-01-09 MED ORDER — HALOPERIDOL LACTATE 5 MG/ML IJ SOLN
1.0000 mg | Freq: Once | INTRAMUSCULAR | Status: AC
  Administered 2022-01-09: 1 mg via INTRAVENOUS
  Filled 2022-01-09: qty 1

## 2022-01-09 NOTE — Assessment & Plan Note (Signed)
Secondary to acute systolic CHF Patient had room air pulse oximetry of 85% and was initially on 4 L of oxygen to maintain pulse oximetry greater than 92% Weaned off to 3 L oxygen.

## 2022-01-09 NOTE — Assessment & Plan Note (Signed)
Patient has had multiple falls at home and appears to be very weak and deconditioned by his son. He currently requires a 1 person assist to get around and initially used a rolling walker PT and OT recommends home health and likely 24/7 supervision He also had unwitnessed fall on 6/8 evening.  No injury per nursing.  Overall poor prognosis.  Palliative care has evaluated him

## 2022-01-09 NOTE — Progress Notes (Signed)
SUBJECTIVE: Patient appears to be comfortable and is hard of hearing   Vitals:   01/08/22 2337 01/09/22 0202 01/09/22 0409 01/09/22 0828  BP: 96/71  (!) 105/53 (!) 102/56  Pulse: (!) 56  82 67  Resp: 19  15   Temp: 97.7 F (36.5 C)  (!) 97.3 F (36.3 C) (!) 97 F (36.1 C)  TempSrc: Axillary  Oral   SpO2: 100%  97% (!) 85%  Weight:  77 kg    Height:        Intake/Output Summary (Last 24 hours) at 01/09/2022 1055 Last data filed at 01/09/2022 6301 Gross per 24 hour  Intake 266.87 ml  Output 2425 ml  Net -2158.13 ml    LABS: Basic Metabolic Panel: Recent Labs    01/08/22 0746 01/09/22 0751  NA 123* 123*  K 3.9 3.1*  CL 86* 83*  CO2 26 28  GLUCOSE 100* 104*  BUN 20 18  CREATININE 0.92 0.78  CALCIUM 8.6* 8.2*  MG  --  1.5*   Liver Function Tests: Recent Labs    01/06/22 1518  AST 65*  ALT 24  ALKPHOS 85  BILITOT 1.7*  PROT 7.0  ALBUMIN 3.8   No results for input(s): "LIPASE", "AMYLASE" in the last 72 hours. CBC: Recent Labs    01/06/22 1518 01/07/22 0706 01/08/22 0746 01/09/22 0751  WBC 8.1   < > 10.7* 9.7  NEUTROABS 7.0  --   --   --   HGB 11.7*   < > 11.8* 11.2*  HCT 38.0*   < > 37.8* 34.3*  MCV 83.7   < > 81.3 80.0  PLT 240   < > 223 188   < > = values in this interval not displayed.   Cardiac Enzymes: No results for input(s): "CKTOTAL", "CKMB", "CKMBINDEX", "TROPONINI" in the last 72 hours. BNP: Invalid input(s): "POCBNP" D-Dimer: No results for input(s): "DDIMER" in the last 72 hours. Hemoglobin A1C: No results for input(s): "HGBA1C" in the last 72 hours. Fasting Lipid Panel: No results for input(s): "CHOL", "HDL", "LDLCALC", "TRIG", "CHOLHDL", "LDLDIRECT" in the last 72 hours. Thyroid Function Tests: Recent Labs    01/06/22 1518  TSH 4.063   Anemia Panel: No results for input(s): "VITAMINB12", "FOLATE", "FERRITIN", "TIBC", "IRON", "RETICCTPCT" in the last 72 hours.   PHYSICAL EXAM General: Well developed, well nourished, in no  acute distress HEENT:  Normocephalic and atramatic Neck:  No JVD.  Lungs: Clear bilaterally to auscultation and percussion. Heart: HRRR . Normal S1 and S2 without gallops or murmurs.  Abdomen: Bowel sounds are positive, abdomen soft and non-tender  Msk:  Back normal, normal gait. Normal strength and tone for age. Extremities: No clubbing, cyanosis or edema.   Neuro: Alert and oriented X 3. Psych:  Good affect, responds appropriately  TELEMETRY: Atrial fibrillation with controlled rate  ASSESSMENT AND PLAN: HFrEF with left ventricular ejection fraction severely depressed about 30% with underlying chronic atrial fibrillation.  Advise adding small dose of losartan 25 mg and consider adding spironolactone 12.5 once a day.  Consider changing Lasix drip to p.o. Lasix 20 twice daily.  Active Problems:   Hypothyroidism   Advance care planning   Atrial fibrillation (HCC)   Ischemic cardiomyopathy   Hyponatremia with excess extracellular fluid volume   Acute on chronic systolic CHF (congestive heart failure) (HCC)   Fall   Acute respiratory failure with hypoxia (HCC)    Cassundra Mckeever A, MD, [facility] 01/09/2022 10:55 AM

## 2022-01-09 NOTE — Assessment & Plan Note (Signed)
Likely hypervolemic hyponatremia.  Sodium improved from 120->122-> 123 Continue Lasix drip per nephrology

## 2022-01-09 NOTE — Assessment & Plan Note (Signed)
Palliative care seen.  DNR for now.  Outpatient palliative care.  Overall poor prognosis.  He does have.'s of agitation especially in the evening.   as needed Haldol.  Safety sitter at bedside

## 2022-01-09 NOTE — Progress Notes (Signed)
Progress Note   Patient: Kirk Sanchez:[id number] DOB: 03/03/1925 DOA: 01/06/2022     3 DOS: the patient was seen and examined on 01/09/2022   Brief hospital course: 88 y.o. male with medical history significant for paroxysmal A-fib, chronic systolic CHF, hypertension, hypothyroidism, coronary artery disease status post stent angioplasty admitted for CHF exacerbation  6/8: Palliative care consult.  Ongoing diuresis, PT OT eval 6/9: Nephro consult for hyponatremia.  Echo, Lasix drip.  Cardio consult.  PT OT recommended home health 6/20: Agitation continues especially in the evening hours.  Echo showing LVEF of 25 to 30%    Assessment and Plan: Acute on chronic systolic CHF (congestive heart failure) (HCC) Patient presents for evaluation of exertional shortness of breath, bilateral lower extremity swelling and orthopnea Imaging shows cardiac enlargement, mild interstitial edema, small bilateral pleural effusions and bibasilar atelectasis. BNP is elevated Continue Lasix drip Nephro and cardio following Net IO Since Admission: -3,293.13 mL [01/09/22 1237]  Acute respiratory failure with hypoxia (HCC) Secondary to acute systolic CHF Patient had room air pulse oximetry of 85% and was initially on 4 L of oxygen to maintain pulse oximetry greater than 92% Weaned off to 3 L oxygen.  Fall Patient has had multiple falls at home and appears to be very weak and deconditioned by his son. He currently requires a 1 person assist to get around and initially used a rolling walker PT and OT recommends home health and likely 24/7 supervision He also had unwitnessed fall on 6/8 evening.  No injury per nursing.  Overall poor prognosis.  Palliative care has evaluated him  Hyponatremia with excess extracellular fluid volume Likely hypervolemic hyponatremia.  Sodium improved from 120->122-> 123 Continue Lasix drip per nephrology    Ischemic cardiomyopathy Patient has ischemic cardiomyopathy  with last known LVEF of 35 to 40% from a 2D echocardiogram which was done in 06/22 Continue Toprol 25 mg p.o. daily.  Repeat echo shows LVEF of 25 to 30%, global hypokinesis and severely decreased LV systolic function, continue Lasix drip per nephrology  Atrial fibrillation (HCC) Continue apixaban as primary prophylaxis for an acute stroke -considering multiple falls this may need to be reconsidered as he may not be a good candidate for Eliquis.  Heart rate controlled with metoprolol.   Advance care planning Palliative care seen.  DNR for now.  Outpatient palliative care.  Overall poor prognosis.  He does have.'s of agitation especially in the evening.   as needed Haldol.  Safety sitter at bedside    Hypothyroidism TSH within normal limits Continue Synthroid.        Subjective: Very hard of hearing.  Gets agitated and combative in the evening hours.  Sitter at bedside  Physical Exam: Vitals:   01/08/22 2337 01/09/22 0202 01/09/22 0409 01/09/22 0828  BP: 96/71  (!) 105/53 (!) 102/56  Pulse: (!) 56  82 67  Resp: 19  15   Temp: 97.7 F (36.5 C)  (!) 97.3 F (36.3 C) (!) 97 F (36.1 C)  TempSrc: Axillary  Oral   SpO2: 100%  97% (!) 85%  Weight:  77 kg    Height:       88 year old male sitting in the chair looking quite frail and hard of hearing Cardiovascular:  Rate and Rhythm:Regular Pulmonary: Clear to auscultation bilaterally Abdominal: Soft, benign Musculoskeletal: Trace edema in bilateral lower extremity Skin: warmand dry.  Neurological:  Awake and alert,Nonfocal exam Psychiatric:Mood and Affect: MoodanxiousBehavior: Agitated  Data Reviewed:  Magnesium 1.5, potassium 3.1,  sodium 123  Family Communication: Son was updated yesterday over phone  Disposition: Status is: Inpatient Remains inpatient appropriate because: Electrolyte management and ongoing diuresis   Planned Discharge Destination: Home with Home Health    DVT prophylaxis-Eliquis Time  spent: 35 minutes  Author: Delfino Lovett, MD 01/09/2022 12:37 PM  For on call review [email or web address].

## 2022-01-09 NOTE — Assessment & Plan Note (Signed)
Patient presents for evaluation of exertional shortness of breath, bilateral lower extremity swelling and orthopnea Imaging shows cardiac enlargement, mild interstitial edema, small bilateral pleural effusions and bibasilar atelectasis. BNP is elevated Continue Lasix drip Nephro and cardio following Net IO Since Admission: -3,293.13 mL [01/09/22 1237]

## 2022-01-09 NOTE — Progress Notes (Addendum)
Pt Qtc at 410 per ccmd. Will continue to monitor.  Update 0538: Pt is trying to pull lines, swinging arms in the rails, hard to re-direct and getting agitated. NP Jon Billings made aware and ordered 1 mg HALDOL IV once. Will continue to monitor.  Update 0546: Qtc per ccmd at 422. Will continue to monitor.

## 2022-01-09 NOTE — Assessment & Plan Note (Signed)
TSH within normal limits.  Continue Synthroid. 

## 2022-01-09 NOTE — Progress Notes (Signed)
[facility]  PROGRESS NOTE   Subjective:   Patient is out of bed to chair.  Aide at bedside. He ate better today.  On Lasix drip at 6 mg an hour.  Objective:  Vital signs: Blood pressure (!) 102/56, pulse 67, temperature (!) 97 F (36.1 C), temperature source Oral, resp. rate 15, height 5\' 7"  (1.702 m), weight 77 kg, SpO2 (!) 88 %.  Intake/Output Summary (Last 24 hours) at 01/09/2022 1412 Last data filed at 01/09/2022 0635 Gross per 24 hour  Intake 266.87 ml  Output 2425 ml  Net -2158.13 ml   Filed Weights   01/07/22 0200 01/08/22 0300 01/09/22 0202  Weight: 81.1 kg 58.7 kg 77 kg     Physical Exam: General:  No acute distress  Head:  Normocephalic, atraumatic. Moist oral mucosal membranes  Eyes:  Anicteric  Neck:  Supple  Lungs:   Clear to auscultation, normal effort  Heart:  S1S2 no rubs  Abdomen:   Soft, nontender, bowel sounds present  Extremities:  peripheral edema.  Neurologic:  Awake, alert, following commands  Skin:  No lesions  Access:     Basic Metabolic Panel: Recent Labs  Lab 01/06/22 1518 01/07/22 0706 01/08/22 0746 01/09/22 0751  NA 120* 122* 123* 123*  K 5.3* 4.4 3.9 3.1*  CL 87* 88* 86* 83*  CO2 23 24 26 28   GLUCOSE 110* 88 100* 104*  BUN 19 19 20 18   CREATININE 0.90 0.80 0.92 0.78  CALCIUM 9.1 8.5* 8.6* 8.2*  MG  --   --   --  1.5*    CBC: Recent Labs  Lab 01/06/22 1518 01/07/22 0706 01/08/22 0746 01/09/22 0751  WBC 8.1 7.6 10.7* 9.7  NEUTROABS 7.0  --   --   --   HGB 11.7* 11.0* 11.8* 11.2*  HCT 38.0* 34.9* 37.8* 34.3*  MCV 83.7 81.7 81.3 80.0  PLT 240 202 223 188     Urinalysis: No results for input(s): "COLORURINE", "LABSPEC", "PHURINE", "GLUCOSEU", "HGBUR", "BILIRUBINUR", "KETONESUR", "PROTEINUR", "UROBILINOGEN", "NITRITE", "LEUKOCYTESUR" in the last 72 hours.  Invalid input(s): "APPERANCEUR"    Imaging: ECHOCARDIOGRAM COMPLETE  Result Date: 01/08/2022    ECHOCARDIOGRAM REPORT   Patient Name:   Kirk Sanchez Date of Exam: 01/08/2022 Medical Rec #:  [id number]      Height:       76.0 in Accession #:    [id number]     Weight:       129.4 lb Date of Birth:  11/10/1924     BSA:          1.841 m Patient Age:    88 years       BP:           113/63 mmHg Patient Gender: M              HR:           80 bpm. Exam Location:  [facility] Procedure: 2D Echo, Color Doppler and Cardiac Doppler Indications:     CHF-acute systolic [id number]  History:         Patient has prior history of Echocardiogram examinations, most                  recent 01/11/2021. CHF, Arrythmias:Atrial Fibrillation; Risk                  Factors:Hypertension.  Sonographer:     Sherrie Sport Referring Phys:  [place] Diagnosing Phys: [place]  Comments: Suboptimal apical window. IMPRESSIONS  1. Left ventricular ejection fraction, by estimation, is 25 to 30%. The left ventricle has severely decreased function. The left ventricle demonstrates global hypokinesis. The left ventricular internal cavity size was moderately dilated. There is mild concentric left ventricular hypertrophy. Left ventricular diastolic parameters are indeterminate.  2. Right ventricular systolic function is moderately reduced. The right ventricular size is moderately enlarged.  3. Left atrial size was severely dilated.  4. Right atrial size was moderately dilated.  5. The mitral valve is normal in structure. Moderate mitral valve regurgitation. No evidence of mitral stenosis.  6. Tricuspid valve regurgitation is moderate.  7. The aortic valve is normal in structure. Aortic valve regurgitation is mild to moderate. Aortic valve sclerosis/calcification is present, without any evidence of aortic stenosis.  8. The inferior vena cava is normal in size with greater than 50% respiratory variability, suggesting right atrial pressure of 3 mmHg. FINDINGS  Left Ventricle: Left ventricular ejection fraction, by estimation, is 25 to 30%. The left ventricle has severely decreased function.  The left ventricle demonstrates global hypokinesis. The left ventricular internal cavity size was moderately dilated. There is mild concentric left ventricular hypertrophy. Left ventricular diastolic parameters are indeterminate. Right Ventricle: The right ventricular size is moderately enlarged. No increase in right ventricular wall thickness. Right ventricular systolic function is moderately reduced. Left Atrium: Left atrial size was severely dilated. Right Atrium: Right atrial size was moderately dilated. Pericardium: There is no evidence of pericardial effusion. Mitral Valve: The mitral valve is normal in structure. Moderate mitral valve regurgitation. No evidence of mitral valve stenosis. Tricuspid Valve: The tricuspid valve is normal in structure. Tricuspid valve regurgitation is moderate . No evidence of tricuspid stenosis. Aortic Valve: The aortic valve is normal in structure. Aortic valve regurgitation is mild to moderate. Aortic valve sclerosis/calcification is present, without any evidence of aortic stenosis. Aortic valve mean gradient measures 1.0 mmHg. Aortic valve peak gradient measures 1.6 mmHg. Aortic valve area, by VTI measures 2.36 cm. Pulmonic Valve: The pulmonic valve was normal in structure. Pulmonic valve regurgitation is trivial. No evidence of pulmonic stenosis. Aorta: The aortic root is normal in size and structure. Venous: The inferior vena cava is normal in size with greater than 50% respiratory variability, suggesting right atrial pressure of 3 mmHg. IAS/Shunts: No atrial level shunt detected by color flow Doppler.  LEFT VENTRICLE PLAX 2D LVIDd:         4.30 cm LVIDs:         3.90 cm LV PW:         1.80 cm LV IVS:        1.35 cm LVOT diam:     2.00 cm LV SV:         19 LV SV Index:   11 LVOT Area:     3.14 cm  RIGHT VENTRICLE RV Basal diam:  4.40 cm LEFT ATRIUM             Index        RIGHT ATRIUM           Index LA diam:        3.30 cm 1.79 cm/m   RA Area:     28.10 cm LA Vol (A2C):    78.0 ml 42.37 ml/m  RA Volume:   95.10 ml  51.66 ml/m LA Vol (A4C):   41.0 ml 22.27 ml/m LA Biplane Vol: 59.5 ml 32.32 ml/m  AORTIC VALVE  PULMONIC VALVE AV Area (Vmax):    2.24 cm     PV Vmax:        0.86 m/s AV Area (Vmean):   2.07 cm     PV Vmean:       57.200 cm/s AV Area (VTI):     2.36 cm     PV VTI:         0.114 m AV Vmax:           63.10 cm/s   PV Peak grad:   3.0 mmHg AV Vmean:          47.500 cm/s  PV Mean grad:   2.0 mmHg AV VTI:            0.082 m      RVOT Peak grad: 3 mmHg AV Peak Grad:      1.6 mmHg AV Mean Grad:      1.0 mmHg LVOT Vmax:         45.00 cm/s LVOT Vmean:        31.300 cm/s LVOT VTI:          0.062 m LVOT/AV VTI ratio: 0.75  AORTA Ao Root diam: 3.20 cm MITRAL VALVE               TRICUSPID VALVE MV Area (PHT): 3.40 cm    TR Peak grad:   29.8 mmHg MV Decel Time: 223 msec    TR Vmax:        273.00 cm/s MV E velocity: 64.70 cm/s                            SHUNTS                            Systemic VTI:  0.06 m                            Systemic Diam: 2.00 cm                            Pulmonic VTI:  0.092 m [employer] Electronically signed by Neoma Laming Signature Date/Time: 01/08/2022/4:13:53 PM    Final      Medications:    furosemide (LASIX) 200 mg in dextrose 5 % 100 mL (2 mg/mL) infusion 6 mg/hr (01/08/22 1501)   magnesium sulfate bolus IVPB      apixaban  5 mg Oral BID   levothyroxine  25 mcg Oral QAC breakfast   losartan  25 mg Oral Daily   metoprolol succinate  25 mg Oral Daily   risperiDONE  0.5 mg Oral QHS   sodium chloride flush  3 mL Intravenous Q12H    Assessment/ Plan:     Active Problems:   Hypothyroidism   Advance care planning   Atrial fibrillation ([place])   Ischemic cardiomyopathy   Hyponatremia with excess extracellular fluid volume   Acute on chronic systolic CHF (congestive heart failure) (HCC)   Fall   Acute respiratory failure with hypoxia ([place])  88 year old white male with history of congestive heart failure,  hypertension, paroxysmal atrial fibrillation, coronary artery disease, hypothyroidism now s/p angioplasty being admitted for congestive heart failure.  #1: Congestive heart failure: Patient is on a Lasix drip with marginal urine output.  Breathing has improved somewhat.  Continue Lasix drip at  this time.  #2: Hyponatremia: Hyponatremia is most likely secondary to hypervolemia.  We will continue the Lasix drip.  Patient need to be maintained on fluid restriction to 1 L a day.  #3: Hypokalemia: Continue potassium supplementation.  Hypokalemia secondary to diuresis.  We will continue to monitor closely.  Palliative care in progress.    LOS: [place], [facility] 6/10/20232:12 PM

## 2022-01-09 NOTE — Assessment & Plan Note (Signed)
Continue apixaban as primary prophylaxis for an acute stroke -considering multiple falls this may need to be reconsidered as he may not be a good candidate for Eliquis.  Heart rate controlled with metoprolol.

## 2022-01-09 NOTE — Assessment & Plan Note (Addendum)
Patient has ischemic cardiomyopathy with last known LVEF of 35 to 40% from a 2D echocardiogram which was done in 06/22 Continue Toprol 25 mg p.o. daily.  Repeat echo shows LVEF of 25 to 30%, global hypokinesis and severely decreased LV systolic function, continue Lasix drip per nephrology

## 2022-01-10 DIAGNOSIS — E871 Hypo-osmolality and hyponatremia: Secondary | ICD-10-CM | POA: Diagnosis not present

## 2022-01-10 DIAGNOSIS — I5023 Acute on chronic systolic (congestive) heart failure: Secondary | ICD-10-CM | POA: Diagnosis not present

## 2022-01-10 DIAGNOSIS — E663 Overweight: Secondary | ICD-10-CM

## 2022-01-10 DIAGNOSIS — J9601 Acute respiratory failure with hypoxia: Secondary | ICD-10-CM | POA: Diagnosis not present

## 2022-01-10 LAB — BASIC METABOLIC PANEL
Anion gap: 10 (ref 5–15)
BUN: 16 mg/dL (ref 8–23)
CO2: 31 mmol/L (ref 22–32)
Calcium: 7.9 mg/dL — ABNORMAL LOW (ref 8.9–10.3)
Chloride: 82 mmol/L — ABNORMAL LOW (ref 98–111)
Creatinine, Ser: 0.74 mg/dL (ref 0.61–1.24)
GFR, Estimated: >60 mL/min (ref >60)
Glucose, Bld: 107 mg/dL — ABNORMAL HIGH (ref 70–99)
Potassium: 3 mmol/L — ABNORMAL LOW (ref 3.5–5.1)
Sodium: 123 mmol/L — ABNORMAL LOW (ref 135–145)

## 2022-01-10 LAB — CBC
HCT: 32.3 % — ABNORMAL LOW (ref 39.0–52.0)
Hemoglobin: 10.3 g/dL — ABNORMAL LOW (ref 13.0–17.0)
MCH: 25.6 pg — ABNORMAL LOW (ref 26.0–34.0)
MCHC: 31.9 g/dL (ref 30.0–36.0)
MCV: 80.3 fL (ref 80.0–100.0)
Platelets: 163 10*3/uL (ref 150–400)
RBC: 4.02 MIL/uL — ABNORMAL LOW (ref 4.22–5.81)
RDW: 16.6 % — ABNORMAL HIGH (ref 11.5–15.5)
WBC: 6.7 10*3/uL (ref 4.0–10.5)
nRBC: 0 % (ref 0.0–0.2)

## 2022-01-10 LAB — BRAIN NATRIURETIC PEPTIDE: B Natriuretic Peptide: 331.8 pg/mL — ABNORMAL HIGH (ref 0.0–100.0)

## 2022-01-10 MED ORDER — CHLORHEXIDINE GLUCONATE CLOTH 2 % EX PADS
6.0000 | MEDICATED_PAD | Freq: Every day | CUTANEOUS | Status: DC
  Administered 2022-01-12: 6 via TOPICAL

## 2022-01-10 MED ORDER — ORAL CARE MOUTH RINSE
15.0000 mL | Freq: Two times a day (BID) | OROMUCOSAL | Status: DC
  Administered 2022-01-10 – 2022-01-11 (×3): 15 mL via OROMUCOSAL

## 2022-01-10 MED ORDER — POTASSIUM CHLORIDE CRYS ER 20 MEQ PO TBCR
40.0000 meq | EXTENDED_RELEASE_TABLET | Freq: Once | ORAL | Status: AC
  Administered 2022-01-10: 40 meq via ORAL
  Filled 2022-01-10: qty 2

## 2022-01-10 NOTE — Progress Notes (Signed)
Pt bladder scanned, >600. This will be I/O x3. Previous order in for foley placement if pt unable to void. Order placed for foley.

## 2022-01-10 NOTE — Assessment & Plan Note (Signed)
TSH within normal limits.  Continue Synthroid. 

## 2022-01-10 NOTE — Assessment & Plan Note (Signed)
Palliative care seen.  DNR for now.  Outpatient palliative care.  Overall poor prognosis.   Son is requesting hospice evaluation

## 2022-01-10 NOTE — Progress Notes (Signed)
Patient on lasix drip and complaining of needing to void.   Per epic documentation, UOP 500 mL overnight with a total of 1500 mL for the last 24 hours.   Bladder scan performed showing 1040 mL of urine present in patient's bladder.  Assisted patient to standing position and encouraged patient to void.   Patient able to void 100 mL of urine.   In and out catheter performed and 700 mL of urine output.    Notified MD Lindwood Qua and placed order for bladder scans.

## 2022-01-10 NOTE — Progress Notes (Signed)
SUBJECTIVE: More comfortable today with less shortness of breath.   Vitals:   01/09/22 1929 01/10/22 0018 01/10/22 0429 01/10/22 0749  BP: 105/65 (!) 88/59 (!) 91/52 (!) 83/60  Pulse: (!) 109 82 79 86  Resp: 18 16 16 18   Temp: 97.9 F (36.6 C) 97.8 F (36.6 C) 97.8 F (36.6 C) 98.4 F (36.9 C)  TempSrc:      SpO2: (!) 88% 99% 98% 100%  Weight:      Height:        Intake/Output Summary (Last 24 hours) at 01/10/2022 0901 Last data filed at 01/10/2022 0400 Gross per 24 hour  Intake 0 ml  Output 1300 ml  Net -1300 ml    LABS: Basic Metabolic Panel: Recent Labs    01/09/22 0751 01/10/22 0540  NA 123* 123*  K 3.1* 3.0*  CL 83* 82*  CO2 28 31  GLUCOSE 104* 107*  BUN 18 16  CREATININE 0.78 0.74  CALCIUM 8.2* 7.9*  MG 1.5*  --    Liver Function Tests: No results for input(s): "AST", "ALT", "ALKPHOS", "BILITOT", "PROT", "ALBUMIN" in the last 72 hours. No results for input(s): "LIPASE", "AMYLASE" in the last 72 hours. CBC: Recent Labs    01/09/22 0751 01/10/22 0540  WBC 9.7 6.7  HGB 11.2* 10.3*  HCT 34.3* 32.3*  MCV 80.0 80.3  PLT 188 163   Cardiac Enzymes: No results for input(s): "CKTOTAL", "CKMB", "CKMBINDEX", "TROPONINI" in the last 72 hours. BNP: Invalid input(s): "POCBNP" D-Dimer: No results for input(s): "DDIMER" in the last 72 hours. Hemoglobin A1C: No results for input(s): "HGBA1C" in the last 72 hours. Fasting Lipid Panel: No results for input(s): "CHOL", "HDL", "LDLCALC", "TRIG", "CHOLHDL", "LDLDIRECT" in the last 72 hours. Thyroid Function Tests: No results for input(s): "TSH", "T4TOTAL", "T3FREE", "THYROIDAB" in the last 72 hours.  Invalid input(s): "FREET3" Anemia Panel: No results for input(s): "VITAMINB12", "FOLATE", "FERRITIN", "TIBC", "IRON", "RETICCTPCT" in the last 72 hours.   PHYSICAL EXAM General: Well developed, well nourished, in no acute distress HEENT:  Normocephalic and atramatic Neck:  No JVD.  Lungs: Clear bilaterally to  auscultation and percussion. Heart: HRRR . Normal S1 and S2 without gallops or murmurs.  Abdomen: Bowel sounds are positive, abdomen soft and non-tender  Msk:  Back normal, normal gait. Normal strength and tone for age. Extremities: No clubbing, cyanosis or edema.   Neuro: Alert and oriented X 3. Psych:  Good affect, responds appropriately  TELEMETRY: Sinus rhythm  ASSESSMENT AND PLAN: HFrEF with left ventricular ejection fraction severely depressed about 30% with underlying atrial fibrillation.  Added losartan yesterday and will add spironolactone once BP is improved.  Active Problems:   Hypothyroidism   Advance care planning   Atrial fibrillation (HCC)   Ischemic cardiomyopathy   Hyponatremia with excess extracellular fluid volume   Acute on chronic systolic CHF (congestive heart failure) (HCC)   Fall   Acute respiratory failure with hypoxia (HCC)    Kirk Sanchez A, MD, [facility] 01/10/2022 9:01 AM

## 2022-01-10 NOTE — Progress Notes (Signed)
Patient has been pleasantly confused, easily redirectable, and following commands.  Patient has not made any attempts to get out of bed or out of chair during this RN's shift per bedside safety sitter report.   Safety Sitter order changed to tele-sitter to re-trial if tele-sitter is appropriate for patient due to patient's improvement in following safety plan.

## 2022-01-10 NOTE — Assessment & Plan Note (Addendum)
Diuresed with Lasix drip.  His ejection fraction has been declining on echo this admission Net IO Since Admission: -5,019.62 mL [01/10/22 1553]

## 2022-01-10 NOTE — Assessment & Plan Note (Signed)
Meets criteria BMI greater than 25 

## 2022-01-10 NOTE — Assessment & Plan Note (Signed)
Likely hypervolemic hyponatremia.  Sodium improved from 120->122-> 123, although has been staying at 123 for the past few days. Continue Lasix drip per nephrology

## 2022-01-10 NOTE — TOC Progression Note (Signed)
Transition of Care [facility]) - Progression Note    Patient Details  Name: Kirk Sanchez MRN: [id number] Date of Birth: [birth year]  Transition of Care [facility]) CM/SW Contact  Liliana Cline, LCSW Phone Number: 01/10/2022, 12:32 PM  Clinical Narrative:   PT and OT recommend home health.  Spoke with patient's son [name] who is agreeable to [facility] rec. He denied agency preference. [name] stated patient will be staying with him at DC at [address], [place], [place] Referral made to [place] with [facility].            Expected Discharge Plan and Services                                                 Social Determinants of Health (SDOH) Interventions    Readmission Risk Interventions     No data to display

## 2022-01-10 NOTE — Progress Notes (Signed)
[facility] Progress Note  Patient: Kirk Sanchez    YTK:[id number]  DOA: 01/06/2022    Date of Service: the patient was seen and examined on 01/10/2022  Brief hospital course: 88 y.o. male with past medical history significant for paroxysmal A-fib, chronic systolic CHF, hypertension, hypothyroidism, coronary artery disease status post stent angioplasty admitted on 6/7 for CHF exacerbation and acute respiratory failure.  Cardiology following and patient has continued to receive Lasix via infusion, diuresing over 6 L.  At times, limited due to hypotension.  Assessment and Plan: Assessment and Plan: Acute respiratory failure with hypoxia (HCC) Secondary to acute systolic CHF.  Initially on 4 L, currently down to 2.  Acute on chronic systolic CHF (congestive heart failure) (HCC) Patient presents for evaluation of exertional shortness of breath, bilateral lower extremity swelling and orthopnea.  Continued on Lasix drip.  BNP improved, but still elevated.  Nephrology and cardiology following.  Net IO Since Admission: -5,019.62 mL [01/10/22 1553]  Hyponatremia with excess extracellular fluid volume Likely hypervolemic hyponatremia.  Sodium improved from 120->122-> 123, although has been staying at 123 for the past few days. Continue Lasix drip per nephrology    Atrial fibrillation (HCC) Continue apixaban as primary prophylaxis for an acute stroke -considering multiple falls this may need to be reconsidered as he may not be a good candidate for Eliquis.  Heart rate controlled with metoprolol.   Hypothyroidism TSH within normal limits.  Continue Synthroid.  Overweight (BMI 25.0-29.9) Meets criteria BMI greater than 25  Fall Patient has had multiple falls at home and appears to be very weak and deconditioned by his son. He currently requires a 1 person assist to get around and initially used a rolling walker PT and OT recommends home health and likely 24/7 supervision He also had  unwitnessed fall on 6/8 evening.  No injury per nursing.  Overall poor prognosis.  Palliative care has evaluated him  Ischemic cardiomyopathy Patient has ischemic cardiomyopathy with last known LVEF of 35 to 40% from a 2D echocardiogram which was done in 06/22 Continue Toprol 25 mg p.o. daily.  Repeat echo shows LVEF of 25 to 30%, global hypokinesis and severely decreased LV systolic function, continue Lasix drip per nephrology  Advance care planning Palliative care seen.  DNR for now.  Outpatient palliative care.  Overall poor prognosis.  He does have.'s of agitation especially in the evening.   as needed Haldol.  Safety sitter at bedside         Body mass index is 26.93 kg/m.        Consultants: Cardiolog Palliative care y Nephrology  Procedures: Ejection fraction of 20 to 25%, indeterminant diastolic function.  Moderate mitral regurg  Antimicrobials: None  Code Status: DNR   Subjective: Patient doing okay, complains of being tired.  Objective: Soft blood pressures Vitals:   01/10/22 1157 01/10/22 1523  BP: (!) 89/61 101/64  Pulse: 80 81  Resp: 18 18  Temp: 98.7 F (37.1 C) 98.7 F (37.1 C)  SpO2: 100% 100%    Intake/Output Summary (Last 24 hours) at 01/10/2022 1554 Last data filed at 01/10/2022 1200 Gross per 24 hour  Intake 573.51 ml  Output 2300 ml  Net -1726.49 ml   Filed Weights   01/08/22 0300 01/09/22 0202 01/10/22 1157  Weight: 58.7 kg 77 kg 78 kg   Body mass index is 26.93 kg/m.  Exam:  General: Oriented x1, no acute distress HEENT: Normocephalic, atraumatic, mucous membranes slightly dry Cardiovascular: Irregular rhythm, rate controlled,  2 out of 6 systolic ejection murmur Respiratory: Decreased breath sounds bibasilar Abdomen: Soft, nontender, nondistended, positive bowel sounds Musculoskeletal: No clubbing or cyanosis, trace pitting edema Skin: No skin breaks, tears or lesions Psychiatry: Underlying chronic dementia Neurology: No  focal deficits  Data Reviewed: BNP of 331  Disposition:  Status is: Inpatient   Anticipated discharge date: 6/13  Remaining issues to be resolved so that patient can be discharged:  -Continue diuresis -Home with home health   Family Communication: Left message for son DVT Prophylaxis:  apixaban Everlene Balls) tablet 5 mg    Author: Hollice Espy ,MD 01/10/2022 3:54 PM  To reach On-call, see care teams to locate the attending and reach out via [email or web address]. Between 7PM-7AM, please contact night-coverage If you still have difficulty reaching the attending provider, please page the [facility] (Director on Call) for [facility] on amion for assistance.

## 2022-01-10 NOTE — Assessment & Plan Note (Signed)
Continue apixaban as primary prophylaxis for an acute stroke -considering multiple falls this may need to be reconsidered as he may not be a good candidate for Eliquis.  Unable to give metoprolol due to hypotension

## 2022-01-10 NOTE — Progress Notes (Signed)
Patient has not spontaneously voided since in and out catheterization at 1030.   Bladder scan showing > 700 mL of urine in patient's bladder.   RN asked patient if he felt the urge/need to void and patient stated no.   Notified MD Rito Ehrlich.  MD advised to perform one more in and out catheterization.   On follow up bladder scan, if patient fails to spontaneously void and has retention will plan for indwelling catheter.

## 2022-01-10 NOTE — Progress Notes (Signed)
Patient hypotensive overnight with SBP ranging 88-91. This mornings BP 83/60 (68).  Patient's 1000 medications include metoprolol and cozaar.   Patient awake and up to the chair at this time.  RN rechecked blood pressure now that patient awake, blood pressure reading 75/46 (57). HR currently 87. In review of Epic, heart rate has ranged from the 70's to 100. Notified MD Rito Ehrlich.  MD Rito Ehrlich advised to hold blood pressure medications at this time.

## 2022-01-10 NOTE — Assessment & Plan Note (Signed)
Secondary to acute systolic CHF.  Initially on 4 L, currently down to 2.

## 2022-01-10 NOTE — Progress Notes (Signed)
[facility]  PROGRESS NOTE   Subjective:   Patient is comfortable.  Blood pressure is on the low side.  Was started on losartan yesterday. Urine output has been excellent.  Objective:  Vital signs: Blood pressure (!) 89/61, pulse 80, temperature 98.7 F (37.1 C), resp. rate 18, height 5\' 7"  (1.702 m), weight 77 kg, SpO2 100 %.  Intake/Output Summary (Last 24 hours) at 01/10/2022 1224 Last data filed at 01/10/2022 1022 Gross per 24 hour  Intake 0 ml  Output 2300 ml  Net -2300 ml   Filed Weights   01/07/22 0200 01/08/22 0300 01/09/22 0202  Weight: 81.1 kg 58.7 kg 77 kg     Physical Exam: General:  No acute distress  Head:  Normocephalic, atraumatic. Moist oral mucosal membranes  Eyes:  Anicteric  Neck:  Supple  Lungs:   Clear to auscultation, normal effort  Heart:  S1S2 no rubs  Abdomen:   Soft, nontender, bowel sounds present  Extremities:  peripheral edema.  Neurologic:  Awake, alert, following commands  Skin:  No lesions  Access:     Basic Metabolic Panel: Recent Labs  Lab 01/06/22 1518 01/07/22 0706 01/08/22 0746 01/09/22 0751 01/10/22 0540  NA 120* 122* 123* 123* 123*  K 5.3* 4.4 3.9 3.1* 3.0*  CL 87* 88* 86* 83* 82*  CO2 23 24 26 28 31   GLUCOSE 110* 88 100* 104* 107*  BUN 19 19 20 18 16   CREATININE 0.90 0.80 0.92 0.78 0.74  CALCIUM 9.1 8.5* 8.6* 8.2* 7.9*  MG  --   --   --  1.5*  --     CBC: Recent Labs  Lab 01/06/22 1518 01/07/22 0706 01/08/22 0746 01/09/22 0751 01/10/22 0540  WBC 8.1 7.6 10.7* 9.7 6.7  NEUTROABS 7.0  --   --   --   --   HGB 11.7* 11.0* 11.8* 11.2* 10.3*  HCT 38.0* 34.9* 37.8* 34.3* 32.3*  MCV 83.7 81.7 81.3 80.0 80.3  PLT 240 202 223 188 163     Urinalysis: No results for input(s): "COLORURINE", "LABSPEC", "PHURINE", "GLUCOSEU", "HGBUR", "BILIRUBINUR", "KETONESUR", "PROTEINUR", "UROBILINOGEN", "NITRITE", "LEUKOCYTESUR" in the last 72 hours.  Invalid input(s): "APPERANCEUR"    Imaging: ECHOCARDIOGRAM  COMPLETE  Result Date: 01/08/2022    ECHOCARDIOGRAM REPORT   Patient Name:   Herminio HeadsHOWARD H Broski Date of Exam: 01/08/2022 Medical Rec #:  [id number]      Height:       76.0 in Accession #:    [id number]     Weight:       129.4 lb Date of Birth:  04-Sep-1924     BSA:          1.841 m Patient Age:    88 years       BP:           113/63 mmHg Patient Gender: M              HR:           80 bpm. Exam Location:  [facility] Procedure: 2D Echo, Color Doppler and Cardiac Doppler Indications:     CHF-acute systolic I50.21  History:         Patient has prior history of Echocardiogram examinations, most                  recent 01/11/2021. CHF, Arrythmias:Atrial Fibrillation; Risk                  Factors:Hypertension.  Sonographer:  Cristela Blue Referring Phys:  [id number] VIPUL [facility] Diagnosing Phys: Adrian Blackwater  Sonographer Comments: Suboptimal apical window. IMPRESSIONS  1. Left ventricular ejection fraction, by estimation, is 25 to 30%. The left ventricle has severely decreased function. The left ventricle demonstrates global hypokinesis. The left ventricular internal cavity size was moderately dilated. There is mild concentric left ventricular hypertrophy. Left ventricular diastolic parameters are indeterminate.  2. Right ventricular systolic function is moderately reduced. The right ventricular size is moderately enlarged.  3. Left atrial size was severely dilated.  4. Right atrial size was moderately dilated.  5. The mitral valve is normal in structure. Moderate mitral valve regurgitation. No evidence of mitral stenosis.  6. Tricuspid valve regurgitation is moderate.  7. The aortic valve is normal in structure. Aortic valve regurgitation is mild to moderate. Aortic valve sclerosis/calcification is present, without any evidence of aortic stenosis.  8. The inferior vena cava is normal in size with greater than 50% respiratory variability, suggesting right atrial pressure of 3 mmHg. FINDINGS  Left Ventricle: Left ventricular ejection  fraction, by estimation, is 25 to 30%. The left ventricle has severely decreased function. The left ventricle demonstrates global hypokinesis. The left ventricular internal cavity size was moderately dilated. There is mild concentric left ventricular hypertrophy. Left ventricular diastolic parameters are indeterminate. Right Ventricle: The right ventricular size is moderately enlarged. No increase in right ventricular wall thickness. Right ventricular systolic function is moderately reduced. Left Atrium: Left atrial size was severely dilated. Right Atrium: Right atrial size was moderately dilated. Pericardium: There is no evidence of pericardial effusion. Mitral Valve: The mitral valve is normal in structure. Moderate mitral valve regurgitation. No evidence of mitral valve stenosis. Tricuspid Valve: The tricuspid valve is normal in structure. Tricuspid valve regurgitation is moderate . No evidence of tricuspid stenosis. Aortic Valve: The aortic valve is normal in structure. Aortic valve regurgitation is mild to moderate. Aortic valve sclerosis/calcification is present, without any evidence of aortic stenosis. Aortic valve mean gradient measures 1.0 mmHg. Aortic valve peak gradient measures 1.6 mmHg. Aortic valve area, by VTI measures 2.36 cm. Pulmonic Valve: The pulmonic valve was normal in structure. Pulmonic valve regurgitation is trivial. No evidence of pulmonic stenosis. Aorta: The aortic root is normal in size and structure. Venous: The inferior vena cava is normal in size with greater than 50% respiratory variability, suggesting right atrial pressure of 3 mmHg. IAS/Shunts: No atrial level shunt detected by color flow Doppler.  LEFT VENTRICLE PLAX 2D LVIDd:         4.30 cm LVIDs:         3.90 cm LV PW:         1.80 cm LV IVS:        1.35 cm LVOT diam:     2.00 cm LV SV:         19 LV SV Index:   11 LVOT Area:     3.14 cm  RIGHT VENTRICLE RV Basal diam:  4.40 cm LEFT ATRIUM             Index        RIGHT  ATRIUM           Index LA diam:        3.30 cm 1.79 cm/m   RA Area:     28.10 cm LA Vol (A2C):   78.0 ml 42.37 ml/m  RA Volume:   95.10 ml  51.66 ml/m LA Vol (A4C):   41.0 ml 22.27 ml/m LA Biplane  Vol: 59.5 ml 32.32 ml/m  AORTIC VALVE                    PULMONIC VALVE AV Area (Vmax):    2.24 cm     PV Vmax:        0.86 m/s AV Area (Vmean):   2.07 cm     PV Vmean:       57.200 cm/s AV Area (VTI):     2.36 cm     PV VTI:         0.114 m AV Vmax:           63.10 cm/s   PV Peak grad:   3.0 mmHg AV Vmean:          47.500 cm/s  PV Mean grad:   2.0 mmHg AV VTI:            0.082 m      RVOT Peak grad: 3 mmHg AV Peak Grad:      1.6 mmHg AV Mean Grad:      1.0 mmHg LVOT Vmax:         45.00 cm/s LVOT Vmean:        31.300 cm/s LVOT VTI:          0.062 m LVOT/AV VTI ratio: 0.75  AORTA Ao Root diam: 3.20 cm MITRAL VALVE               TRICUSPID VALVE MV Area (PHT): 3.40 cm    TR Peak grad:   29.8 mmHg MV Decel Time: 223 msec    TR Vmax:        273.00 cm/s MV E velocity: 64.70 cm/s                            SHUNTS                            Systemic VTI:  0.06 m                            Systemic Diam: 2.00 cm                            Pulmonic VTI:  0.092 m Shaukat Khan Electronically signed by Adrian Blackwater Signature Date/Time: 01/08/2022/4:13:53 PM    Final      Medications:    furosemide (LASIX) 200 mg in dextrose 5 % 100 mL (2 mg/mL) infusion 6 mg/hr (01/09/22 2243)    apixaban  5 mg Oral BID   levothyroxine  25 mcg Oral QAC breakfast   losartan  25 mg Oral Daily   metoprolol succinate  25 mg Oral Daily   risperiDONE  0.5 mg Oral QHS   sodium chloride flush  3 mL Intravenous Q12H    Assessment/ Plan:     Active Problems:   Hypothyroidism   Advance care planning   Atrial fibrillation (HCC)   Ischemic cardiomyopathy   Hyponatremia with excess extracellular fluid volume   Acute on chronic systolic CHF (congestive heart failure) (HCC)   Fall   Acute respiratory failure with hypoxia  (HCC)  88 year old white male with history of congestive heart failure, hypertension, paroxysmal atrial fibrillation, coronary artery disease, hypothyroidism now s/p angioplasty being admitted for congestive heart failure.   #1: Congestive heart failure:  Patient is on a Lasix drip with good urine output.  Breathing has improved somewhat. Continue Lasix drip at this time.   #2: Hyponatremia: Hyponatremia is most likely secondary to hypervolemia.  We will continue the Lasix drip.  Patient need to be maintained on fluid restriction to 1 L a day.   #3: Hypokalemia: Continue potassium supplementation.  Hypokalemia secondary to diuresis.  #4: Hypotension: Patient has been on metoprolol and losartan.  Management as per cardiology.   We will continue to monitor closely.  Palliative care in progress. Overall prognosis is guarded.   LOS: 4 Lorain Childes, MD [facility] 6/11/202312:24 PM

## 2022-01-11 DIAGNOSIS — E871 Hypo-osmolality and hyponatremia: Secondary | ICD-10-CM | POA: Diagnosis not present

## 2022-01-11 DIAGNOSIS — Z7189 Other specified counseling: Secondary | ICD-10-CM | POA: Diagnosis not present

## 2022-01-11 DIAGNOSIS — E663 Overweight: Secondary | ICD-10-CM | POA: Diagnosis not present

## 2022-01-11 DIAGNOSIS — J9601 Acute respiratory failure with hypoxia: Secondary | ICD-10-CM | POA: Diagnosis not present

## 2022-01-11 DIAGNOSIS — I5023 Acute on chronic systolic (congestive) heart failure: Secondary | ICD-10-CM | POA: Diagnosis not present

## 2022-01-11 LAB — CBC
HCT: 35.6 % — ABNORMAL LOW (ref 39.0–52.0)
Hemoglobin: 11.3 g/dL — ABNORMAL LOW (ref 13.0–17.0)
MCH: 25.9 pg — ABNORMAL LOW (ref 26.0–34.0)
MCHC: 31.7 g/dL (ref 30.0–36.0)
MCV: 81.5 fL (ref 80.0–100.0)
Platelets: 216 10*3/uL (ref 150–400)
RBC: 4.37 MIL/uL (ref 4.22–5.81)
RDW: 17 % — ABNORMAL HIGH (ref 11.5–15.5)
WBC: 9.1 10*3/uL (ref 4.0–10.5)
nRBC: 0 % (ref 0.0–0.2)

## 2022-01-11 LAB — BASIC METABOLIC PANEL
Anion gap: 13 (ref 5–15)
BUN: 18 mg/dL (ref 8–23)
CO2: 31 mmol/L (ref 22–32)
Calcium: 8 mg/dL — ABNORMAL LOW (ref 8.9–10.3)
Chloride: 80 mmol/L — ABNORMAL LOW (ref 98–111)
Creatinine, Ser: 0.64 mg/dL (ref 0.61–1.24)
GFR, Estimated: >60 mL/min (ref >60)
Glucose, Bld: 93 mg/dL (ref 70–99)
Potassium: 3.2 mmol/L — ABNORMAL LOW (ref 3.5–5.1)
Sodium: 124 mmol/L — ABNORMAL LOW (ref 135–145)

## 2022-01-11 LAB — MAGNESIUM: Magnesium: 1.5 mg/dL — ABNORMAL LOW (ref 1.7–2.4)

## 2022-01-11 MED ORDER — ENSURE MAX PROTEIN PO LIQD
11.0000 [oz_av] | Freq: Two times a day (BID) | ORAL | Status: DC
  Administered 2022-01-11 (×2): 11 [oz_av] via ORAL
  Filled 2022-01-11: qty 330

## 2022-01-11 MED ORDER — POTASSIUM CHLORIDE CRYS ER 20 MEQ PO TBCR
40.0000 meq | EXTENDED_RELEASE_TABLET | Freq: Once | ORAL | Status: AC
  Administered 2022-01-11: 40 meq via ORAL
  Filled 2022-01-11: qty 2

## 2022-01-11 MED ORDER — MAGNESIUM SULFATE 2 GM/50ML IV SOLN
2.0000 g | Freq: Once | INTRAVENOUS | Status: AC
Start: 2022-01-11 — End: 2022-01-11
  Administered 2022-01-11: 2 g via INTRAVENOUS
  Filled 2022-01-11: qty 50

## 2022-01-11 NOTE — Progress Notes (Signed)
Pts BP low / 87/45/ pt asymptomatic/ MD made aware/ ordered to hold losartan and metoprolol/ will continue to monitor.

## 2022-01-11 NOTE — Progress Notes (Signed)
[facility]  PROGRESS NOTE   Subjective:   Patient seen sitting up in chair Remains confused, yelling out Breakfast tray at bedside Edema greatly improved  Urine output 1.8L in past 24 hours  Objective:  Vital signs: Blood pressure (!) 84/55, pulse 97, temperature 97.8 F (36.6 C), temperature source Axillary, resp. rate 18, height 5\' 7"  (1.702 m), weight 75.8 kg, SpO2 92 %.  Intake/Output Summary (Last 24 hours) at 01/11/2022 1525 Last data filed at 01/11/2022 1043 Gross per 24 hour  Intake 287.23 ml  Output 800 ml  Net -512.77 ml    Filed Weights   01/09/22 0202 01/10/22 1157 01/11/22 0500  Weight: 77 kg 78 kg 75.8 kg     Physical Exam: General:  No acute distress  Head:  Normocephalic, atraumatic. Moist oral mucosal membranes  Eyes:  Anicteric  Neck:  Supple  Lungs:   Clear to auscultation, normal effort  Heart:  S1S2 no rubs  Abdomen:   Soft, nontender, bowel sounds present  Extremities:  1+ peripheral edema.  Neurologic:  Awake, alert, disoriented, unable to follow commands  Skin:  No lesions  Access: None    Basic Metabolic Panel: Recent Labs  Lab 01/07/22 0706 01/08/22 0746 01/09/22 0751 01/10/22 0540 01/11/22 0543  NA 122* 123* 123* 123* 124*  K 4.4 3.9 3.1* 3.0* 3.2*  CL 88* 86* 83* 82* 80*  CO2 24 26 28 31 31   GLUCOSE 88 100* 104* 107* 93  BUN 19 20 18 16 18   CREATININE 0.80 0.92 0.78 0.74 0.64  CALCIUM 8.5* 8.6* 8.2* 7.9* 8.0*  MG  --   --  1.5*  --  1.5*     CBC: Recent Labs  Lab 01/06/22 1518 01/07/22 0706 01/08/22 0746 01/09/22 0751 01/10/22 0540 01/11/22 0543  WBC 8.1 7.6 10.7* 9.7 6.7 9.1  NEUTROABS 7.0  --   --   --   --   --   HGB 11.7* 11.0* 11.8* 11.2* 10.3* 11.3*  HCT 38.0* 34.9* 37.8* 34.3* 32.3* 35.6*  MCV 83.7 81.7 81.3 80.0 80.3 81.5  PLT 240 202 223 188 163 216      Urinalysis: No results for input(s): "COLORURINE", "LABSPEC", "PHURINE", "GLUCOSEU", "HGBUR", "BILIRUBINUR", "KETONESUR",  "PROTEINUR", "UROBILINOGEN", "NITRITE", "LEUKOCYTESUR" in the last 72 hours.  Invalid input(s): "APPERANCEUR"    Imaging: No results found.   Medications:      apixaban  5 mg Oral BID   Chlorhexidine Gluconate Cloth  6 each Topical Q0600   levothyroxine  25 mcg Oral QAC breakfast   mouth rinse  15 mL Mouth Rinse BID   Ensure Max Protein  11 oz Oral BID   risperiDONE  0.5 mg Oral QHS   sodium chloride flush  3 mL Intravenous Q12H    Assessment/ Plan:     Active Problems:   Hypothyroidism   Advance care planning   Atrial fibrillation (HCC)   Hyponatremia with excess extracellular fluid volume   Acute on chronic systolic congestive heart failure (HCC)   Fall   Acute respiratory failure with hypoxia (HCC)   Overweight (BMI 25.0-29.63)  88 year old white male with history of congestive heart failure, hypertension, paroxysmal atrial fibrillation, coronary artery disease, hypothyroidism now s/p angioplasty being admitted for congestive heart failure.   #1: Congestive heart failure: Initially placed on furosemide drip. Good urine output remains with improved edema.    #2: Hyponatremia: Hyponatremia is most likely secondary to hypervolemia.  Continue fluid restriction to 1 L a day.   #  3: Hypokalemia: Continue potassium supplementation. Ordered ensure protein supplement  #4: Hypotension: Home regimen of metoprolol and losartan. All held at this time. Management as per cardiology.    LOS: 5 [facility] 6/12/20233:25 PM

## 2022-01-11 NOTE — Progress Notes (Signed)
Occupational Therapy Treatment Patient Details Name: Kirk Sanchez MRN: [id number] DOB: 1924-08-23 Today's Date: 01/11/2022   History of present illness Kirk Sanchez is a 88 y.o. male with medical history significant for paroxysmal A-fib, chronic systolic CHF, hypertension, hypothyroidism, coronary artery disease status post stent angioplasty who presents to the ER for evaluation of bilateral lower extremity swelling, exertional shortness of breath and orthopnea for 3 days.  Patient's son states that patient used to be ambulatory with a cane and over the last 1 month started using a rolling walker within the last couple of days has been very weak and requires a 1 person assist to get around the house.  He has also had frequent falls. MD assessment includes: Acute respiratory failure with hypoxia, acute on chronic systolic CHF, hyponatremia with excess extracellular fluid volume, and ischemic cardiomyopathy.   OT comments  Kirk Sanchez presents with generalized weakness and lethargy that impacts his ability to safely complete self care tasks.  Session limited this date by pt's fatigue and hard of hearing.  He followed one-step commands intermittently, despite OT providing visual cues (writing questions down) and tactile cues to engage.  OT provided 2-3 verbal and tactile cues for patient to wash face while seated.  He declined further ADL engagement this session.  Per PT report, patient required total assist for transfers earlier today.  OT provided education and verbal cues to support re-orientation, but patient was minimally receptive.  Goals updated this date given increased assistance needed for transfers, as well as limited engagement in ADLs and therapeutic activity this date.  Kirk Sanchez will continue to benefit from skilled OT services at this time to support functional strengthening, safety, and independence in ADLs.  Recommend SNF upon discharge.   Recommendations for follow up therapy are one  component of a multi-disciplinary discharge planning process, led by the attending physician.  Recommendations may be updated based on patient status, additional functional criteria and insurance authorization.    Follow Up Recommendations  Skilled nursing-short term rehab (<3 hours/day)    Assistance Recommended at Discharge Frequent or constant Supervision/Assistance  Patient can return home with the following  A lot of help with bathing/dressing/bathroom;Assistance with cooking/housework;Help with stairs or ramp for entrance;Two people to help with walking and/or transfers;Direct supervision/assist for medications management;Assist for transportation   Equipment Recommendations  BSC/3in1    Recommendations for Other Services      Precautions / Restrictions Precautions Precautions: Fall Restrictions Weight Bearing Restrictions: No       Mobility Bed Mobility Overal bed mobility: Needs Assistance                  Transfers Overall transfer level: Needs assistance                       Balance Overall balance assessment: Needs assistance, History of Falls                                         ADL either performed or assessed with clinical judgement   ADL Overall ADL's : Needs assistance/impaired                                       General ADL Comments: Able to wash face with 2-3 verbal and tactile cues,  unable to assess further ADLs due to [facility], lethargy    Extremity/Trunk Assessment Upper Extremity Assessment Upper Extremity Assessment: Generalized weakness   Lower Extremity Assessment Lower Extremity Assessment: Generalized weakness        Vision Baseline Vision/History: 1 Wears glasses Patient Visual Report: No change from baseline     Perception     Praxis      Cognition Arousal/Alertness: Lethargic Behavior During Therapy: Flat affect Overall Cognitive Status: No family/caregiver present to  determine baseline cognitive functioning                                          Exercises Other Exercises Other Exercises: provided min verbal cues to wash face, education to support reorientation    Shoulder Instructions       General Comments      Pertinent Vitals/ Pain       Pain Assessment Pain Assessment: No/denies pain  Home Living                                          Prior Functioning/Environment              Frequency  Min 2X/week        Progress Toward Goals  OT Goals(current goals can now be found in the care plan section)  Progress towards OT goals: Goals drowngraded-see care plan  Acute Rehab OT Goals Patient Stated Goal: pt unable to participate in goal setting OT Goal Formulation: With patient Time For Goal Achievement: 01/21/22 Potential to Achieve Goals: Fair  Plan Discharge plan remains appropriate;Frequency remains appropriate    Co-evaluation                 AM-PAC OT "6 Clicks" Daily Activity     Outcome Measure   Help from another person eating meals?: None Help from another person taking care of personal grooming?: A Little Help from another person toileting, which includes using toliet, bedpan, or urinal?: A Lot Help from another person bathing (including washing, rinsing, drying)?: A Lot Help from another person to put on and taking off regular upper body clothing?: A Little Help from another person to put on and taking off regular lower body clothing?: A Lot 6 Click Score: 16    End of Session    OT Visit Diagnosis: Other abnormalities of gait and mobility (R26.89);Muscle weakness (generalized) (M62.81)   Activity Tolerance Patient limited by lethargy   Patient Left with call bell/phone within reach;in chair;with nursing/sitter in room;with chair alarm set   Nurse Communication          Time: [phone] OT Time Calculation (min): 9 min  Charges: OT General Charges $OT  Visit: 1 Visit OT Treatments $Self Care/Home Management : 8-22 mins  Dennison Nancy, OTR/L 01/11/22, 12:55 PM

## 2022-01-11 NOTE — Progress Notes (Signed)
Kirk Sanchez       Patient ID: Kirk Sanchez MRN: [id number] DOB/AGE: [birth year] 88 y.o.  Admit date: 01/06/2022 Referring Physician Dr. Max Sane Primary Physician Dr. Steele Sizer  Primary Cardiologist Dr. Clayborn Bigness Reason for Consultation acute on chronic CHF  HPI: Kirk Sanchez is a [id number] with a PMH of HFrEF (LVEF 25-30%, global hypo-, mild concentric LV hypertrophy, moderate MR 01/08/2022), ischemic cardiomyopathy, paroxyxmal AF on eliquis, CAD - MI s/p PCI with BMS to LAD 1996, hypertension, hyperlipidemia, hypothyroidism who presented to [facility] ED 01/06/2022 from his PCPs office with decline in overall health x2 weeks, inability to ambulate due to "weak legs", shortness of breath, weight gain, and not taking his home Lasix.  He was hyponatremic (from hypervolemia) and was started on a a lasix drip per nephrology on admission. Cardiology is consulted for assistance with his heart failure.  Interval History:  -BNP down from 580 on admission to 331 today, net -5.7 L thus far  -awake and eating breakfast, extremely hard of hearing and speech is difficult to understand -hypotensive this morning, holding losartan and metoprolol    Review of systems incomplete due to patient's hearing difficulty     Past Medical History:  Diagnosis Date   Atrial fibrillation (HCC)    CHF (congestive heart failure) ([place])    Hyperlipidemia    Hypertension    Kidney stone    Presbycusis of both ears 03/06/2016   Thyroid disease     Past Surgical History:  Procedure Laterality Date   HAND ARTHROPLASTY     HIP ARTHROPLASTY     STENT PLACEMENT RT URETER ([place] HX)      Medications Prior to Admission  Medication Sig Dispense Refill Last Dose   apixaban (ELIQUIS) 5 MG TABS tablet Take 1 tablet (5 mg total) by mouth 2 (two) times daily. 180 tablet 3 01/06/2022   furosemide (LASIX) 20 MG tablet Take 1 tablet (20 mg total) by mouth daily as needed. Increase sob , weight gain or  swelling 30 tablet 0 01/06/2022   levothyroxine (SYNTHROID) 25 MCG tablet Take 1 tablet (25 mcg total) by mouth daily before breakfast. 90 tablet 3 01/06/2022   metoprolol succinate (TOPROL-XL) 25 MG 24 hr tablet Take 25 mg by mouth daily.   01/05/2022   potassium chloride SA (KLOR-CON M) 20 MEQ tablet Take 1 tablet (20 mEq total) by mouth daily as needed. Only when taking furosemide 30 tablet 3 01/06/2022   SHINGRIX injection USE AS DIRECTED. (Patient not taking: Reported on 01/06/2022) 0.5 each 0    simvastatin (ZOCOR) 40 MG tablet TAKE 1 TABLET BY MOUTH AT BEDTIME (Patient not taking: Reported on 12/07/2021) 90 tablet 1    Social History   Socioeconomic History   Marital status: Widowed    Spouse name: Not on file   Number of children: 3   Years of education: Not on file   Highest education level: High school graduate  Occupational History   Occupation: Retired  Tobacco Use   Smoking status: Never   Smokeless tobacco: Never  Vaping Use   Vaping Use: Never used  Substance and Sexual Activity   Alcohol use: No    Alcohol/week: 0.0 standard drinks of alcohol   Drug use: No   Sexual activity: Not Currently  Other Topics Concern   Not on file  Social History Narrative   Pt lives alone but currently staying with a friend or his son Chrissie Noa since last hospital admission  Social Determinants of Health   Financial Resource Strain: Low Risk  (07/16/2021)   Overall Financial Resource Strain (CARDIA)    Difficulty of Paying Living Expenses: Not hard at all  Food Insecurity: No Food Insecurity (07/16/2021)   Hunger Vital Sign    Worried About Running Out of Food in the Last Year: Never true    Ran Out of Food in the Last Year: Never true  Transportation Needs: No Transportation Needs (07/16/2021)   PRAPARE - Hydrologist (Medical): No    Lack of Transportation (Non-Medical): No  Physical Activity: Inactive (07/16/2021)   Exercise Vital Sign    Days of Exercise  per Week: 0 days    Minutes of Exercise per Session: 0 min  Stress: No Stress Concern Present (07/16/2021)   [place]    Feeling of Stress : Not at all  Social Connections: Moderately Isolated (07/16/2021)   Social Connection and Isolation Panel [NHANES]    Frequency of Communication with Friends and Family: More than three times a week    Frequency of Social Gatherings with Friends and Family: More than three times a week    Attends Religious Services: More than 4 times per year    Active Member of [employer] or Organizations: No    Attends Archivist Meetings: Never    Marital Status: Widowed  Intimate Partner Violence: Not At Risk (07/16/2021)   Humiliation, Afraid, Rape, and Kick questionnaire    Fear of Current or Ex-Partner: No    Emotionally Abused: No    Physically Abused: No    Sexually Abused: No    Family History  Problem Relation Age of Onset   Cancer Mother        stomach   Asthma Father    Stroke Neg Hx    Heart disease Neg Hx       PHYSICAL EXAM General: elderly and frail appearing caucasian male , well nourished, in no acute distress. Sitting upright in PCU bed eating breakfast HEENT:  Normocephalic and atraumatic. Extremely hard of hearing Neck:  No JVD.  Lungs: Normal respiratory effort on oxygen by nasal cannula. Clear bilaterally to auscultation anteriorly. No wheezes, crackles, rhonchi.  Heart: irregular irregular rate and rhythm . Normal S1 and S2 without gallops or murmurs.  Abdomen: Non-distended appearing.  Msk: Normal strength and tone for age. Extremities: Warm and well perfused. No clubbing, cyanosis. 2* left pedal and trace right LE edema.  Neuro: Awake, alert.  Psych:  Answers questions appropriately, but speech is difficult to understand  Labs:   Lab Results  Component Value Date   WBC 9.1 01/11/2022   HGB 11.3 (L) 01/11/2022   HCT 35.6 (L) 01/11/2022   MCV 81.5  01/11/2022   PLT 216 01/11/2022    Recent Labs  Lab 01/06/22 1518 01/07/22 0706 01/11/22 0543  NA 120*   < > 124*  K 5.3*   < > 3.2*  CL 87*   < > 80*  CO2 23   < > 31  BUN 19   < > 18  CREATININE 0.90   < > 0.64  CALCIUM 9.1   < > 8.0*  PROT 7.0  --   --   BILITOT 1.7*  --   --   ALKPHOS 85  --   --   ALT 24  --   --   AST 65*  --   --  GLUCOSE 110*   < > 93   < > = values in this interval not displayed.   Lab Results  Component Value Date   TROPONINI 0.08 (HH) 09/11/2017    Lab Results  Component Value Date   CHOL 188 01/08/2021   CHOL 140 12/05/2020   CHOL 150 11/01/2019   Lab Results  Component Value Date   HDL 67 01/08/2021   HDL 57 12/05/2020   HDL 57 11/01/2019   Lab Results  Component Value Date   LDLCALC 105 (H) 01/08/2021   LDLCALC 64 12/05/2020   LDLCALC 69 11/01/2019   Lab Results  Component Value Date   TRIG 80 01/08/2021   TRIG 106 12/05/2020   TRIG 159 (H) 11/01/2019   Lab Results  Component Value Date   CHOLHDL 2.8 01/08/2021   CHOLHDL 2.5 12/05/2020   CHOLHDL 2.6 11/01/2019   No results found for: "LDLDIRECT"    Radiology: ECHOCARDIOGRAM COMPLETE  Result Date: 01/08/2022    ECHOCARDIOGRAM REPORT   Patient Name:   Kirk Sanchez Date of Exam: 01/08/2022 Medical Rec #:  [id number]      Height:       76.0 in Accession #:    [id number]     Weight:       129.4 lb Date of Birth:  Jul 10, 1925     BSA:          1.841 m Patient Age:    64 years       BP:           113/63 mmHg Patient Gender: M              HR:           80 bpm. Exam Location:  [facility] Procedure: 2D Echo, Color Doppler and Cardiac Doppler Indications:     CHF-acute systolic [id number]  History:         Patient has prior history of Echocardiogram examinations, most                  recent 01/11/2021. CHF, Arrythmias:Atrial Fibrillation; Risk                  Factors:Hypertension.  Sonographer:     Sherrie Sport Referring Phys:  [place] Diagnosing Phys: Neoma Laming  Sonographer Comments:  Suboptimal apical window. IMPRESSIONS  1. Left ventricular ejection fraction, by estimation, is 25 to 30%. The left ventricle has severely decreased function. The left ventricle demonstrates global hypokinesis. The left ventricular internal cavity size was moderately dilated. There is mild concentric left ventricular hypertrophy. Left ventricular diastolic parameters are indeterminate.  2. Right ventricular systolic function is moderately reduced. The right ventricular size is moderately enlarged.  3. Left atrial size was severely dilated.  4. Right atrial size was moderately dilated.  5. The mitral valve is normal in structure. Moderate mitral valve regurgitation. No evidence of mitral stenosis.  6. Tricuspid valve regurgitation is moderate.  7. The aortic valve is normal in structure. Aortic valve regurgitation is mild to moderate. Aortic valve sclerosis/calcification is present, without any evidence of aortic stenosis.  8. The inferior vena cava is normal in size with greater than 50% respiratory variability, suggesting right atrial pressure of 3 mmHg. FINDINGS  Left Ventricle: Left ventricular ejection fraction, by estimation, is 25 to 30%. The left ventricle has severely decreased function. The left ventricle demonstrates global hypokinesis. The left ventricular internal cavity size was moderately dilated. There is mild concentric left ventricular  hypertrophy. Left ventricular diastolic parameters are indeterminate. Right Ventricle: The right ventricular size is moderately enlarged. No increase in right ventricular wall thickness. Right ventricular systolic function is moderately reduced. Left Atrium: Left atrial size was severely dilated. Right Atrium: Right atrial size was moderately dilated. Pericardium: There is no evidence of pericardial effusion. Mitral Valve: The mitral valve is normal in structure. Moderate mitral valve regurgitation. No evidence of mitral valve stenosis. Tricuspid Valve: The tricuspid  valve is normal in structure. Tricuspid valve regurgitation is moderate . No evidence of tricuspid stenosis. Aortic Valve: The aortic valve is normal in structure. Aortic valve regurgitation is mild to moderate. Aortic valve sclerosis/calcification is present, without any evidence of aortic stenosis. Aortic valve mean gradient measures 1.0 mmHg. Aortic valve peak gradient measures 1.6 mmHg. Aortic valve area, by VTI measures 2.36 cm. Pulmonic Valve: The pulmonic valve was normal in structure. Pulmonic valve regurgitation is trivial. No evidence of pulmonic stenosis. Aorta: The aortic root is normal in size and structure. Venous: The inferior vena cava is normal in size with greater than 50% respiratory variability, suggesting right atrial pressure of 3 mmHg. IAS/Shunts: No atrial level shunt detected by color flow Doppler.  LEFT VENTRICLE PLAX 2D LVIDd:         4.30 cm LVIDs:         3.90 cm LV PW:         1.80 cm LV IVS:        1.35 cm LVOT diam:     2.00 cm LV SV:         19 LV SV Index:   11 LVOT Area:     3.14 cm  RIGHT VENTRICLE RV Basal diam:  4.40 cm LEFT ATRIUM             Index        RIGHT ATRIUM           Index LA diam:        3.30 cm 1.79 cm/m   RA Area:     28.10 cm LA Vol (A2C):   78.0 ml 42.37 ml/m  RA Volume:   95.10 ml  51.66 ml/m LA Vol (A4C):   41.0 ml 22.27 ml/m LA Biplane Vol: 59.5 ml 32.32 ml/m  AORTIC VALVE                    PULMONIC VALVE AV Area (Vmax):    2.24 cm     PV Vmax:        0.86 m/s AV Area (Vmean):   2.07 cm     PV Vmean:       57.200 cm/s AV Area (VTI):     2.36 cm     PV VTI:         0.114 m AV Vmax:           63.10 cm/s   PV Peak grad:   3.0 mmHg AV Vmean:          47.500 cm/s  PV Mean grad:   2.0 mmHg AV VTI:            0.082 m      RVOT Peak grad: 3 mmHg AV Peak Grad:      1.6 mmHg AV Mean Grad:      1.0 mmHg LVOT Vmax:         45.00 cm/s LVOT Vmean:        31.300 cm/s LVOT VTI:  0.062 m LVOT/AV VTI ratio: 0.75  AORTA Ao Root diam: 3.20 cm MITRAL VALVE                TRICUSPID VALVE MV Area (PHT): 3.40 cm    TR Peak grad:   29.8 mmHg MV Decel Time: 223 msec    TR Vmax:        273.00 cm/s MV E velocity: 64.70 cm/s                            SHUNTS                            Systemic VTI:  0.06 m                            Systemic Diam: 2.00 cm                            Pulmonic VTI:  0.092 m Neoma Laming Electronically signed by Neoma Laming Signature Date/Time: 01/08/2022/4:13:53 PM    Final    US Venous Img Lower Bilateral (DVT)  Result Date: 01/07/2022 CLINICAL DATA:  Lower extremity swelling x3 days. EXAM: BILATERAL LOWER EXTREMITY VENOUS DOPPLER ULTRASOUND TECHNIQUE: Gray-scale sonography with graded compression, as well as color Doppler and duplex ultrasound were performed to evaluate the lower extremity deep venous systems from the level of the common femoral vein and including the common femoral, femoral, profunda femoral, popliteal and calf veins including the posterior tibial, peroneal and gastrocnemius veins when visible. The superficial great saphenous vein was also interrogated. Spectral Doppler was utilized to evaluate flow at rest and with distal augmentation maneuvers in the common femoral, femoral and popliteal veins. COMPARISON:  Lower extremity venous duplex, 01/10/2021 - POSITIVE for isolated DVT within the LEFT peroneal vein. FINDINGS: Suboptimal evaluation, with poor acoustic penetration secondary to patient habitus. RIGHT LOWER EXTREMITY VENOUS Normal compressibility of the RIGHT common femoral, superficial femoral, and popliteal veins, as well as the visualized portions of the calf veins. Visualized portions of profunda femoral vein and great saphenous vein unremarkable. No filling defects to suggest DVT on grayscale or color Doppler imaging. Doppler waveforms show normal direction of venous flow, normal respiratory plasticity and response to augmentation. OTHER Eccentric, echogenic wall foci at the proximal RIGHT femoral vein. This appears  to have been present on recent comparison (01/2021) study. No evidence of superficial thrombophlebitis or abnormal fluid collection. Limitations: Patient body habitus. Unable to visualize the calf veins. LEFT LOWER EXTREMITY VENOUS Normal compressibility of the LEFT common femoral, superficial femoral, and popliteal veins, as well as the visualized portions of the calf veins. Visualized portions of profunda femoral vein and great saphenous vein unremarkable. No filling defects to suggest DVT on grayscale or color Doppler imaging. Doppler waveforms show normal direction of venous flow, normal respiratory plasticity and response to augmentation. OTHER No evidence of superficial thrombophlebitis or abnormal fluid collection. Limitations: Patient body habitus. Unable to visualize the calf veins. IMPRESSION: Suboptimal evaluation, within these constraints; 1. No evidence of femoropopliteal DVT within either lower extremity. 2. Limited visualization of the bilateral lower extremity calf veins. Electronically Signed   By: Michaelle Birks M.D.   On: 01/07/2022 09:50   DG Chest 2 View  Result Date: 01/06/2022 CLINICAL DATA:  Pleural effusions and bilateral leg swelling. EXAM: CHEST -  2 VIEW COMPARISON:  Chest CT 01/14/2021 FINDINGS: The heart is enlarged but stable. Moderate tortuosity and calcification of the thoracic aorta. Mild chronic underlying lung changes. There are small bilateral pleural effusions and peripheral Kerley B lines suggesting mild interstitial edema. Streaky bibasilar atelectasis without definite infiltrates. The bony thorax is intact. IMPRESSION: Cardiac enlargement, mild interstitial edema, small bilateral pleural effusions and bibasilar atelectasis. Electronically Signed   By: Marijo Sanes M.D.   On: 01/06/2022 15:42    ECHO 01/08/2022  1. Left ventricular ejection fraction, by estimation, is 25 to 30%. The  left ventricle has severely decreased function. The left ventricle  demonstrates global  hypokinesis. The left ventricular internal cavity size  was moderately dilated. There is mild  concentric left ventricular hypertrophy. Left ventricular diastolic  parameters are indeterminate.   2. Right ventricular systolic function is moderately reduced. The right  ventricular size is moderately enlarged.   3. Left atrial size was severely dilated.   4. Right atrial size was moderately dilated.   5. The mitral valve is normal in structure. Moderate mitral valve  regurgitation. No evidence of mitral stenosis.   6. Tricuspid valve regurgitation is moderate.   7. The aortic valve is normal in structure. Aortic valve regurgitation is  mild to moderate. Aortic valve sclerosis/calcification is present, without  any evidence of aortic stenosis.   8. The inferior vena cava is normal in size with greater than 50%  respiratory variability, suggesting right atrial pressure of 3 mmHg.   TELEMETRY reviewed by me: atrial fibrillation rate low 100s, transient elevations to 120s  EKG reviewed by me: Atrial flutter rate 97, LVH  ASSESSMENT AND PLAN:  Apollo Macher is a 61yoM with a PMH of HFrEF (LVEF 25-30%, global hypo-, mild concentric LV hypertrophy, moderate MR 01/08/2022), ischemic cardiomyopathy, paroxyxmal AF on eliquis, CAD - MI s/p PCI with BMS to LAD 1996, hypertension, hyperlipidemia, hypothyroidism who presented to [facility] ED 01/06/2022 from his PCPs office with decline in overall health x2 weeks, inability to ambulate due to "weak legs", shortness of breath, weight gain, and not taking his home Lasix.  He was hyponatremic (from hypervolemia) and was started on a a lasix drip per nephrology on admission. Cardiology is consulted for assistance with his heart failure.  #Acute on chronic HFrEF (LVEF 25-30%) The patient presented from his PCPs office with multiple concerns of generalized decline in his health x2 weeks, not taking his medications including his Lasix.  His weight was up 8 pounds at his PCPs  office.  BNP on admission was 580, improving to 331 on hospital day 5. Remains clinically volume overloaded appearing and on supplemental oxygen. -Continue Lasix drip, appreciate nephrology assistance -Continue GDMT with metoprolol XL 25 mg once daily and losartan 25 mg once daily (added by nephrology) as BP allows  -Per chart review, had previously been on Entresto and metoprolol but   was discontinued 11/2021 due to dizziness and relative hypotension -Strict I's/O  #Paroxysmal atrial fibrillation Remains in atrial fibrillation, rate mostly in the low 100s -Continue metoprolol for rate control as above -On Eliquis 5 mg twice daily, has had frequent falls at home and in the hospital during this admission, CHADSVASC >2 -monitor and replete electrolytes -Agree moving forward might not be a great candidate for long term AC due to his frequent falls  #CAD, s/p PCI with BMS to the LAD 1996 Continue simvastatin 40 mg daily  This patient's plan of care was discussed and created with Dr. Clayborn Bigness and  he is in agreement.  Signed: Tristan Schroeder , PA-C 01/11/2022, 9:06 AM [facility] Cardiology

## 2022-01-11 NOTE — Progress Notes (Signed)
[facility] Liaison Note:  Referral received from Attending Physician Dr. Max Sane for family interest in [facility] hospice services at home. TOC Mariea Clonts aware.  Writer met at bedside with patient and his son Chrissie Noa to initiate education regarding hospice services, philosophy and team approach to care with understanding voiced. Questions answered.  DME needs discussed and the following has been ordered for delivery tomorrow: Hospital bed with top 1/2 rails, over bed table and oxygen. Patient is currenlty on 2 liters and does not have oxygen in the home.  Hospice information and contact numbers given to [facility].  Patient will require EMS non emergent transport home.  Please have signed out of facility DNR in place in discharge packet.  Attending and TOC have been updated.  Liaison to follow through discharge. Please call with any hospice related questions/needs.  Thank you for the opportunity to be involved in the care of this patient and his famikly.  Flo Shanks BSN, RN, [place] [phone]

## 2022-01-11 NOTE — TOC Progression Note (Addendum)
Transition of Care [facility]) - Progression Note    Patient Details  Name: Kirk Sanchez MRN: [id number] Date of Birth: 08-12-1924  [facility] CM/SW Contact  Truddie Hidden, RN Phone Number: 01/11/2022, 3:13 PM  Clinical Narrative:    Referral sent to Dayna Barker of [facility] per MD request.   4:08pm Hospice referral accepted by [facility]. Covering hospice liaison will order DME, and oxygen. Per Clydie Braun, patient will discharge to his son's address at [address] [place], [place]. Likely discharge planned for tomorrow.         Expected Discharge Plan and Services                                                 Social Determinants of Health (SDOH) Interventions    Readmission Risk Interventions     No data to display

## 2022-01-11 NOTE — Assessment & Plan Note (Signed)
Patient has had multiple falls at home and appears to be very weak and deconditioned by his son. He is now requiring 2 person assist while here. he was requiring 1 person assist to get around and initially used a rolling walker at home PT and OT now recommending SNF He also had unwitnessed fall on 6/8 evening.  No injury per nursing.  Overall poor prognosis.  Son/HCPOA leaning towards comfort care and hospice at home per my discussion today

## 2022-01-11 NOTE — Progress Notes (Signed)
Progress Note   Patient: Kirk Sanchez:[id number] DOB: 1925-04-16 DOA: 01/06/2022     5 DOS: the patient was seen and examined on 01/11/2022   Brief hospital course: 88 y.o. male with past medical history significant for paroxysmal A-fib, chronic systolic CHF, hypertension, hypothyroidism, coronary artery disease status post stent angioplasty admitted on 6/7 for CHF exacerbation and acute respiratory failure.  Cardiology following and patient has continued to receive Lasix via infusion, diuresing over 6 L.  At times, limited due to hypotension.  6/12: Goals of care conversation with son who is agreeable for hospice at home   Assessment and Plan: Acute respiratory failure with hypoxia (HCC) Secondary to acute systolic CHF.  Initially on 4 L, currently down to 2  Acute on chronic systolic congestive heart failure (HCC) Diuresed with Lasix drip.  His ejection fraction has been declining on echo this admission Net IO Since Admission: -5,019.62 mL [01/10/22 1553]  Hyponatremia with excess extracellular fluid volume Likely hypervolemic hyponatremia.  Sodium improved from 120-> 124, will stop Lasix drip at this point as patient is getting very hypotensive and sodium has not changed much.  Son is considering comfort care and hospice at home   Atrial fibrillation [facility]) Continue apixaban as primary prophylaxis for an acute stroke -considering multiple falls this may need to be reconsidered as he may not be a good candidate for Eliquis.  Unable to give metoprolol due to hypotension  Hypothyroidism TSH within normal limits.  Continue Synthroid  Overweight (BMI 25.0-29.9) Meets criteria BMI greater than 25.  Fall Patient has had multiple falls at home and appears to be very weak and deconditioned by his son. He is now requiring 2 person assist while here. he was requiring 1 person assist to get around and initially used a rolling walker at home PT and OT now recommending SNF He also had  unwitnessed fall on 6/8 evening.  No injury per nursing.  Overall poor prognosis.  Son/HCPOA leaning towards comfort care and hospice at home per my discussion today  Ischemic cardiomyopathy Patient has ischemic cardiomyopathy with last known LVEF of 35 to 40% from a 2D echocardiogram which was done in 06/22 Continue Toprol 25 mg p.o. daily.  Repeat echo shows LVEF of 25 to 30%, global hypokinesis and severely decreased LV systolic function, continue Lasix drip per nephrology  Advance care planning Palliative care seen.  DNR for now.  Outpatient palliative care.  Overall poor prognosis.   Son is requesting hospice evaluation         Subjective: Seems confused and very hard of hearing.  Hypotensive  Physical Exam: Vitals:   01/11/22 0448 01/11/22 0500 01/11/22 0900 01/11/22 1137  BP: 136/83  (!) 87/45 (!) 84/55  Pulse: (!) 102  91 (!) 47  Resp: 18  18 18   Temp: 97.8 F (36.6 C)  97.6 F (36.4 C)   TempSrc:   Axillary   SpO2: 98%  91% 92%  Weight:  75.8 kg    Height:       . General: Oriented x1, no acute distress . HEENT: Normocephalic, atraumatic, mucous membranes slightly dry . Cardiovascular: Irregular rhythm, rate controlled, 2 out of 6 systolic ejection murmur . Respiratory: Decreased breath sounds bibasilar . Abdomen: Soft, nontender, nondistended, positive bowel sounds . Musculoskeletal: No clubbing or cyanosis, trace pitting edema . Skin: No skin breaks, tears or lesions . Psychiatry: Underlying chronic dementia . Neurology: No focal deficits .  Data Reviewed:  Sodium 124, potassium 3.2, magnesium 1.5  Family Communication: Updated son [name] over phone  Disposition: Status is: Inpatient Remains inpatient appropriate because: Waiting for hospice evaluation   Planned Discharge Destination: Home with hospice    DVT prophylaxis-Eliquis Time spent: 35 minutes  Author: Delfino Lovett, MD 01/11/2022 12:42 PM  For on call review [email or web address].

## 2022-01-11 NOTE — IPAL (Signed)
  Interdisciplinary Goals of Care Family Meeting   Date carried out: 01/11/2022  Location of the meeting: Phone conference  Member's involved: Physician and Family Member or next of kin  Durable Power of Attorney or acting medical decision maker: Son Kenneth/HCPOA  Discussion: We discussed goals of care for ISAAH FURRY is a 88 y.o. male with past medical history significant for paroxysmal A-fib, chronic systolic CHF, hypertension, hypothyroidism, coronary artery disease status post stent angioplasty admitted on 6/7 for CHF exacerbation and acute hypoxic respiratory failure.  Patient has had significant clinical and functional decline or last several months.  He also had several falls.  He is severely hyponatremic on admission and has been hypotensive while here.  The biggest challenge has been his behavioral outburst especially in the evening hours.  Code status: Full DNR  Disposition: Home with Hospice  Time spent for the meeting: 35 minutes    Delfino Lovett, MD  01/11/2022, 12:25 PM

## 2022-01-11 NOTE — Progress Notes (Signed)
Physical Therapy Treatment Patient Details Name: Kirk Sanchez MRN: [id number] DOB: 12-Oct-1924 Today's Date: 01/11/2022   History of Present Illness Kirk Sanchez is a 88 y.o. male with medical history significant for paroxysmal A-fib, chronic systolic CHF, hypertension, hypothyroidism, coronary artery disease status post stent angioplasty who presents to the ER for evaluation of bilateral lower extremity swelling, exertional shortness of breath and orthopnea for 3 days.  Patient's son states that patient used to be ambulatory with a cane and over the last 1 month started using a rolling walker within the last couple of days has been very weak and requires a 1 person assist to get around the house.  He has also had frequent falls. MD assessment includes: Acute respiratory failure with hypoxia, acute on chronic systolic CHF, hyponatremia with excess extracellular fluid volume, and ischemic cardiomyopathy.    PT Comments    Pt lethargic and communicated very sparingly during the session even with written questions and commands provided to address pt's HOH.  Pt required extensive physical assist and cuing for sequencing during transfer training and once is standing presented with heavy posterior lean that he was unable to correct. Pt was given max +2 assist to bring his weight anteriorly in standing but would immediately revert to heavy posterior lean once assist was reduced.  Pt is at an extremely high risk for falls and would not be safe to return to his prior living situation at this time. Pt will benefit from PT services in a SNF setting upon discharge to safely address deficits listed in patient problem list for decreased caregiver assistance and eventual return to PLOF.   Recommendations for follow up therapy are one component of a multi-disciplinary discharge planning process, led by the attending physician.  Recommendations may be updated based on patient status, additional functional criteria  and insurance authorization.  Follow Up Recommendations  Skilled nursing-short term rehab (<3 hours/day)     Assistance Recommended at Discharge Frequent or constant Supervision/Assistance  Patient can return home with the following Two people to help with walking and/or transfers;A lot of help with bathing/dressing/bathroom;Assistance with cooking/housework;Direct supervision/assist for medications management;Assist for transportation;Help with stairs or ramp for entrance   Equipment Recommendations  None recommended by PT    Recommendations for Other Services       Precautions / Restrictions Precautions Precautions: Fall Restrictions Weight Bearing Restrictions: No     Mobility  Bed Mobility               General bed mobility comments: NT, pt in recliner    Transfers Overall transfer level: Needs assistance Equipment used: Rolling walker (2 wheels) Transfers: Sit to/from Stand Sit to Stand: Total assist, +2 physical assistance           General transfer comment: Pt required total assist to come to standing and to remain in standing with assist for hand and foot placement    Ambulation/Gait               General Gait Details: Pt unable to advance either LE this session   Stairs             Wheelchair Mobility    Modified Rankin (Stroke Patients Only)       Balance Overall balance assessment: Needs assistance, History of Falls   Sitting balance-Leahy Scale: Poor     Standing balance support: Bilateral upper extremity supported, During functional activity Standing balance-Leahy Scale: Zero Standing balance comment: Heavy posterior lean in standing  Cognition Arousal/Alertness: Lethargic Behavior During Therapy: Flat affect Overall Cognitive Status: No family/caregiver present to determine baseline cognitive functioning                                          Exercises  Other Exercises Other Exercises: Sit to/from stand transfer training Other Exercises: Anterior weight shifting in sitting to facilitate improved transfer sequencing    General Comments        Pertinent Vitals/Pain Pain Assessment Pain Assessment: PAINAD Breathing: normal Negative Vocalization: none Facial Expression: smiling or inexpressive Body Language: relaxed Consolability: no need to console PAINAD Score: 0 Pain Intervention(s): Monitored during session    Home Living                          Prior Function            PT Goals (current goals can now be found in the care plan section) Progress towards PT goals: Not progressing toward goals - comment (Decrease in functional mobility and gait this session)    Frequency    Min 2X/week      PT Plan Discharge plan needs to be updated    Co-evaluation              AM-PAC PT "6 Clicks" Mobility   Outcome Measure  Help needed turning from your back to your side while in a flat bed without using bedrails?: Total Help needed moving from lying on your back to sitting on the side of a flat bed without using bedrails?: Total Help needed moving to and from a bed to a chair (including a wheelchair)?: Total Help needed standing up from a chair using your arms (e.g., wheelchair or bedside chair)?: Total Help needed to walk in hospital room?: Total Help needed climbing 3-5 steps with a railing? : Total 6 Click Score: 6    End of Session Equipment Utilized During Treatment: Oxygen;Gait Sanchez Activity Tolerance: Patient tolerated treatment well Patient left: in chair;with call bell/phone within reach;with chair alarm set;with nursing/sitter in room Nurse Communication: Mobility status PT Visit Diagnosis: Unsteadiness on feet (R26.81);Other abnormalities of gait and mobility (R26.89);Muscle weakness (generalized) (M62.81);Difficulty in walking, not elsewhere classified (R26.2);History of falling (Z91.81)      Time: 7062-3762 PT Time Calculation (min) (ACUTE ONLY): 16 min  Charges:  $Therapeutic Activity: 8-22 mins                     D. Scott Maxine Fredman PT, DPT 01/11/22, 11:55 AM

## 2022-01-12 DIAGNOSIS — I480 Paroxysmal atrial fibrillation: Secondary | ICD-10-CM | POA: Diagnosis not present

## 2022-01-12 DIAGNOSIS — J9601 Acute respiratory failure with hypoxia: Secondary | ICD-10-CM | POA: Diagnosis not present

## 2022-01-12 DIAGNOSIS — Z515 Encounter for palliative care: Secondary | ICD-10-CM

## 2022-01-12 DIAGNOSIS — E871 Hypo-osmolality and hyponatremia: Secondary | ICD-10-CM | POA: Diagnosis not present

## 2022-01-12 DIAGNOSIS — I5023 Acute on chronic systolic (congestive) heart failure: Secondary | ICD-10-CM | POA: Diagnosis not present

## 2022-01-12 MED ORDER — MORPHINE SULFATE (CONCENTRATE) 10 MG /0.5 ML PO SOLN: 5.0000 mg | ORAL | 0 refills | Status: AC | PRN

## 2022-01-12 MED ORDER — LORAZEPAM 0.5 MG PO TABS: 0.5000 mg | ORAL_TABLET | Freq: Three times a day (TID) | ORAL | 0 refills | Status: AC | PRN

## 2022-01-12 NOTE — Progress Notes (Signed)
PT Cancellation Note  Patient Details Name: Kirk Sanchez MRN: [id number] DOB: January 09, 1925   Cancelled Treatment:    Reason Eval/Treat Not Completed: Other (comment): PT orders completed per MD request secondary to pt discharging under hospice care.     Ovidio Hanger PT, DPT 01/12/22, 11:51 AM

## 2022-01-12 NOTE — Progress Notes (Signed)
[facility] [address] [facility])   Consent forms have been completed.  EMS notified of patient D/C and transport arranged for 12:30p. TOC/[place] and Attending Physician/Dr. Sherryll Burger also notified of transport arrangement.    Please send signed DNR form with patient and RN call report to [phone].    Odette Fraction, MSW [facility] Liaison [phone]

## 2022-01-12 NOTE — Progress Notes (Signed)
Patient discharge to hospice via [facility] transport. Patient IV and Foley left in place as per hospice request. Report given to [place]. No concerns voiced at discharge.

## 2022-01-12 NOTE — Progress Notes (Signed)
[place] NOTE       Patient ID: Kirk Sanchez MRN: [id number] DOB/AGE: [birth year] 88 y.o.  Admit date: 01/06/2022 Referring Physician Dr. Max Sane Primary Physician Dr. Steele Sizer  Primary Cardiologist Dr. Clayborn Bigness Reason for Consultation acute on chronic CHF  HPI: Kirk Sanchez is a [phone] with a PMH of HFrEF (LVEF 25-30%, global hypo-, mild concentric LV hypertrophy, moderate MR 01/08/2022), ischemic cardiomyopathy, paroxyxmal AF on eliquis, CAD - MI s/p PCI with BMS to LAD 1996, hypertension, hyperlipidemia, hypothyroidism who presented to [facility] ED 01/06/2022 from his PCPs office with decline in overall health x2 weeks, inability to ambulate due to "weak legs", shortness of breath, weight gain, and not taking his home Lasix.  He was hyponatremic (from hypervolemia) and was started on a a lasix drip per nephrology on admission. Cardiology is consulted for assistance with his heart failure.  Interval History:  -remains confused and agitated overnight, very weak needing significant assistance with PT/OT -still relatively hypotensive off of losartan & metoprolol  -net negative 6.5L total since admission, weaned off supplemental O2, lasix drip stopped  -sleeping with mouth open this morning  Review of systems unable to assess due to patient's hearing difficulty and somnolence    Past Medical History:  Diagnosis Date   Atrial fibrillation (HCC)    CHF (congestive heart failure) ([place])    Hyperlipidemia    Hypertension    Kidney stone    Presbycusis of both ears 03/06/2016   Thyroid disease     Past Surgical History:  Procedure Laterality Date   HAND ARTHROPLASTY     HIP ARTHROPLASTY     STENT PLACEMENT RT URETER ([place] HX)      Medications Prior to Admission  Medication Sig Dispense Refill Last Dose   apixaban (ELIQUIS) 5 MG TABS tablet Take 1 tablet (5 mg total) by mouth 2 (two) times daily. 180 tablet 3 01/06/2022   furosemide (LASIX) 20 MG tablet Take  1 tablet (20 mg total) by mouth daily as needed. Increase sob , weight gain or swelling 30 tablet 0 01/06/2022   levothyroxine (SYNTHROID) 25 MCG tablet Take 1 tablet (25 mcg total) by mouth daily before breakfast. 90 tablet 3 01/06/2022   metoprolol succinate (TOPROL-XL) 25 MG 24 hr tablet Take 25 mg by mouth daily.   01/05/2022   potassium chloride SA (KLOR-CON M) 20 MEQ tablet Take 1 tablet (20 mEq total) by mouth daily as needed. Only when taking furosemide 30 tablet 3 01/06/2022   SHINGRIX injection USE AS DIRECTED. (Patient not taking: Reported on 01/06/2022) 0.5 each 0    simvastatin (ZOCOR) 40 MG tablet TAKE 1 TABLET BY MOUTH AT BEDTIME (Patient not taking: Reported on 12/07/2021) 90 tablet 1    Social History   Socioeconomic History   Marital status: Widowed    Spouse name: Not on file   Number of children: 3   Years of education: Not on file   Highest education level: High school graduate  Occupational History   Occupation: Retired  Tobacco Use   Smoking status: Never   Smokeless tobacco: Never  Vaping Use   Vaping Use: Never used  Substance and Sexual Activity   Alcohol use: No    Alcohol/week: 0.0 standard drinks of alcohol   Drug use: No   Sexual activity: Not Currently  Other Topics Concern   Not on file  Social History Narrative   Pt lives alone but currently staying with a friend or his son Chrissie Noa  since last hospital admission   Social Determinants of Health   Financial Resource Strain: Low Risk  (07/16/2021)   Overall Financial Resource Strain (CARDIA)    Difficulty of Paying Living Expenses: Not hard at all  Food Insecurity: No Food Insecurity (07/16/2021)   Hunger Vital Sign    Worried About Running Out of Food in the Last Year: Never true    Ran Out of Food in the Last Year: Never true  Transportation Needs: No Transportation Needs (07/16/2021)   PRAPARE - Hydrologist (Medical): No    Lack of Transportation (Non-Medical): No  Physical  Activity: Inactive (07/16/2021)   Exercise Vital Sign    Days of Exercise per Week: 0 days    Minutes of Exercise per Session: 0 min  Stress: No Stress Concern Present (07/16/2021)   [place]    Feeling of Stress : Not at all  Social Connections: Moderately Isolated (07/16/2021)   Social Connection and Isolation Panel [NHANES]    Frequency of Communication with Friends and Family: More than three times a week    Frequency of Social Gatherings with Friends and Family: More than three times a week    Attends Religious Services: More than 4 times per year    Active Member of [employer] or Organizations: No    Attends Archivist Meetings: Never    Marital Status: Widowed  Intimate Partner Violence: Not At Risk (07/16/2021)   Humiliation, Afraid, Rape, and Kick questionnaire    Fear of Current or Ex-Partner: No    Emotionally Abused: No    Physically Abused: No    Sexually Abused: No    Family History  Problem Relation Age of Onset   Cancer Mother        stomach   Asthma Father    Stroke Neg Hx    Heart disease Neg Hx       PHYSICAL EXAM General: elderly and frail appearing caucasian male, in no acute distress. Laying in PCU bed with eyes closed, sleeping with mouth open  HEENT:  Normocephalic and atraumatic. Extremely hard of hearing Neck:  No JVD.  Lungs: Normal respiratory effort on room air. Coarse breath sounds to ascultation anteriorly Heart: irregular irregular rate and rhythm . Normal S1 and S2 without gallops or murmurs.  Abdomen: Non-distended appearing.  Msk: Moves all extremities spontaneously  Extremities: Warm and well perfused. No clubbing, cyanosis. 1+ lower extremity edema bilaterally  Neuro: sleeping, unable to assess  Psych:  unable to assess due to somnolence  Labs:   Lab Results  Component Value Date   WBC 9.1 01/11/2022   HGB 11.3 (L) 01/11/2022   HCT 35.6 (L) 01/11/2022   MCV  81.5 01/11/2022   PLT 216 01/11/2022    Recent Labs  Lab 01/06/22 1518 01/07/22 0706 01/11/22 0543  NA 120*   < > 124*  K 5.3*   < > 3.2*  CL 87*   < > 80*  CO2 23   < > 31  BUN 19   < > 18  CREATININE 0.90   < > 0.64  CALCIUM 9.1   < > 8.0*  PROT 7.0  --   --   BILITOT 1.7*  --   --   ALKPHOS 85  --   --   ALT 24  --   --   AST 65*  --   --   GLUCOSE 110*   < >  93   < > = values in this interval not displayed.    Lab Results  Component Value Date   TROPONINI 0.08 (HH) 09/11/2017    Lab Results  Component Value Date   CHOL 188 01/08/2021   CHOL 140 12/05/2020   CHOL 150 11/01/2019   Lab Results  Component Value Date   HDL 67 01/08/2021   HDL 57 12/05/2020   HDL 57 11/01/2019   Lab Results  Component Value Date   LDLCALC 105 (H) 01/08/2021   LDLCALC 64 12/05/2020   LDLCALC 69 11/01/2019   Lab Results  Component Value Date   TRIG 80 01/08/2021   TRIG 106 12/05/2020   TRIG 159 (H) 11/01/2019   Lab Results  Component Value Date   CHOLHDL 2.8 01/08/2021   CHOLHDL 2.5 12/05/2020   CHOLHDL 2.6 11/01/2019   No results found for: "LDLDIRECT"    Radiology: ECHOCARDIOGRAM COMPLETE  Result Date: 01/08/2022    ECHOCARDIOGRAM REPORT   Patient Name:   Kirk Sanchez Date of Exam: 01/08/2022 Medical Rec #:  [id number]      Height:       76.0 in Accession #:    [id number]     Weight:       129.4 lb Date of Birth:  Jan 23, 1925     BSA:          1.841 m Patient Age:    73 years       BP:           113/63 mmHg Patient Gender: M              HR:           80 bpm. Exam Location:  [facility] Procedure: 2D Echo, Color Doppler and Cardiac Doppler Indications:     CHF-acute systolic [id number]  History:         Patient has prior history of Echocardiogram examinations, most                  recent 01/11/2021. CHF, Arrythmias:Atrial Fibrillation; Risk                  Factors:Hypertension.  Sonographer:     Sherrie Sport Referring Phys:  [place] Diagnosing Phys: Neoma Laming  Sonographer  Comments: Suboptimal apical window. IMPRESSIONS  1. Left ventricular ejection fraction, by estimation, is 25 to 30%. The left ventricle has severely decreased function. The left ventricle demonstrates global hypokinesis. The left ventricular internal cavity size was moderately dilated. There is mild concentric left ventricular hypertrophy. Left ventricular diastolic parameters are indeterminate.  2. Right ventricular systolic function is moderately reduced. The right ventricular size is moderately enlarged.  3. Left atrial size was severely dilated.  4. Right atrial size was moderately dilated.  5. The mitral valve is normal in structure. Moderate mitral valve regurgitation. No evidence of mitral stenosis.  6. Tricuspid valve regurgitation is moderate.  7. The aortic valve is normal in structure. Aortic valve regurgitation is mild to moderate. Aortic valve sclerosis/calcification is present, without any evidence of aortic stenosis.  8. The inferior vena cava is normal in size with greater than 50% respiratory variability, suggesting right atrial pressure of 3 mmHg. FINDINGS  Left Ventricle: Left ventricular ejection fraction, by estimation, is 25 to 30%. The left ventricle has severely decreased function. The left ventricle demonstrates global hypokinesis. The left ventricular internal cavity size was moderately dilated. There is mild concentric left ventricular hypertrophy. Left ventricular diastolic parameters  are indeterminate. Right Ventricle: The right ventricular size is moderately enlarged. No increase in right ventricular wall thickness. Right ventricular systolic function is moderately reduced. Left Atrium: Left atrial size was severely dilated. Right Atrium: Right atrial size was moderately dilated. Pericardium: There is no evidence of pericardial effusion. Mitral Valve: The mitral valve is normal in structure. Moderate mitral valve regurgitation. No evidence of mitral valve stenosis. Tricuspid Valve: The  tricuspid valve is normal in structure. Tricuspid valve regurgitation is moderate . No evidence of tricuspid stenosis. Aortic Valve: The aortic valve is normal in structure. Aortic valve regurgitation is mild to moderate. Aortic valve sclerosis/calcification is present, without any evidence of aortic stenosis. Aortic valve mean gradient measures 1.0 mmHg. Aortic valve peak gradient measures 1.6 mmHg. Aortic valve area, by VTI measures 2.36 cm. Pulmonic Valve: The pulmonic valve was normal in structure. Pulmonic valve regurgitation is trivial. No evidence of pulmonic stenosis. Aorta: The aortic root is normal in size and structure. Venous: The inferior vena cava is normal in size with greater than 50% respiratory variability, suggesting right atrial pressure of 3 mmHg. IAS/Shunts: No atrial level shunt detected by color flow Doppler.  LEFT VENTRICLE PLAX 2D LVIDd:         4.30 cm LVIDs:         3.90 cm LV PW:         1.80 cm LV IVS:        1.35 cm LVOT diam:     2.00 cm LV SV:         19 LV SV Index:   11 LVOT Area:     3.14 cm  RIGHT VENTRICLE RV Basal diam:  4.40 cm LEFT ATRIUM             Index        RIGHT ATRIUM           Index LA diam:        3.30 cm 1.79 cm/m   RA Area:     28.10 cm LA Vol (A2C):   78.0 ml 42.37 ml/m  RA Volume:   95.10 ml  51.66 ml/m LA Vol (A4C):   41.0 ml 22.27 ml/m LA Biplane Vol: 59.5 ml 32.32 ml/m  AORTIC VALVE                    PULMONIC VALVE AV Area (Vmax):    2.24 cm     PV Vmax:        0.86 m/s AV Area (Vmean):   2.07 cm     PV Vmean:       57.200 cm/s AV Area (VTI):     2.36 cm     PV VTI:         0.114 m AV Vmax:           63.10 cm/s   PV Peak grad:   3.0 mmHg AV Vmean:          47.500 cm/s  PV Mean grad:   2.0 mmHg AV VTI:            0.082 m      RVOT Peak grad: 3 mmHg AV Peak Grad:      1.6 mmHg AV Mean Grad:      1.0 mmHg LVOT Vmax:         45.00 cm/s LVOT Vmean:        31.300 cm/s LVOT VTI:  0.062 m LVOT/AV VTI ratio: 0.75  AORTA Ao Root diam: 3.20 cm MITRAL  VALVE               TRICUSPID VALVE MV Area (PHT): 3.40 cm    TR Peak grad:   29.8 mmHg MV Decel Time: 223 msec    TR Vmax:        273.00 cm/s MV E velocity: 64.70 cm/s                            SHUNTS                            Systemic VTI:  0.06 m                            Systemic Diam: 2.00 cm                            Pulmonic VTI:  0.092 m Neoma Laming Electronically signed by Neoma Laming Signature Date/Time: 01/08/2022/4:13:53 PM    Final    US Venous Img Lower Bilateral (DVT)  Result Date: 01/07/2022 CLINICAL DATA:  Lower extremity swelling x3 days. EXAM: BILATERAL LOWER EXTREMITY VENOUS DOPPLER ULTRASOUND TECHNIQUE: Gray-scale sonography with graded compression, as well as color Doppler and duplex ultrasound were performed to evaluate the lower extremity deep venous systems from the level of the common femoral vein and including the common femoral, femoral, profunda femoral, popliteal and calf veins including the posterior tibial, peroneal and gastrocnemius veins when visible. The superficial great saphenous vein was also interrogated. Spectral Doppler was utilized to evaluate flow at rest and with distal augmentation maneuvers in the common femoral, femoral and popliteal veins. COMPARISON:  Lower extremity venous duplex, 01/10/2021 - POSITIVE for isolated DVT within the LEFT peroneal vein. FINDINGS: Suboptimal evaluation, with poor acoustic penetration secondary to patient habitus. RIGHT LOWER EXTREMITY VENOUS Normal compressibility of the RIGHT common femoral, superficial femoral, and popliteal veins, as well as the visualized portions of the calf veins. Visualized portions of profunda femoral vein and great saphenous vein unremarkable. No filling defects to suggest DVT on grayscale or color Doppler imaging. Doppler waveforms show normal direction of venous flow, normal respiratory plasticity and response to augmentation. OTHER Eccentric, echogenic wall foci at the proximal RIGHT femoral vein. This  appears to have been present on recent comparison (01/2021) study. No evidence of superficial thrombophlebitis or abnormal fluid collection. Limitations: Patient body habitus. Unable to visualize the calf veins. LEFT LOWER EXTREMITY VENOUS Normal compressibility of the LEFT common femoral, superficial femoral, and popliteal veins, as well as the visualized portions of the calf veins. Visualized portions of profunda femoral vein and great saphenous vein unremarkable. No filling defects to suggest DVT on grayscale or color Doppler imaging. Doppler waveforms show normal direction of venous flow, normal respiratory plasticity and response to augmentation. OTHER No evidence of superficial thrombophlebitis or abnormal fluid collection. Limitations: Patient body habitus. Unable to visualize the calf veins. IMPRESSION: Suboptimal evaluation, within these constraints; 1. No evidence of femoropopliteal DVT within either lower extremity. 2. Limited visualization of the bilateral lower extremity calf veins. Electronically Signed   By: Michaelle Birks M.D.   On: 01/07/2022 09:50   DG Chest 2 View  Result Date: 01/06/2022 CLINICAL DATA:  Pleural effusions and bilateral leg swelling. EXAM: CHEST -  2 VIEW COMPARISON:  Chest CT 01/14/2021 FINDINGS: The heart is enlarged but stable. Moderate tortuosity and calcification of the thoracic aorta. Mild chronic underlying lung changes. There are small bilateral pleural effusions and peripheral Kerley B lines suggesting mild interstitial edema. Streaky bibasilar atelectasis without definite infiltrates. The bony thorax is intact. IMPRESSION: Cardiac enlargement, mild interstitial edema, small bilateral pleural effusions and bibasilar atelectasis. Electronically Signed   By: Marijo Sanes M.D.   On: 01/06/2022 15:42    ECHO 01/08/2022  1. Left ventricular ejection fraction, by estimation, is 25 to 30%. The  left ventricle has severely decreased function. The left ventricle  demonstrates  global hypokinesis. The left ventricular internal cavity size  was moderately dilated. There is mild  concentric left ventricular hypertrophy. Left ventricular diastolic  parameters are indeterminate.   2. Right ventricular systolic function is moderately reduced. The right  ventricular size is moderately enlarged.   3. Left atrial size was severely dilated.   4. Right atrial size was moderately dilated.   5. The mitral valve is normal in structure. Moderate mitral valve  regurgitation. No evidence of mitral stenosis.   6. Tricuspid valve regurgitation is moderate.   7. The aortic valve is normal in structure. Aortic valve regurgitation is  mild to moderate. Aortic valve sclerosis/calcification is present, without  any evidence of aortic stenosis.   8. The inferior vena cava is normal in size with greater than 50%  respiratory variability, suggesting right atrial pressure of 3 mmHg.   TELEMETRY reviewed by me: atrial fibrillation rate low 100s, transient elevations to 120s  EKG reviewed by me: Atrial flutter rate 97, LVH  ASSESSMENT AND PLAN:  Kirk Sanchez is a 21yoM with a PMH of HFrEF (LVEF 25-30%, global hypo-, mild concentric LV hypertrophy, moderate MR 01/08/2022), ischemic cardiomyopathy, paroxyxmal AF on eliquis, CAD - MI s/p PCI with BMS to LAD 1996, hypertension, hyperlipidemia, hypothyroidism who presented to [facility] ED 01/06/2022 from his PCPs office with decline in overall health x2 weeks, inability to ambulate due to "weak legs", shortness of breath, weight gain, and not taking his home Lasix.  He was hyponatremic (from hypervolemia) and was started on a a lasix drip per nephrology on admission. Cardiology is consulted for assistance with his heart failure.  #Debility  #Acute on chronic HFrEF (LVEF 25-30%) The patient presented from his PCPs office with multiple concerns of generalized decline in his health x2 weeks, not taking his medications including his Lasix.  His weight was up 8  pounds at his PCPs office.  BNP on admission was 580, improving to 331 on hospital day 5. Remains clinically volume overloaded appearing but off supplemental O2 today -s/p Lasix drip with net cumulative output 6.5L -hold GDMT metoprolol XL 25 mg once daily and losartan 25 mg once daily due to hypotension  -Per chart review, had previously been on Entresto and metoprolol but   was discontinued 11/2021 due to dizziness and relative hypotension -Strict I's/O -appears to overall significantly declined, primary team deciding safest discharge plan (hospice vs hospice home)  #Paroxysmal atrial fibrillation Remains in atrial fibrillation, rate mostly in the low 100s -hold metoprolol d/t hypotension -discontinue Eliquis 5 mg twice daily as he is extremely high risk for recurrent falls - risk outweighs benefit of long term stroke risk reduction at this point, CHADSVASC >2  #CAD, s/p PCI with BMS to the LAD 1996 Continue simvastatin 40 mg daily  This patient's plan of care was discussed and created with Dr. Clayborn Bigness and  he is in agreement.  Signed: Tristan Schroeder , PA-C 01/12/2022, 9:19 AM [facility]

## 2022-01-12 NOTE — Care Management Important Message (Signed)
Important Message  Patient Details  Name: Kirk Sanchez MRN: [id number] Date of Birth: April 21, 1925   Medicare Important Message Given:  Other (see comment)  Patient discharging home with hospice. Out of respect for the patient and family no Important Message from [facility] given.   Olegario Messier A Darryl Blumenstein 01/12/2022, 10:18 AM

## 2022-01-12 NOTE — Discharge Summary (Signed)
Physician Discharge Summary   Patient: Kirk Sanchez MRN: [id number] DOB: 1925/04/06  Admit date:     01/06/2022  Discharge date: 01/12/22  Discharge Physician: Delfino LovettVipul Clovis Mankins   PCP: Alba CorySowles, Krichna, MD   Recommendations at discharge:    Hospice Home  Discharge Diagnoses: Active Problems:   Acute respiratory failure with hypoxia (HCC)   Acute on chronic systolic congestive heart failure (HCC)   Hyponatremia with excess extracellular fluid volume   Atrial fibrillation (HCC)   Hypothyroidism   Overweight (BMI 25.0-29.9)   [facility]   [facility]: [age] y.o. male with past medical history significant for paroxysmal A-fib, chronic systolic CHF, hypertension, hypothyroidism, coronary artery disease status post stent angioplasty admitted on 6/7 for CHF exacerbation and acute respiratory failure.  Cardiology following and patient has continued to receive Lasix via infusion, diuresing over 6 L.  At times, limited due to hypotension.  6/12: Goals of care conversation with son who is agreeable for hospice at home  Assessment and Plan: Acute respiratory failure with hypoxia (HCC) Acute on chronic systolic congestive heart failure (HCC) Hyponatremia with excess extracellular fluid volume Atrial fibrillation (HCC) Hypothyroidism Overweight (BMI 25.0-29.9) Fall Ischemic cardiomyopathy Advance care planning  Overall very poor prognosis < 2 weeks. He is actively dying. Son in agreement with Hospice Home.          Consultants: Palliative care, Cardio, nephro Disposition: Hospice care Diet recommendation:  Discharge Diet Orders (From admission, onward)     Start     Ordered   01/12/22 0000  Diet - low sodium heart healthy        01/12/22 1159           Regular diet DISCHARGE MEDICATION: Allergies as of 01/12/2022   No Known Allergies      Medication List     STOP taking these medications    apixaban 5 MG Tabs tablet Commonly known as: ELIQUIS    furosemide 20 MG tablet Commonly known as: LASIX   metoprolol succinate 25 MG 24 hr tablet Commonly known as: TOPROL-XL   potassium chloride SA 20 MEQ tablet Commonly known as: KLOR-CON M   Shingrix injection Generic drug: Zoster Vaccine Adjuvanted   simvastatin 40 MG tablet Commonly known as: ZOCOR       TAKE these medications    levothyroxine 25 MCG tablet Commonly known as: SYNTHROID Take 1 tablet (25 mcg total) by mouth daily before breakfast.   LORazepam 0.5 MG tablet Commonly known as: Ativan Take 1 tablet (0.5 mg total) by mouth every 8 (eight) hours as needed for anxiety.   morphine CONCENTRATE 10 mg / 0.5 ml concentrated solution Take 0.25 mLs (5 mg total) by mouth every 2 (two) hours as needed for severe pain.        Discharge Exam: Filed Weights   01/10/22 1157 01/11/22 0500 01/12/22 0600  Weight: 78 kg 75.8 kg 75.6 kg   89 y m actively dying Cardiovascular: Irregular rhythm, rate controlled, 2 out of 6 systolic ejection murmur Respiratory: Decreased breath sounds bibasilar Abdomen: Soft, benign Psychiatry: Underlying chronic dementia Neurology: No focal deficits  Condition at discharge: poor  The results of significant diagnostics from this hospitalization (including imaging, microbiology, ancillary and laboratory) are listed below for reference.   Imaging Studies: ECHOCARDIOGRAM COMPLETE  Result Date: 01/08/2022    ECHOCARDIOGRAM REPORT   Patient Name:   Kirk Sanchez Date of Exam: 01/08/2022 Medical Rec #:  [id number]      Height:  76.0 in Accession #:    [id number]     Weight:       129.4 lb Date of Birth:  Aug 24, 1924     BSA:          1.841 m Patient Age:    88 years       BP:           113/63 mmHg Patient Gender: M              HR:           80 bpm. Exam Location:  [facility] Procedure: 2D Echo, Color Doppler and Cardiac Doppler Indications:     CHF-acute systolic I50.21  History:         Patient has prior history of Echocardiogram examinations,  most                  recent 01/11/2021. CHF, Arrythmias:Atrial Fibrillation; Risk                  Factors:Hypertension.  Sonographer:     Cristela Blue Referring Phys:  [id number] Jkwon Treptow [facility] Diagnosing Phys: Adrian Blackwater  Sonographer Comments: Suboptimal apical window. IMPRESSIONS  1. Left ventricular ejection fraction, by estimation, is 25 to 30%. The left ventricle has severely decreased function. The left ventricle demonstrates global hypokinesis. The left ventricular internal cavity size was moderately dilated. There is mild concentric left ventricular hypertrophy. Left ventricular diastolic parameters are indeterminate.  2. Right ventricular systolic function is moderately reduced. The right ventricular size is moderately enlarged.  3. Left atrial size was severely dilated.  4. Right atrial size was moderately dilated.  5. The mitral valve is normal in structure. Moderate mitral valve regurgitation. No evidence of mitral stenosis.  6. Tricuspid valve regurgitation is moderate.  7. The aortic valve is normal in structure. Aortic valve regurgitation is mild to moderate. Aortic valve sclerosis/calcification is present, without any evidence of aortic stenosis.  8. The inferior vena cava is normal in size with greater than 50% respiratory variability, suggesting right atrial pressure of 3 mmHg. FINDINGS  Left Ventricle: Left ventricular ejection fraction, by estimation, is 25 to 30%. The left ventricle has severely decreased function. The left ventricle demonstrates global hypokinesis. The left ventricular internal cavity size was moderately dilated. There is mild concentric left ventricular hypertrophy. Left ventricular diastolic parameters are indeterminate. Right Ventricle: The right ventricular size is moderately enlarged. No increase in right ventricular wall thickness. Right ventricular systolic function is moderately reduced. Left Atrium: Left atrial size was severely dilated. Right Atrium: Right atrial size was  moderately dilated. Pericardium: There is no evidence of pericardial effusion. Mitral Valve: The mitral valve is normal in structure. Moderate mitral valve regurgitation. No evidence of mitral valve stenosis. Tricuspid Valve: The tricuspid valve is normal in structure. Tricuspid valve regurgitation is moderate . No evidence of tricuspid stenosis. Aortic Valve: The aortic valve is normal in structure. Aortic valve regurgitation is mild to moderate. Aortic valve sclerosis/calcification is present, without any evidence of aortic stenosis. Aortic valve mean gradient measures 1.0 mmHg. Aortic valve peak gradient measures 1.6 mmHg. Aortic valve area, by VTI measures 2.36 cm. Pulmonic Valve: The pulmonic valve was normal in structure. Pulmonic valve regurgitation is trivial. No evidence of pulmonic stenosis. Aorta: The aortic root is normal in size and structure. Venous: The inferior vena cava is normal in size with greater than 50% respiratory variability, suggesting right atrial pressure of 3 mmHg. IAS/Shunts: No atrial level shunt detected by color  flow Doppler.  LEFT VENTRICLE PLAX 2D LVIDd:         4.30 cm LVIDs:         3.90 cm LV PW:         1.80 cm LV IVS:        1.35 cm LVOT diam:     2.00 cm LV SV:         19 LV SV Index:   11 LVOT Area:     3.14 cm  RIGHT VENTRICLE RV Basal diam:  4.40 cm LEFT ATRIUM             Index        RIGHT ATRIUM           Index LA diam:        3.30 cm 1.79 cm/m   RA Area:     28.10 cm LA Vol (A2C):   78.0 ml 42.37 ml/m  RA Volume:   95.10 ml  51.66 ml/m LA Vol (A4C):   41.0 ml 22.27 ml/m LA Biplane Vol: 59.5 ml 32.32 ml/m  AORTIC VALVE                    PULMONIC VALVE AV Area (Vmax):    2.24 cm     PV Vmax:        0.86 m/s AV Area (Vmean):   2.07 cm     PV Vmean:       57.200 cm/s AV Area (VTI):     2.36 cm     PV VTI:         0.114 m AV Vmax:           63.10 cm/s   PV Peak grad:   3.0 mmHg AV Vmean:          47.500 cm/s  PV Mean grad:   2.0 mmHg AV VTI:            0.082 m       RVOT Peak grad: 3 mmHg AV Peak Grad:      1.6 mmHg AV Mean Grad:      1.0 mmHg LVOT Vmax:         45.00 cm/s LVOT Vmean:        31.300 cm/s LVOT VTI:          0.062 m LVOT/AV VTI ratio: 0.75  AORTA Ao Root diam: 3.20 cm MITRAL VALVE               TRICUSPID VALVE MV Area (PHT): 3.40 cm    TR Peak grad:   29.8 mmHg MV Decel Time: 223 msec    TR Vmax:        273.00 cm/s MV E velocity: 64.70 cm/s                            SHUNTS                            Systemic VTI:  0.06 m                            Systemic Diam: 2.00 cm                            Pulmonic VTI:  0.092 m [employer] Electronically  signed by Adrian Blackwater Signature Date/Time: 01/08/2022/4:13:53 PM    Final    US Venous Img Lower Bilateral (DVT)  Result Date: 01/07/2022 CLINICAL DATA:  Lower extremity swelling x3 days. EXAM: BILATERAL LOWER EXTREMITY VENOUS DOPPLER ULTRASOUND TECHNIQUE: Gray-scale sonography with graded compression, as well as color Doppler and duplex ultrasound were performed to evaluate the lower extremity deep venous systems from the level of the common femoral vein and including the common femoral, femoral, profunda femoral, popliteal and calf veins including the posterior tibial, peroneal and gastrocnemius veins when visible. The superficial great saphenous vein was also interrogated. Spectral Doppler was utilized to evaluate flow at rest and with distal augmentation maneuvers in the common femoral, femoral and popliteal veins. COMPARISON:  Lower extremity venous duplex, 01/10/2021 - POSITIVE for isolated DVT within the LEFT peroneal vein. FINDINGS: Suboptimal evaluation, with poor acoustic penetration secondary to patient habitus. RIGHT LOWER EXTREMITY VENOUS Normal compressibility of the RIGHT common femoral, superficial femoral, and popliteal veins, as well as the visualized portions of the calf veins. Visualized portions of profunda femoral vein and great saphenous vein unremarkable. No filling defects to suggest DVT on  grayscale or color Doppler imaging. Doppler waveforms show normal direction of venous flow, normal respiratory plasticity and response to augmentation. OTHER Eccentric, echogenic wall foci at the proximal RIGHT femoral vein. This appears to have been present on recent comparison (01/2021) study. No evidence of superficial thrombophlebitis or abnormal fluid collection. Limitations: Patient body habitus. Unable to visualize the calf veins. LEFT LOWER EXTREMITY VENOUS Normal compressibility of the LEFT common femoral, superficial femoral, and popliteal veins, as well as the visualized portions of the calf veins. Visualized portions of profunda femoral vein and great saphenous vein unremarkable. No filling defects to suggest DVT on grayscale or color Doppler imaging. Doppler waveforms show normal direction of venous flow, normal respiratory plasticity and response to augmentation. OTHER No evidence of superficial thrombophlebitis or abnormal fluid collection. Limitations: Patient body habitus. Unable to visualize the calf veins. IMPRESSION: Suboptimal evaluation, within these constraints; 1. No evidence of femoropopliteal DVT within either lower extremity. 2. Limited visualization of the bilateral lower extremity calf veins. Electronically Signed   By: Roanna Banning M.D.   On: 01/07/2022 09:50   DG Chest 2 View  Result Date: 01/06/2022 CLINICAL DATA:  Pleural effusions and bilateral leg swelling. EXAM: CHEST - 2 VIEW COMPARISON:  Chest CT 01/14/2021 FINDINGS: The heart is enlarged but stable. Moderate tortuosity and calcification of the thoracic aorta. Mild chronic underlying lung changes. There are small bilateral pleural effusions and peripheral Kerley B lines suggesting mild interstitial edema. Streaky bibasilar atelectasis without definite infiltrates. The bony thorax is intact. IMPRESSION: Cardiac enlargement, mild interstitial edema, small bilateral pleural effusions and bibasilar atelectasis. Electronically  Signed   By: Rudie Meyer M.D.   On: 01/06/2022 15:42    Microbiology: Results for orders placed or performed during the hospital encounter of 01/06/22  SARS Coronavirus 2 by RT PCR (hospital order, performed in [facility] hospital lab) *cepheid single result test* Anterior Nasal Swab     Status: None   Collection Time: 01/06/22  4:20 PM   Specimen: Anterior Nasal Swab  Result Value Ref Range Status   SARS Coronavirus 2 by RT PCR NEGATIVE NEGATIVE Final    Comment: (NOTE) SARS-CoV-2 target nucleic acids are NOT DETECTED.  The SARS-CoV-2 RNA is generally detectable in upper and lower respiratory specimens during the acute phase of infection. The lowest concentration of SARS-CoV-2 viral copies this assay  can detect is 250 copies / mL. A negative result does not preclude SARS-CoV-2 infection and should not be used as the sole basis for treatment or other patient management decisions.  A negative result may occur with improper specimen collection / handling, submission of specimen other than nasopharyngeal swab, presence of viral mutation(s) within the areas targeted by this assay, and inadequate number of viral copies (<250 copies / mL). A negative result must be combined with clinical observations, patient history, and epidemiological information.  Fact Sheet for Patients:   RoadLapTop.co.za  Fact Sheet for Healthcare Providers: http://kim-miller.com/  This test is not yet approved or  cleared by the [place] FDA and has been authorized for detection and/or diagnosis of SARS-CoV-2 by FDA under an Emergency Use Authorization (EUA).  This EUA will remain in effect (meaning this test can be used) for the duration of the COVID-19 declaration under Section 564(b)(1) of the Act, 21 U.S.C. section 360bbb-3(b)(1), unless the authorization is terminated or revoked sooner.  Performed at [facility], [address]  [place], [place] [address]     Labs: CBC: Recent Labs  Lab 01/06/22 1518 01/07/22 0706 01/08/22 0746 01/09/22 0751 01/10/22 0540 01/11/22 0543  WBC 8.1 7.6 10.7* 9.7 6.7 9.1  NEUTROABS 7.0  --   --   --   --   --   HGB 11.7* 11.0* 11.8* 11.2* 10.3* 11.3*  HCT 38.0* 34.9* 37.8* 34.3* 32.3* 35.6*  MCV 83.7 81.7 81.3 80.0 80.3 81.5  PLT 240 202 223 188 163 216   Basic Metabolic Panel: Recent Labs  Lab 01/07/22 0706 01/08/22 0746 01/09/22 0751 01/10/22 0540 01/11/22 0543  NA 122* 123* 123* 123* 124*  K 4.4 3.9 3.1* 3.0* 3.2*  CL 88* 86* 83* 82* 80*  CO2 24 26 28 31 31   GLUCOSE 88 100* 104* 107* 93  BUN 19 20 18 16 18   CREATININE 0.80 0.92 0.78 0.74 0.64  CALCIUM 8.5* 8.6* 8.2* 7.9* 8.0*  MG  --   --  1.5*  --  1.5*   Liver Function Tests: Recent Labs  Lab 01/06/22 1518  AST 65*  ALT 24  ALKPHOS 85  BILITOT 1.7*  PROT 7.0  ALBUMIN 3.8   CBG: No results for input(s): "GLUCAP" in the last 168 hours.  Discharge time spent: greater than 30 minutes.  Signed: , MD [facility] 01/12/2022

## 2022-01-12 NOTE — TOC Transition Note (Signed)
Transition of Care [facility]) - CM/SW Discharge Note   Patient Details  Name: Kirk Sanchez MRN: [id number] Date of Birth: [birth year]  Transition [facility] CM/SW Contact:  Truddie Hidden, RN Phone Number: 01/12/2022, 12:22 PM   Clinical Narrative:    Discharge plan changed from home with hospice to [facility] home. Per [facility] Liaison patient has been approved for hospice home and will discharge today. Patient son and nurse notified of discharge time. Patient son denies any further question or concerns. EMS packet on chart. TOC signing off.         Patient Goals and CMS Choice        Discharge Placement                       Discharge Plan and Services                                     Social Determinants of Health (SDOH) Interventions     Readmission Risk Interventions     No data to display

## 2022-01-12 NOTE — Progress Notes (Signed)
OT Cancellation Note  Patient Details Name: Kirk Sanchez MRN: [id number] DOB: 04-01-1925   Cancelled Treatment:    Reason Eval/Treat Not Completed: Other (comment). OT orders completed per MD request as pt is going to hospice home.    Jackquline Denmark, MS, OTR/L , CBIS ascom [phone]  01/12/22, 12:08 PM

## 2022-01-28 ENCOUNTER — Ambulatory Visit: Payer: Medicare Other | Admitting: Family

## 2022-01-30 DEATH — deceased

## 2022-04-07 ENCOUNTER — Ambulatory Visit: Payer: Medicare Other | Admitting: Family Medicine

## 2022-07-20 ENCOUNTER — Ambulatory Visit: Payer: Medicare Other
